# Patient Record
Sex: Female | Born: 1950
Health system: Southern US, Community
[De-identification: ages and names within clinical notes are randomized; demographics above are authoritative.]

## PROBLEM LIST (undated history)

## (undated) DIAGNOSIS — R51 Headache: Secondary | ICD-10-CM

## (undated) DIAGNOSIS — F329 Major depressive disorder, single episode, unspecified: Secondary | ICD-10-CM

## (undated) DIAGNOSIS — D649 Anemia, unspecified: Secondary | ICD-10-CM

## (undated) DIAGNOSIS — F32A Depression, unspecified: Secondary | ICD-10-CM

## (undated) DIAGNOSIS — I471 Supraventricular tachycardia, unspecified: Secondary | ICD-10-CM

## (undated) DIAGNOSIS — R011 Cardiac murmur, unspecified: Secondary | ICD-10-CM

## (undated) DIAGNOSIS — R5382 Chronic fatigue, unspecified: Secondary | ICD-10-CM

## (undated) DIAGNOSIS — E559 Vitamin D deficiency, unspecified: Secondary | ICD-10-CM

## (undated) DIAGNOSIS — E890 Postprocedural hypothyroidism: Secondary | ICD-10-CM

## (undated) DIAGNOSIS — R519 Headache, unspecified: Secondary | ICD-10-CM

## (undated) DIAGNOSIS — D333 Benign neoplasm of cranial nerves: Secondary | ICD-10-CM

## (undated) DIAGNOSIS — E041 Nontoxic single thyroid nodule: Secondary | ICD-10-CM

## (undated) DIAGNOSIS — I10 Essential (primary) hypertension: Secondary | ICD-10-CM

## (undated) DIAGNOSIS — I499 Cardiac arrhythmia, unspecified: Secondary | ICD-10-CM

## (undated) DIAGNOSIS — K219 Gastro-esophageal reflux disease without esophagitis: Secondary | ICD-10-CM

## (undated) DIAGNOSIS — R002 Palpitations: Secondary | ICD-10-CM

## (undated) DIAGNOSIS — M1990 Unspecified osteoarthritis, unspecified site: Secondary | ICD-10-CM

## (undated) DIAGNOSIS — G9332 Myalgic encephalomyelitis/chronic fatigue syndrome: Secondary | ICD-10-CM

## (undated) DIAGNOSIS — E0590 Thyrotoxicosis, unspecified without thyrotoxic crisis or storm: Secondary | ICD-10-CM

## (undated) DIAGNOSIS — C44111 Basal cell carcinoma of skin of unspecified eyelid, including canthus: Secondary | ICD-10-CM

## (undated) DIAGNOSIS — E538 Deficiency of other specified B group vitamins: Secondary | ICD-10-CM

## (undated) HISTORY — PX: GASTRIC BYPASS: SHX52

## (undated) HISTORY — DX: Thyrotoxicosis, unspecified without thyrotoxic crisis or storm: E05.90

## (undated) HISTORY — DX: Benign neoplasm of cranial nerves: D33.3

## (undated) HISTORY — DX: Supraventricular tachycardia, unspecified: I47.10

## (undated) HISTORY — DX: Postprocedural hypothyroidism: E89.0

## (undated) HISTORY — PX: REDUCTION MAMMAPLASTY: SUR839

## (undated) HISTORY — DX: Chronic fatigue, unspecified: R53.82

## (undated) HISTORY — DX: Palpitations: R00.2

## (undated) HISTORY — DX: Myalgic encephalomyelitis/chronic fatigue syndrome: G93.32

## (undated) HISTORY — DX: Essential (primary) hypertension: I10

## (undated) HISTORY — DX: Nontoxic single thyroid nodule: E04.1

## (undated) HISTORY — DX: Vitamin D deficiency, unspecified: E55.9

## (undated) HISTORY — DX: Basal cell carcinoma of skin of unspecified eyelid, including canthus: C44.111

## (undated) HISTORY — DX: Deficiency of other specified B group vitamins: E53.8

## (undated) HISTORY — PX: EYE SURGERY: SHX253

## (undated) HISTORY — DX: Supraventricular tachycardia: I47.1

---

## 1988-05-08 HISTORY — PX: BREAST REDUCTION SURGERY: SHX8

## 2003-01-14 LAB — HM COLONOSCOPY

## 2006-07-20 ENCOUNTER — Ambulatory Visit: Payer: Self-pay | Admitting: Urology

## 2006-12-19 ENCOUNTER — Ambulatory Visit: Payer: Self-pay | Admitting: Obstetrics and Gynecology

## 2007-12-17 ENCOUNTER — Ambulatory Visit: Payer: Self-pay | Admitting: Obstetrics and Gynecology

## 2007-12-20 ENCOUNTER — Ambulatory Visit: Payer: Self-pay | Admitting: Obstetrics and Gynecology

## 2008-10-07 ENCOUNTER — Ambulatory Visit: Payer: Self-pay | Admitting: Gastroenterology

## 2009-04-06 ENCOUNTER — Ambulatory Visit: Payer: Self-pay | Admitting: Obstetrics and Gynecology

## 2009-05-14 ENCOUNTER — Ambulatory Visit: Payer: Self-pay | Admitting: Internal Medicine

## 2009-07-06 HISTORY — PX: LAPAROSCOPIC HYSTERECTOMY: SHX1926

## 2010-01-11 ENCOUNTER — Ambulatory Visit: Payer: Self-pay | Admitting: Internal Medicine

## 2010-06-03 ENCOUNTER — Encounter: Payer: Self-pay | Admitting: Cardiovascular Disease

## 2010-06-07 ENCOUNTER — Ambulatory Visit: Payer: Self-pay | Admitting: Internal Medicine

## 2010-06-15 ENCOUNTER — Ambulatory Visit: Payer: Self-pay | Admitting: Internal Medicine

## 2010-08-04 ENCOUNTER — Ambulatory Visit: Payer: Self-pay | Admitting: Unknown Physician Specialty

## 2010-09-12 ENCOUNTER — Encounter: Payer: Self-pay | Admitting: Cardiovascular Disease

## 2010-09-12 ENCOUNTER — Ambulatory Visit: Payer: Self-pay | Admitting: Unknown Physician Specialty

## 2010-09-12 DIAGNOSIS — I10 Essential (primary) hypertension: Secondary | ICD-10-CM

## 2010-09-19 ENCOUNTER — Ambulatory Visit (INDEPENDENT_AMBULATORY_CARE_PROVIDER_SITE_OTHER): Payer: BC Managed Care – PPO | Admitting: Cardiovascular Disease

## 2010-09-19 ENCOUNTER — Encounter: Payer: Self-pay | Admitting: Cardiovascular Disease

## 2010-09-19 VITALS — BP 120/90 | HR 60 | Ht 68.0 in | Wt 175.0 lb

## 2010-09-19 DIAGNOSIS — R9431 Abnormal electrocardiogram [ECG] [EKG]: Secondary | ICD-10-CM

## 2010-09-19 DIAGNOSIS — Z01810 Encounter for preprocedural cardiovascular examination: Secondary | ICD-10-CM | POA: Insufficient documentation

## 2010-09-19 NOTE — Assessment & Plan Note (Signed)
We have reviewed all of her risk factors. She is not a diabetic, no smoking, no significant family history of coronary artery disease. EKG today is normal. She would be acceptable risk. No further testing is needed prior to hysterectomy surgery.

## 2010-09-19 NOTE — Patient Instructions (Signed)
You are doing well. No medication changes were made.  Please call us if you have new issues that need to be addressed before your next appt.  Follow up as needed   

## 2010-09-19 NOTE — Assessment & Plan Note (Signed)
We have reviewed the previous EKG and have repeated her EKG today. Previous concern for old anterior infarct is likely secondary to lead placement. Repeat EKG today is essentially normal with improved R wave progression. She has no other risk factors. She would be an acceptable risk for her upcoming hysterectomy.

## 2010-09-19 NOTE — Progress Notes (Signed)
   Patient ID: Barbara Mclaughlin, female    DOB: 08-14-1950, 60 y.o.   MRN: [id number]  HPI Comments: Barbara Mclaughlin is a very pleasant 60 year old woman, patient of Dr. Dan Humphreys, with history of hypertension, who presents for preoperative evaluation prior to hysterectomy.  She reports that she had a preoperative EKG that was apparently normal. She otherwise feels well, has no significant shortness of breath, no chest pain. She's never had cardiac issues in the past. She reports that her blood pressure has been well-controlled on her current medication management. He denies any significant lower extremity edema.   We did discuss her risk factors. She is not diabetic, she does not have a smoking history. In terms of her family history, she reports that her mother lived into her 85s, father died of COPD in his 13s, siblings are in good health with no underlying coronary artery disease.  EKG done today shows normal sinus rhythm with rate 60 beats per minute with no significant ST or T wave changes.  EKG done in the hospital showed normal sinus rhythm with rate 56 beats per minute with poor R-wave progression through the precordial leads. EKG was read as unable to rule out anterior infarct. Given the improved R wave progression through the precordial leads on today's EKG, this is likely secondary to lead placement.     Review of Systems  Constitutional: Negative.   HENT: Negative.   Eyes: Negative.   Respiratory: Negative.   Cardiovascular: Negative.   Gastrointestinal: Negative.   Musculoskeletal: Negative.   Skin: Negative.   Neurological: Negative.   Hematological: Negative.   Psychiatric/Behavioral: Negative.   All other systems reviewed and are negative.    BP 120/90  Pulse 60  Ht 5\' 8"  (1.727 m)  Wt 175 lb (79.379 kg)  BMI 26.61 kg/m2 She did not take her blood pressure medication today.  Physical Exam  Nursing note and vitals reviewed. Constitutional: She is oriented to  person, place, and time. She appears well-developed and well-nourished.  HENT:  Head: Normocephalic.  Nose: Nose normal.  Mouth/Throat: Oropharynx is clear and moist.  Eyes: Conjunctivae are normal. Pupils are equal, round, and reactive to light.  Neck: Normal range of motion. Neck supple. No JVD present.  Cardiovascular: Normal rate, regular rhythm, S1 normal, S2 normal, normal heart sounds and intact distal pulses.  Exam reveals no gallop and no friction rub.   No murmur heard. Pulmonary/Chest: Effort normal and breath sounds normal. No respiratory distress. She has no wheezes. She has no rales. She exhibits no tenderness.  Abdominal: Soft. Bowel sounds are normal. She exhibits no distension. There is no tenderness.  Musculoskeletal: Normal range of motion. She exhibits no edema and no tenderness.  Lymphadenopathy:    She has no cervical adenopathy.  Neurological: She is alert and oriented to person, place, and time. Coordination normal.  Skin: Skin is warm and dry. No rash noted. No erythema.  Psychiatric: She has a normal mood and affect. Her behavior is normal. Judgment and thought content normal.         Assessment and Plan

## 2010-09-30 ENCOUNTER — Ambulatory Visit: Payer: Self-pay | Admitting: Unknown Physician Specialty

## 2010-12-15 ENCOUNTER — Encounter: Payer: Self-pay | Admitting: Internal Medicine

## 2010-12-30 ENCOUNTER — Other Ambulatory Visit: Payer: Self-pay | Admitting: Internal Medicine

## 2010-12-30 NOTE — Telephone Encounter (Signed)
Pt needs refill on celbrex      [facility] @ [address]   Please advise pt when rx is sent

## 2010-12-31 MED ORDER — CELECOXIB 100 MG PO CAPS: 100.0000 mg | ORAL_CAPSULE | Freq: Every day | ORAL | Status: DC

## 2011-01-04 ENCOUNTER — Other Ambulatory Visit: Payer: Self-pay | Admitting: Internal Medicine

## 2011-01-04 DIAGNOSIS — M1990 Unspecified osteoarthritis, unspecified site: Secondary | ICD-10-CM

## 2011-01-04 MED ORDER — CELECOXIB 100 MG PO CAPS: 100.0000 mg | ORAL_CAPSULE | Freq: Every day | ORAL | Status: DC

## 2011-01-04 NOTE — Telephone Encounter (Signed)
Rx sent to [facility] for Celebrex

## 2011-01-26 ENCOUNTER — Telehealth: Payer: Self-pay | Admitting: Internal Medicine

## 2011-01-26 NOTE — Telephone Encounter (Signed)
Patient picked up samples

## 2011-01-26 NOTE — Telephone Encounter (Signed)
Pt would like to know if we have any samples of micardis 40mg   Please call pt to let her know if have any samples

## 2011-01-26 NOTE — Telephone Encounter (Signed)
Fine to give month supply of Micardis 40mg  daily

## 2011-02-08 ENCOUNTER — Other Ambulatory Visit: Payer: Self-pay | Admitting: Internal Medicine

## 2011-03-07 ENCOUNTER — Other Ambulatory Visit: Payer: Self-pay | Admitting: Internal Medicine

## 2011-03-08 ENCOUNTER — Other Ambulatory Visit: Payer: Self-pay | Admitting: Internal Medicine

## 2011-03-11 ENCOUNTER — Other Ambulatory Visit: Payer: Self-pay | Admitting: Internal Medicine

## 2011-03-15 ENCOUNTER — Telehealth: Payer: Self-pay | Admitting: *Deleted

## 2011-03-15 NOTE — Telephone Encounter (Signed)
Pt left VM - Pain med denied by MD. I called [facility]. They did not receive the RX that I faxed in on Saturday, I called in RF and left pt vm that we automatically deny the RF due it being a controlled substance and send note to pharm that it would be called or faxed in, they only relayed to the patient that it was denied.

## 2011-04-17 ENCOUNTER — Ambulatory Visit (INDEPENDENT_AMBULATORY_CARE_PROVIDER_SITE_OTHER): Payer: BC Managed Care – HMO | Admitting: Internal Medicine

## 2011-04-17 ENCOUNTER — Encounter: Payer: Self-pay | Admitting: Internal Medicine

## 2011-04-17 VITALS — BP 122/76 | HR 64 | Temp 98.2°F | Ht 68.0 in | Wt 183.0 lb

## 2011-04-17 DIAGNOSIS — M129 Arthropathy, unspecified: Secondary | ICD-10-CM

## 2011-04-17 DIAGNOSIS — Z23 Encounter for immunization: Secondary | ICD-10-CM

## 2011-04-17 DIAGNOSIS — M1990 Unspecified osteoarthritis, unspecified site: Secondary | ICD-10-CM | POA: Insufficient documentation

## 2011-04-17 DIAGNOSIS — I10 Essential (primary) hypertension: Secondary | ICD-10-CM

## 2011-04-17 DIAGNOSIS — E663 Overweight: Secondary | ICD-10-CM | POA: Insufficient documentation

## 2011-04-17 DIAGNOSIS — N959 Unspecified menopausal and perimenopausal disorder: Secondary | ICD-10-CM

## 2011-04-17 DIAGNOSIS — K219 Gastro-esophageal reflux disease without esophagitis: Secondary | ICD-10-CM

## 2011-04-17 DIAGNOSIS — F329 Major depressive disorder, single episode, unspecified: Secondary | ICD-10-CM | POA: Insufficient documentation

## 2011-04-17 DIAGNOSIS — Z2911 Encounter for prophylactic immunotherapy for respiratory syncytial virus (RSV): Secondary | ICD-10-CM

## 2011-04-17 LAB — COMPREHENSIVE METABOLIC PANEL
Albumin: 4 g/dL (ref 3.5–5.2)
CO2: 27 mEq/L (ref 19–32)
GFR: 96.66 mL/min (ref >60.00)
Glucose, Bld: 96 mg/dL (ref 70–99)
Potassium: 4 mEq/L (ref 3.5–5.1)
Sodium: 137 mEq/L (ref 135–145)
Total Protein: 6.8 g/dL (ref 6.0–8.3)

## 2011-04-17 MED ORDER — EST ESTROGENS-METHYLTEST 0.625-1.25 MG PO TABS: 1.0000 | ORAL_TABLET | Freq: Every day | ORAL | Status: DC

## 2011-04-17 MED ORDER — PHENTERMINE HCL 37.5 MG PO CAPS: 37.5000 mg | ORAL_CAPSULE | ORAL | Status: DC

## 2011-04-17 MED ORDER — HYDROCODONE-ACETAMINOPHEN 5-325 MG PO TABS: 1.0000 | ORAL_TABLET | Freq: Two times a day (BID) | ORAL | Status: DC

## 2011-04-17 MED ORDER — TELMISARTAN 40 MG PO TABS: 40.0000 mg | ORAL_TABLET | Freq: Every day | ORAL | Status: DC

## 2011-04-17 MED ORDER — ESTRADIOL 2 MG VA RING: 2.0000 mg | VAGINAL_RING | VAGINAL | Status: DC

## 2011-04-17 MED ORDER — CELECOXIB 100 MG PO CAPS: 100.0000 mg | ORAL_CAPSULE | Freq: Every day | ORAL | Status: DC

## 2011-04-17 MED ORDER — FLUOXETINE HCL 20 MG PO CAPS: 40.0000 mg | ORAL_CAPSULE | Freq: Two times a day (BID) | ORAL | Status: DC

## 2011-04-17 MED ORDER — ESOMEPRAZOLE MAGNESIUM 40 MG PO CPDR: 40.0000 mg | DELAYED_RELEASE_CAPSULE | Freq: Every day | ORAL | Status: DC

## 2011-04-17 NOTE — Progress Notes (Signed)
Subjective:    Patient ID: Barbara Mclaughlin, female    DOB: [birth year], 60 y.o.   MRN: [id number]  HPI 60 year old female with history of hypertension presents for followup. She notes that recently she has been having issues with her arthritis. She notes significant pain in her left shoulder and in both of her knees. She has been followed by orthopedics for this and had a steroid injection into her left shoulder with some improvement. She is also scheduled for evaluation for possible knee replacement. She has been using Celebrex with minimal improvement in her symptoms. She notes that her significant arthritis pain limits her ability to exercise.  In regards to her hypertension, she reports full compliance with her medication. She denies any headache, palpitations, or chest pain.  She notes that over the last 6 months she has gained approximately 10 pounds. She attributes this to her inactivity in the setting of severe arthritis pain. Notably, she underwent a gastric bypass surgery several years ago. She has not been keeping a food diary. She does limit her caloric intake.  Outpatient Encounter Prescriptions as of 04/17/2011  Medication Sig Dispense Refill  . bisacodyl (BISACODYL) 5 MG EC tablet Take 5 mg by mouth daily as needed.        . celecoxib (CELEBREX) 100 MG capsule Take 1 capsule (100 mg total) by mouth daily.  90 capsule  3  . esomeprazole (NEXIUM) 40 MG capsule Take 1 capsule (40 mg total) by mouth daily before breakfast.  90 capsule  3  . estradiol (ESTRING) 2 MG vaginal ring Place 2 mg vaginally every 3 (three) months. follow package directions  1 each  3  . estrogen-methylTESTOSTERone (COVARYX HS) 0.625-1.25 MG per tablet Take 1 tablet by mouth daily.  90 tablet  1  . FLUoxetine (PROZAC) 20 MG capsule Take 2 capsules (40 mg total) by mouth 2 (two) times daily.  180 capsule  3  . HYDROcodone-acetaminophen (NORCO) 5-325 MG per tablet Take 1-2 tablets by mouth 2 (two) times daily.   270 tablet  1  . telmisartan (MICARDIS) 40 MG tablet Take 1 tablet (40 mg total) by mouth daily.  90 tablet  3  . phentermine 37.5 MG capsule Take 1 capsule (37.5 mg total) by mouth every morning.  30 capsule  1    Review of Systems  Constitutional: Negative for fever, chills, appetite change, fatigue and unexpected weight change.  HENT: Negative for ear pain, congestion, sore throat, trouble swallowing, neck pain, voice change and sinus pressure.   Eyes: Negative for visual disturbance.  Respiratory: Negative for cough, shortness of breath, wheezing and stridor.   Cardiovascular: Negative for chest pain, palpitations and leg swelling.  Gastrointestinal: Negative for nausea, vomiting, abdominal pain, diarrhea, constipation and abdominal distention.  Genitourinary: Negative for dysuria and flank pain.  Musculoskeletal: Positive for myalgias, back pain and arthralgias. Negative for gait problem.  Skin: Negative for color change and rash.  Neurological: Negative for dizziness and headaches.  Hematological: Negative for adenopathy. Does not bruise/bleed easily.  Psychiatric/Behavioral: Negative for suicidal ideas, sleep disturbance and dysphoric mood. The patient is not nervous/anxious.    BP 122/76  Pulse 64  Temp(Src) 98.2 F (36.8 C) (Oral)  Ht 5\' 8"  (1.727 m)  Wt 183 lb (83.008 kg)  BMI 27.82 kg/m2  SpO2 97%     Objective:   Physical Exam  Constitutional: She is oriented to person, place, and time. She appears well-developed and well-nourished. No distress.  HENT:  Head: Normocephalic  and atraumatic.  Right Ear: External ear normal.  Left Ear: External ear normal.  Nose: Nose normal.  Mouth/Throat: Oropharynx is clear and moist. No oropharyngeal exudate.  Eyes: Conjunctivae are normal. Pupils are equal, round, and reactive to light. Right eye exhibits no discharge. Left eye exhibits no discharge. No scleral icterus.  Neck: Normal range of motion. Neck supple. No tracheal  deviation present. No thyromegaly present.  Cardiovascular: Normal rate, regular rhythm, normal heart sounds and intact distal pulses.  Exam reveals no gallop and no friction rub.   No murmur heard. Pulmonary/Chest: Effort normal and breath sounds normal. No respiratory distress. She has no wheezes. She has no rales. She exhibits no tenderness.  Musculoskeletal: She exhibits no edema and no tenderness.       Left shoulder: She exhibits tenderness and pain.       Right knee: She exhibits decreased range of motion and swelling.  Lymphadenopathy:    She has no cervical adenopathy.  Neurological: She is alert and oriented to person, place, and time. No cranial nerve deficit. She exhibits normal muscle tone. Coordination normal.  Skin: Skin is warm and dry. No rash noted. She is not diaphoretic. No erythema. No pallor.  Psychiatric: She has a normal mood and affect. Her behavior is normal. Judgment and thought content normal.          Assessment & Plan:  1. Hypertension - blood pressure well-controlled on Micardis. Will continue. Will repeat renal function with labs today.  2. Weight gain/Overweight - BMI 27. Encouraged patient to keep a food diary. Encouraged her to try exercises such as swimming which would not be so hard on her joints. We will give her a trial of phentermine to help with appetite suppression. We discussed the potential risk of palpitations and elevated blood pressure. She will call if any questions or concerns. She will followup in one month.  3. Osteoarthritis - she will continue to follow with orthopedics. She will continue Celebrex. Followup in one month.

## 2011-04-21 ENCOUNTER — Other Ambulatory Visit: Payer: Self-pay | Admitting: Internal Medicine

## 2011-04-21 ENCOUNTER — Telehealth: Payer: Self-pay | Admitting: *Deleted

## 2011-04-21 NOTE — Telephone Encounter (Signed)
Pt left VM - [facility] did not received RFs. I refaxed w/cover sheet, need to call patient to inform

## 2011-04-24 MED ORDER — HYDROCODONE-ACETAMINOPHEN 5-325 MG PO TABS: 1.0000 | ORAL_TABLET | Freq: Three times a day (TID) | ORAL | Status: DC | PRN

## 2011-04-24 NOTE — Telephone Encounter (Signed)
That is fine 

## 2011-04-24 NOTE — Telephone Encounter (Signed)
Patient informed, She will be out of hydrocodone before mail order can deliver. OK to call in 14 day supply to local pharm?

## 2011-04-24 NOTE — Telephone Encounter (Signed)
Called into [facility]

## 2011-04-28 ENCOUNTER — Telehealth: Payer: Self-pay | Admitting: *Deleted

## 2011-04-28 NOTE — Telephone Encounter (Signed)
Pt left VM - [facility] did not get RX's that were faxed twice. Need to call pharm Monday and call in RX's

## 2011-05-04 NOTE — Telephone Encounter (Signed)
Called in [place], spent 30 min on phone w/[facility]. Patient informed

## 2011-05-09 HISTORY — PX: TOTAL KNEE ARTHROPLASTY: SHX125

## 2011-05-24 ENCOUNTER — Ambulatory Visit (INDEPENDENT_AMBULATORY_CARE_PROVIDER_SITE_OTHER): Payer: BC Managed Care – HMO | Admitting: Internal Medicine

## 2011-05-24 ENCOUNTER — Encounter: Payer: Self-pay | Admitting: Internal Medicine

## 2011-05-24 DIAGNOSIS — E663 Overweight: Secondary | ICD-10-CM

## 2011-05-24 DIAGNOSIS — M1990 Unspecified osteoarthritis, unspecified site: Secondary | ICD-10-CM

## 2011-05-24 DIAGNOSIS — M129 Arthropathy, unspecified: Secondary | ICD-10-CM

## 2011-05-24 NOTE — Progress Notes (Signed)
Subjective:    Patient ID: Barbara Mclaughlin, female    DOB: Jul 06, 1950, 61 y.o.   MRN: [id number]  HPI 61 year old female with a history of osteoarthritis and obesity presents for followup. In regards to her osteoarthritis, she reports that she is scheduled for right knee replacement later this month. She reports that, at present her pain is well controlled using Celebrex and hydrocodone.  In regards to her obesity, she is status post gastric bypass. Over the last several months she has gained some weight back secondary to being more sedentary as she is limited with her osteoarthritis. Last month, we restarted phentermine to help with appetite suppression. Over the last month, she reports improvement in her appetite. She denies any known side effects from the medication. She denies headache or palpitations. She has lost 1 pound since her last visit.  Outpatient Encounter Prescriptions as of 05/24/2011  Medication Sig Dispense Refill  . bisacodyl (BISACODYL) 5 MG EC tablet Take 5 mg by mouth daily as needed.        . celecoxib (CELEBREX) 100 MG capsule Take 1 capsule (100 mg total) by mouth daily.  90 capsule  3  . esomeprazole (NEXIUM) 40 MG capsule Take 1 capsule (40 mg total) by mouth daily before breakfast.  90 capsule  3  . estradiol (ESTRING) 2 MG vaginal ring Place 2 mg vaginally every 3 (three) months. follow package directions  1 each  3  . estrogen-methylTESTOSTERone (COVARYX HS) 0.625-1.25 MG per tablet Take 1 tablet by mouth daily.  90 tablet  1  . FLUoxetine (PROZAC) 20 MG capsule Take 2 capsules (40 mg total) by mouth 2 (two) times daily.  180 capsule  3  . HYDROcodone-acetaminophen (NORCO) 5-325 MG per tablet Take 1 tablet by mouth 3 (three) times daily as needed.  45 tablet  0  . phentermine 37.5 MG capsule Take 37.5 mg by mouth every morning.      Marland Kitchen telmisartan (MICARDIS) 40 MG tablet Take 1 tablet (40 mg total) by mouth daily.  90 tablet  3    Review of Systems    Constitutional: Negative for fever, chills, appetite change, fatigue and unexpected weight change.  HENT: Negative for ear pain, congestion, sore throat, trouble swallowing, neck pain, voice change and sinus pressure.   Eyes: Negative for visual disturbance.  Respiratory: Negative for cough, shortness of breath, wheezing and stridor.   Cardiovascular: Negative for chest pain, palpitations and leg swelling.  Gastrointestinal: Negative for nausea, vomiting, abdominal pain, diarrhea, constipation, blood in stool, abdominal distention and anal bleeding.  Genitourinary: Negative for dysuria and flank pain.  Musculoskeletal: Negative for myalgias, arthralgias and gait problem.  Skin: Negative for color change and rash.  Neurological: Negative for dizziness and headaches.  Hematological: Negative for adenopathy. Does not bruise/bleed easily.  Psychiatric/Behavioral: Negative for suicidal ideas, sleep disturbance and dysphoric mood. The patient is not nervous/anxious.    BP 140/82  Pulse 85  Temp(Src) 97.7 F (36.5 C) (Oral)  Ht 5\' 8"  (1.727 m)  Wt 182 lb (82.555 kg)  BMI 27.67 kg/m2  SpO2 97%     Objective:   Physical Exam  Constitutional: She is oriented to person, place, and time. She appears well-developed and well-nourished. No distress.  HENT:  Head: Normocephalic and atraumatic.  Right Ear: External ear normal.  Left Ear: External ear normal.  Nose: Nose normal.  Mouth/Throat: Oropharynx is clear and moist. No oropharyngeal exudate.  Eyes: Conjunctivae are normal. Pupils are equal, round, and reactive  to light. Right eye exhibits no discharge. Left eye exhibits no discharge. No scleral icterus.  Neck: Normal range of motion. Neck supple. No tracheal deviation present. No thyromegaly present.  Cardiovascular: Normal rate, regular rhythm, normal heart sounds and intact distal pulses.  Exam reveals no gallop and no friction rub.   No murmur heard. Pulmonary/Chest: Effort normal and  breath sounds normal. No respiratory distress. She has no wheezes. She has no rales. She exhibits no tenderness.  Musculoskeletal: Normal range of motion. She exhibits no edema and no tenderness.  Lymphadenopathy:    She has no cervical adenopathy.  Neurological: She is alert and oriented to person, place, and time. No cranial nerve deficit. She exhibits normal muscle tone. Coordination normal.  Skin: Skin is warm and dry. No rash noted. She is not diaphoretic. No erythema. No pallor.  Psychiatric: She has a normal mood and affect. Her behavior is normal. Judgment and thought content normal.          Assessment & Plan:  1. Overweight - Some improvement in appetite on phentermine. Lost 1lb since last month. BP slightly higher today. Will continue phentermine 1 month longer and recheck BP in 1 month. Goal weight loss 1lb per week.  2. Osteoarthritis - Pt is scheduled for knee replacement this month.  She would be considered low perioperative risk for cardiovascular events.

## 2011-06-19 ENCOUNTER — Ambulatory Visit: Payer: Self-pay | Admitting: General Practice

## 2011-06-19 LAB — URINALYSIS, COMPLETE
Bacteria: NONE SEEN
Bilirubin,UR: NEGATIVE
Blood: NEGATIVE
Nitrite: NEGATIVE
Ph: 7 (ref 4.5–8.0)
Squamous Epithelial: 3

## 2011-06-19 LAB — CBC
HCT: 36.9 % (ref 35.0–47.0)
HGB: 12 g/dL (ref 12.0–16.0)
MCH: 29.9 pg (ref 26.0–34.0)
MCHC: 32.6 g/dL (ref 32.0–36.0)
MCV: 92 fL (ref 80–100)
RDW: 12.2 % (ref 11.5–14.5)
WBC: 6.2 10*3/uL (ref 3.6–11.0)

## 2011-06-19 LAB — MRSA PCR SCREENING

## 2011-06-19 LAB — BASIC METABOLIC PANEL
Anion Gap: 4 — ABNORMAL LOW (ref 7–16)
Calcium, Total: 9.1 mg/dL (ref 8.5–10.1)
EGFR (African American): >60
EGFR (Non-African Amer.): >60
Glucose: 94 mg/dL (ref 65–99)
Osmolality: 273 (ref 275–301)
Potassium: 3.8 mmol/L (ref 3.5–5.1)
Sodium: 137 mmol/L (ref 136–145)

## 2011-06-19 LAB — PROTIME-INR: INR: 0.9

## 2011-06-20 LAB — URINE CULTURE

## 2011-07-05 ENCOUNTER — Inpatient Hospital Stay: Payer: Self-pay | Admitting: General Practice

## 2011-07-06 LAB — BASIC METABOLIC PANEL
Anion Gap: 11 (ref 7–16)
BUN: 10 mg/dL (ref 7–18)
Calcium, Total: 8 mg/dL — ABNORMAL LOW (ref 8.5–10.1)
Chloride: 100 mmol/L (ref 98–107)
Co2: 28 mmol/L (ref 21–32)
EGFR (African American): >60
Osmolality: 276 (ref 275–301)
Potassium: 3.6 mmol/L (ref 3.5–5.1)

## 2011-07-06 LAB — HEMOGLOBIN: HGB: 8.4 g/dL — ABNORMAL LOW (ref 12.0–16.0)

## 2011-07-06 LAB — PLATELET COUNT: Platelet: 185 10*3/uL (ref 150–440)

## 2011-07-07 LAB — BASIC METABOLIC PANEL
Anion Gap: 6 — ABNORMAL LOW (ref 7–16)
Calcium, Total: 8.3 mg/dL — ABNORMAL LOW (ref 8.5–10.1)
Chloride: 106 mmol/L (ref 98–107)
Co2: 30 mmol/L (ref 21–32)
Creatinine: 0.67 mg/dL (ref 0.60–1.30)
EGFR (African American): >60
Osmolality: 281 (ref 275–301)

## 2011-08-16 ENCOUNTER — Other Ambulatory Visit: Payer: Self-pay | Admitting: Internal Medicine

## 2011-08-16 NOTE — Telephone Encounter (Signed)
This is a Dr. Dan Humphreys patient

## 2011-09-05 ENCOUNTER — Other Ambulatory Visit: Payer: Self-pay | Admitting: *Deleted

## 2011-09-05 NOTE — Telephone Encounter (Signed)
Fine to refill, same instructions

## 2011-09-05 NOTE — Telephone Encounter (Signed)
Request for Hydrocodone-APAP 5/325 [last refill 12.17.12 #[id number] to local pharmacy  Last OV 01.16.13] Please advise.

## 2011-09-06 MED ORDER — HYDROCODONE-ACETAMINOPHEN 5-325 MG PO TABS: 1.0000 | ORAL_TABLET | Freq: Three times a day (TID) | ORAL | Status: DC | PRN

## 2011-09-06 NOTE — Telephone Encounter (Signed)
Rx Done to [facility] in Error; printed for fax to [facility], patient informed/SLS

## 2011-11-13 ENCOUNTER — Telehealth: Payer: Self-pay | Admitting: *Deleted

## 2011-11-13 ENCOUNTER — Other Ambulatory Visit (INDEPENDENT_AMBULATORY_CARE_PROVIDER_SITE_OTHER): Payer: BC Managed Care – HMO | Admitting: *Deleted

## 2011-11-13 DIAGNOSIS — Z0000 Encounter for general adult medical examination without abnormal findings: Secondary | ICD-10-CM

## 2011-11-13 LAB — CBC WITH DIFFERENTIAL/PLATELET
Basophils Absolute: 0.1 10*3/uL (ref 0.0–0.1)
Basophils Relative: 1.5 % (ref 0.0–3.0)
Eosinophils Absolute: 0 10*3/uL (ref 0.0–0.7)
HCT: 34.1 % — ABNORMAL LOW (ref 36.0–46.0)
Hemoglobin: 11.1 g/dL — ABNORMAL LOW (ref 12.0–15.0)
Lymphocytes Relative: 29.4 % (ref 12.0–46.0)
Lymphs Abs: 1.4 10*3/uL (ref 0.7–4.0)
MCHC: 32.6 g/dL (ref 30.0–36.0)
Monocytes Relative: 11.3 % (ref 3.0–12.0)
Neutro Abs: 2.7 10*3/uL (ref 1.4–7.7)
RBC: 4.07 Mil/uL (ref 3.87–5.11)
RDW: 17.8 % — ABNORMAL HIGH (ref 11.5–14.6)

## 2011-11-13 LAB — COMPREHENSIVE METABOLIC PANEL
ALT: 15 U/L (ref 0–35)
BUN: 13 mg/dL (ref 6–23)
CO2: 28 mEq/L (ref 19–32)
Calcium: 9.3 mg/dL (ref 8.4–10.5)
Chloride: 104 mEq/L (ref 96–112)
Creatinine, Ser: 0.6 mg/dL (ref 0.4–1.2)
GFR: 116.62 mL/min (ref >60.00)
Total Bilirubin: 0.5 mg/dL (ref 0.3–1.2)

## 2011-11-13 LAB — LIPID PANEL
HDL: 56.5 mg/dL (ref >39.00)
Triglycerides: 104 mg/dL (ref 0.0–149.0)
VLDL: 20.8 mg/dL (ref 0.0–40.0)

## 2011-11-13 NOTE — Telephone Encounter (Signed)
Patient came in for lab today, what labs would you like for me to order?

## 2011-11-13 NOTE — Telephone Encounter (Signed)
Lets do a full panel, CBC, CMP, lipids. Thanks

## 2011-11-13 NOTE — Telephone Encounter (Signed)
Patient notified

## 2011-11-20 ENCOUNTER — Ambulatory Visit (INDEPENDENT_AMBULATORY_CARE_PROVIDER_SITE_OTHER): Payer: BC Managed Care – HMO | Admitting: Internal Medicine

## 2011-11-20 ENCOUNTER — Other Ambulatory Visit: Payer: Self-pay | Admitting: Internal Medicine

## 2011-11-20 ENCOUNTER — Telehealth: Payer: Self-pay | Admitting: Internal Medicine

## 2011-11-20 ENCOUNTER — Encounter: Payer: Self-pay | Admitting: Internal Medicine

## 2011-11-20 ENCOUNTER — Other Ambulatory Visit (HOSPITAL_COMMUNITY)
Admission: RE | Admit: 2011-11-20 | Discharge: 2011-11-20 | Disposition: A | Discharge status: Home / Self Care | Payer: BC Managed Care – HMO | Source: Ambulatory Visit | Attending: Internal Medicine | Admitting: Internal Medicine

## 2011-11-20 VITALS — BP 140/82 | HR 72 | Temp 98.1°F | Ht 68.0 in | Wt 177.8 lb

## 2011-11-20 DIAGNOSIS — F329 Major depressive disorder, single episode, unspecified: Secondary | ICD-10-CM

## 2011-11-20 DIAGNOSIS — I10 Essential (primary) hypertension: Secondary | ICD-10-CM

## 2011-11-20 DIAGNOSIS — D509 Iron deficiency anemia, unspecified: Secondary | ICD-10-CM

## 2011-11-20 DIAGNOSIS — G8929 Other chronic pain: Secondary | ICD-10-CM

## 2011-11-20 DIAGNOSIS — F32A Depression, unspecified: Secondary | ICD-10-CM

## 2011-11-20 DIAGNOSIS — Z1159 Encounter for screening for other viral diseases: Secondary | ICD-10-CM | POA: Insufficient documentation

## 2011-11-20 DIAGNOSIS — Z1211 Encounter for screening for malignant neoplasm of colon: Secondary | ICD-10-CM

## 2011-11-20 DIAGNOSIS — Z0000 Encounter for general adult medical examination without abnormal findings: Secondary | ICD-10-CM

## 2011-11-20 DIAGNOSIS — Z23 Encounter for immunization: Secondary | ICD-10-CM

## 2011-11-20 DIAGNOSIS — Z1239 Encounter for other screening for malignant neoplasm of breast: Secondary | ICD-10-CM

## 2011-11-20 DIAGNOSIS — Z01419 Encounter for gynecological examination (general) (routine) without abnormal findings: Secondary | ICD-10-CM | POA: Insufficient documentation

## 2011-11-20 DIAGNOSIS — N959 Unspecified menopausal and perimenopausal disorder: Secondary | ICD-10-CM

## 2011-11-20 HISTORY — DX: Other chronic pain: G89.29

## 2011-11-20 MED ORDER — LOSARTAN POTASSIUM 50 MG PO TABS: 50.0000 mg | ORAL_TABLET | Freq: Every day | ORAL | Status: DC

## 2011-11-20 MED ORDER — DULOXETINE HCL 30 MG PO CPEP: 30.0000 mg | ORAL_CAPSULE | Freq: Every day | ORAL | Status: DC

## 2011-11-20 MED ORDER — ESTRADIOL 2 MG VA RING: 2.0000 mg | VAGINAL_RING | VAGINAL | Status: DC

## 2011-11-20 NOTE — Assessment & Plan Note (Signed)
Patient with atrophic vaginitis which was previously treated with both oral estrogen and topical estrogen preparation. After stopping oral estrogen, she has developed some worsening dyspareunia and occasional bleeding after intercourse. Exam is normal today. Discussed adding back her oral estrogen medication. She would like to hold off on this for now. Also discussed using silicone based lubricants. She will followup if symptoms are persistent.

## 2011-11-20 NOTE — Assessment & Plan Note (Signed)
General medical exam including breast and pelvic exam are normal today. Pap is pending. Lab work is up to date and showed iron deficiency so stool testing for Hemoccult is pending. Will set up mammogram today.

## 2011-11-20 NOTE — Telephone Encounter (Signed)
Left message on cell phone voicemail for patient to return call. 

## 2011-11-20 NOTE — Patient Instructions (Signed)
Continue Prozac x 1 week, then stop. Start Cymbalta 30mg  daily. Follow up 1 month.

## 2011-11-20 NOTE — Assessment & Plan Note (Signed)
Patient has chronic pain in her left shoulder. Will try to get notes from her orthopedic physician to see if this has been evaluated with imaging. We'll also discuss with patient potentially checking blood work for inflammatory arthropathy. Patient is currently using hydrocodone for pain. We discussed the potential risk of this medication. Will try changing to Cymbalta. Patient will followup in one month.

## 2011-11-20 NOTE — Assessment & Plan Note (Signed)
Patient has been on Prozac and doing well, but would like to change to Cymbalta because of potential of improving chronic pain. Will change to Cymbalta. Patient will followup in one month.

## 2011-11-20 NOTE — Telephone Encounter (Signed)
Thinking more about pt chronic shoulder pain.  Has she discussed this with her orthopedic surgeon? And, would she be willing to have some additional blood work to look for rheumatoid arthritis and/or lupus.  If yes, I can order.

## 2011-11-20 NOTE — Assessment & Plan Note (Signed)
Labs are consistent with iron deficiency anemia. This may be secondary to blood loss from recent orthopedic surgery on her left knee. However, stool Hemoccult testing is pending. Will plan to repeat CBC and ferritin in one month.

## 2011-11-20 NOTE — Assessment & Plan Note (Signed)
Patient's blood pressure well-controlled on Micardis however she would like to change to less expensive medication. Will change to losartan. Will recheck her blood pressure in one month.

## 2011-11-20 NOTE — Progress Notes (Signed)
Subjective:    Patient ID: Barbara Mclaughlin, female    DOB: December 05, 1950, 61 y.o.   MRN: [id number]  HPI 61 year old female with history of chronic pain, atrophic vaginitis, and hypertension presents for annual exam. She reports that she stopped using her oral estrogen supplement because her pharmacist told her this was no longer available. After stopping her oral estrogen supplement, she noted some increased pain with intercourse and occasional spotting of blood after sexual intercourse. However, she feels that symptoms of vaginal dryness are fairly well-controlled with use of the Estring.  In regards to her hypertension, she would like to change from Micardis to a less expensive medication. She denies any headache, palpitations, chest pain.  In regards to chronic diffuse joint pain, she reports minimal improvement with Celebrex. She would like to consider using Cymbalta. Currently, she is taking hydrocodone up to 3 times a day for pain in her joints and shoulders. She denies any noted side effects from this medication.  In regards to weight, she reports that she did not feel well when taking phentermine. She stopped this medication. She has been monitoring her diet and has increased her physical activity. She has lost 5 pounds since her last visit.  Outpatient Encounter Prescriptions as of 11/20/2011  Medication Sig Dispense Refill  . bisacodyl (BISACODYL) 5 MG EC tablet Take 5 mg by mouth daily as needed.        Marland Kitchen esomeprazole (NEXIUM) 40 MG capsule Take 1 capsule (40 mg total) by mouth daily before breakfast.  90 capsule  3  . estradiol (ESTRING) 2 MG vaginal ring Place 2 mg vaginally every 3 (three) months. follow package directions  1 each  3  . FLUoxetine (PROZAC) 20 MG capsule Take 2 capsules (40 mg total) by mouth 2 (two) times daily.  180 capsule  3  . HYDROcodone-acetaminophen (NORCO) 5-325 MG per tablet Take 1 tablet by mouth 3 (three) times daily as needed.  270 tablet  0  .  DULoxetine (CYMBALTA) 30 MG capsule Take 1 capsule (30 mg total) by mouth daily.  30 capsule  11  . estrogen-methylTESTOSTERone (COVARYX HS) 0.625-1.25 MG per tablet Take 1 tablet by mouth daily.  90 tablet  1  . losartan (COZAAR) 50 MG tablet Take 1 tablet (50 mg total) by mouth daily.  30 tablet  6    Review of Systems  Constitutional: Negative for fever, chills, appetite change, fatigue and unexpected weight change.  HENT: Negative for ear pain, congestion, sore throat, trouble swallowing, neck pain, voice change and sinus pressure.   Eyes: Negative for visual disturbance.  Respiratory: Negative for cough, shortness of breath, wheezing and stridor.   Cardiovascular: Negative for chest pain, palpitations and leg swelling.  Gastrointestinal: Negative for nausea, vomiting, abdominal pain, diarrhea, constipation, blood in stool, abdominal distention and anal bleeding.  Genitourinary: Positive for vaginal bleeding and vaginal pain. Negative for dysuria, flank pain, vaginal discharge and pelvic pain.  Musculoskeletal: Negative for myalgias, arthralgias and gait problem.  Skin: Negative for color change and rash.  Neurological: Negative for dizziness and headaches.  Hematological: Negative for adenopathy. Does not bruise/bleed easily.  Psychiatric/Behavioral: Negative for suicidal ideas, disturbed wake/sleep cycle and dysphoric mood. The patient is not nervous/anxious.    BP 140/82  Pulse 72  Temp 98.1 F (36.7 C) (Oral)  Ht 5\' 8"  (1.727 m)  Wt 177 lb 12 oz (80.627 kg)  BMI 27.03 kg/m2  SpO2 98%     Objective:   Physical Exam  Constitutional: She is oriented to person, place, and time. She appears well-developed and well-nourished. No distress.  HENT:  Head: Normocephalic and atraumatic.  Right Ear: External ear normal.  Left Ear: External ear normal.  Nose: Nose normal.  Mouth/Throat: Oropharynx is clear and moist. No oropharyngeal exudate.  Eyes: Conjunctivae are normal. Pupils  are equal, round, and reactive to light. Right eye exhibits no discharge. Left eye exhibits no discharge. No scleral icterus.  Neck: Normal range of motion. Neck supple. No tracheal deviation present. No thyromegaly present.  Cardiovascular: Normal rate, regular rhythm, normal heart sounds and intact distal pulses.  Exam reveals no gallop and no friction rub.   No murmur heard. Pulmonary/Chest: Effort normal and breath sounds normal. No accessory muscle usage. Not tachypneic. No respiratory distress. She has no wheezes. She has no rhonchi. She has no rales. She exhibits no tenderness. Right breast exhibits no inverted nipple, no mass, no nipple discharge, no skin change and no tenderness. Left breast exhibits no inverted nipple, no mass, no nipple discharge, no skin change and no tenderness. Breasts are symmetrical.  Abdominal: Soft. Bowel sounds are normal. She exhibits no distension and no mass. There is no tenderness. There is no rebound and no guarding.  Genitourinary: Vagina normal. Pelvic exam was performed with patient prone. No erythema, tenderness or bleeding around the vagina. No foreign body around the vagina. No vaginal discharge found.  Musculoskeletal: Normal range of motion. She exhibits no edema and no tenderness.  Lymphadenopathy:    She has no cervical adenopathy.  Neurological: She is alert and oriented to person, place, and time. No cranial nerve deficit. She exhibits normal muscle tone. Coordination normal.  Skin: Skin is warm and dry. No rash noted. She is not diaphoretic. No erythema. No pallor.  Psychiatric: She has a normal mood and affect. Her behavior is normal. Judgment and thought content normal.          Assessment & Plan:

## 2011-11-21 ENCOUNTER — Other Ambulatory Visit: Payer: BC Managed Care – HMO

## 2011-11-21 DIAGNOSIS — Z1211 Encounter for screening for malignant neoplasm of colon: Secondary | ICD-10-CM

## 2011-11-21 LAB — FECAL OCCULT BLOOD, IMMUNOCHEMICAL: Fecal Occult Bld: NEGATIVE

## 2011-11-22 NOTE — Telephone Encounter (Signed)
Patient advised as instructed via telephone, she stated that she does not have chronic shoulder pain her pain is all over.  She isn't interested in lab work to test for lupus at this time, she will call back to schedule f/u appt when she gets back in town.

## 2011-11-27 ENCOUNTER — Other Ambulatory Visit: Payer: Self-pay | Admitting: *Deleted

## 2011-11-27 DIAGNOSIS — K219 Gastro-esophageal reflux disease without esophagitis: Secondary | ICD-10-CM

## 2011-11-27 DIAGNOSIS — F329 Major depressive disorder, single episode, unspecified: Secondary | ICD-10-CM

## 2011-11-27 MED ORDER — FLUOXETINE HCL 20 MG PO CAPS: 40.0000 mg | ORAL_CAPSULE | Freq: Two times a day (BID) | ORAL | Status: DC

## 2011-11-27 MED ORDER — ESOMEPRAZOLE MAGNESIUM 40 MG PO CPDR: 40.0000 mg | DELAYED_RELEASE_CAPSULE | Freq: Every day | ORAL | Status: DC

## 2011-11-27 NOTE — Telephone Encounter (Signed)
Patient is requesting a refill on Prozac.  She stated that the Rx for Cymbalta was over $100 and the pharmacy will not take it back, she stated that she doesn't want to start the medication because she will not be able to afford to continue to buy it.  Patient is aware that Dr. Dan Humphreys is out of town all week.

## 2011-11-27 NOTE — Telephone Encounter (Signed)
Rx called to [facility] pharmacy

## 2011-12-07 ENCOUNTER — Other Ambulatory Visit: Payer: Self-pay | Admitting: Internal Medicine

## 2011-12-08 NOTE — Telephone Encounter (Signed)
Rx called to [facility] pharmacy.

## 2011-12-21 ENCOUNTER — Ambulatory Visit: Payer: Self-pay | Admitting: Internal Medicine

## 2011-12-27 ENCOUNTER — Encounter: Payer: Self-pay | Admitting: Internal Medicine

## 2012-01-11 ENCOUNTER — Ambulatory Visit (INDEPENDENT_AMBULATORY_CARE_PROVIDER_SITE_OTHER): Payer: BC Managed Care – HMO | Admitting: Internal Medicine

## 2012-01-11 ENCOUNTER — Encounter: Payer: Self-pay | Admitting: Internal Medicine

## 2012-01-11 VITALS — BP 138/80 | HR 62 | Temp 98.7°F | Ht 68.0 in | Wt 179.0 lb

## 2012-01-11 DIAGNOSIS — I10 Essential (primary) hypertension: Secondary | ICD-10-CM

## 2012-01-11 DIAGNOSIS — Z23 Encounter for immunization: Secondary | ICD-10-CM

## 2012-01-11 DIAGNOSIS — G8929 Other chronic pain: Secondary | ICD-10-CM

## 2012-01-11 DIAGNOSIS — F3289 Other specified depressive episodes: Secondary | ICD-10-CM

## 2012-01-11 DIAGNOSIS — F329 Major depressive disorder, single episode, unspecified: Secondary | ICD-10-CM

## 2012-01-11 DIAGNOSIS — K219 Gastro-esophageal reflux disease without esophagitis: Secondary | ICD-10-CM

## 2012-01-11 DIAGNOSIS — D649 Anemia, unspecified: Secondary | ICD-10-CM

## 2012-01-11 DIAGNOSIS — N959 Unspecified menopausal and perimenopausal disorder: Secondary | ICD-10-CM

## 2012-01-11 MED ORDER — CELECOXIB 100 MG PO CAPS: 200.0000 mg | ORAL_CAPSULE | Freq: Every day | ORAL | Status: DC

## 2012-01-11 MED ORDER — FLUOXETINE HCL 20 MG PO CAPS: 20.0000 mg | ORAL_CAPSULE | Freq: Two times a day (BID) | ORAL | Status: DC

## 2012-01-11 MED ORDER — ESOMEPRAZOLE MAGNESIUM 40 MG PO CPDR: 40.0000 mg | DELAYED_RELEASE_CAPSULE | Freq: Every day | ORAL | Status: DC

## 2012-01-11 MED ORDER — ESTRADIOL 2 MG VA RING: 2.0000 mg | VAGINAL_RING | VAGINAL | Status: DC

## 2012-01-11 MED ORDER — HYDROCODONE-ACETAMINOPHEN 5-325 MG PO TABS: 1.0000 | ORAL_TABLET | Freq: Three times a day (TID) | ORAL | Status: DC | PRN

## 2012-01-11 MED ORDER — LOSARTAN POTASSIUM 50 MG PO TABS: 50.0000 mg | ORAL_TABLET | Freq: Every day | ORAL | Status: DC

## 2012-01-11 NOTE — Assessment & Plan Note (Signed)
Patient has resumed therapy with Prozac. Symptoms are well controlled. Will continue.

## 2012-01-11 NOTE — Assessment & Plan Note (Signed)
Patient with chronic pain secondary to arthritis. At last visit, discussed using Cymbalta but this medication was too expensive for her. Discussed using Lyrica today but she would like to hold off for now. Will continue to monitor.

## 2012-01-11 NOTE — Progress Notes (Signed)
Subjective:    Patient ID: Barbara Mclaughlin, female    DOB: [birth year], 61 y.o.   MRN: [id number]  HPI 61 year old female with history of obesity status post gastric bypass, hypertension, chronic pain secondary to arthritis, GERD presents for followup. At her last visit, we tried changing from Prozac to Cymbalta to better help with chronic pain. She ultimately decided not to continue with Cymbalta because of the cost of the medication. She went back on Prozac. She reports that symptoms of depression are well-controlled with Prozac but she continues to have diffuse chronic joint pain. She is not interested in trying new medications for this at this time.  In regards to GERD, she reports that symptoms are generally well controlled with Nexium but on occasion she does have some exacerbation of symptoms with intake of certain foods. This typically resolve with the use of times.  In regards to blood pressure, she notes that occasionally at home she is having blood pressures greater than 140/90. She denies any headache, palpitations, chest pain. She reports full compliance with losartan.  Outpatient Encounter Prescriptions as of 01/11/2012  Medication Sig Dispense Refill  . bisacodyl (BISACODYL) 5 MG EC tablet Take 5 mg by mouth daily as needed.        . celecoxib (CELEBREX) 100 MG capsule Take 2 capsules (200 mg total) by mouth daily.  90 capsule  3  . esomeprazole (NEXIUM) 40 MG capsule Take 1 capsule (40 mg total) by mouth daily before breakfast.  90 capsule  3  . estradiol (ESTRING) 2 MG vaginal ring Place 2 mg vaginally every 3 (three) months. follow package directions  3 each  3  . FLUoxetine (PROZAC) 20 MG capsule Take 1 capsule (20 mg total) by mouth 2 (two) times daily.  180 capsule  3  . HYDROcodone-acetaminophen (NORCO/VICODIN) 5-325 MG per tablet Take 1 tablet by mouth every 8 (eight) hours as needed for pain.  90 tablet  3  . losartan (COZAAR) 50 MG tablet Take 1 tablet (50 mg total) by  mouth daily.  90 tablet  3   BP 138/80  Pulse 62  Temp 98.7 F (37.1 C) (Oral)  Ht 5\' 8"  (1.727 m)  Wt 179 lb (81.194 kg)  BMI 27.22 kg/m2  SpO2 97%  Review of Systems  Constitutional: Negative for fever, chills, appetite change, fatigue and unexpected weight change.  HENT: Negative for ear pain, congestion, sore throat, trouble swallowing, neck pain, voice change and sinus pressure.   Eyes: Negative for visual disturbance.  Respiratory: Negative for cough, shortness of breath, wheezing and stridor.   Cardiovascular: Negative for chest pain, palpitations and leg swelling.  Gastrointestinal: Negative for nausea, vomiting, abdominal pain, diarrhea, constipation, blood in stool, abdominal distention and anal bleeding.  Genitourinary: Negative for dysuria and flank pain.  Musculoskeletal: Positive for myalgias and arthralgias. Negative for gait problem.  Skin: Negative for color change and rash.  Neurological: Negative for dizziness and headaches.  Hematological: Negative for adenopathy. Does not bruise/bleed easily.  Psychiatric/Behavioral: Negative for suicidal ideas, disturbed wake/sleep cycle and dysphoric mood. The patient is not nervous/anxious.        Objective:   Physical Exam  Constitutional: She is oriented to person, place, and time. She appears well-developed and well-nourished. No distress.  HENT:  Head: Normocephalic and atraumatic.  Right Ear: External ear normal.  Left Ear: External ear normal.  Nose: Nose normal.  Mouth/Throat: Oropharynx is clear and moist. No oropharyngeal exudate.  Eyes: Conjunctivae are normal. Pupils  are equal, round, and reactive to light. Right eye exhibits no discharge. Left eye exhibits no discharge. No scleral icterus.  Neck: Normal range of motion. Neck supple. No tracheal deviation present. No thyromegaly present.  Cardiovascular: Normal rate, regular rhythm, normal heart sounds and intact distal pulses.  Exam reveals no gallop and no  friction rub.   No murmur heard. Pulmonary/Chest: Effort normal and breath sounds normal. No respiratory distress. She has no wheezes. She has no rales. She exhibits no tenderness.  Abdominal: Soft. She exhibits no distension. There is no tenderness.  Musculoskeletal: Normal range of motion. She exhibits no edema and no tenderness.  Lymphadenopathy:    She has no cervical adenopathy.  Neurological: She is alert and oriented to person, place, and time. No cranial nerve deficit. She exhibits normal muscle tone. Coordination normal.  Skin: Skin is warm and dry. No rash noted. She is not diaphoretic. No erythema. No pallor.  Psychiatric: She has a normal mood and affect. Her behavior is normal. Judgment and thought content normal.          Assessment & Plan:

## 2012-01-11 NOTE — Assessment & Plan Note (Signed)
Symptoms well controlled with Nexium and occasional use of TUMS. Will continue to monitor.

## 2012-01-11 NOTE — Assessment & Plan Note (Signed)
Blood pressure fairly well-controlled with current medication, losartan 50 mg daily. A few elevated blood pressures at home, so patient will continue to monitor. If blood pressure consistently greater than 140/90, we discussed doubling losartan to 100 mg daily.

## 2012-01-11 NOTE — Assessment & Plan Note (Signed)
The patient was recently noted to have iron deficiency anemia. Stool Hemoccult was negative. Suspect secondary to blood loss from surgery. Will repeat CBC and ferritin today.

## 2012-01-12 LAB — COMPREHENSIVE METABOLIC PANEL
CO2: 24 mEq/L (ref 19–32)
Creatinine, Ser: 0.6 mg/dL (ref 0.4–1.2)
GFR: 103.64 mL/min (ref >60.00)
Glucose, Bld: 66 mg/dL — ABNORMAL LOW (ref 70–99)
Total Bilirubin: 0.5 mg/dL (ref 0.3–1.2)

## 2012-01-12 LAB — CBC WITH DIFFERENTIAL/PLATELET
Eosinophils Relative: 1.3 % (ref 0.0–5.0)
HCT: 33.5 % — ABNORMAL LOW (ref 36.0–46.0)
Hemoglobin: 11 g/dL — ABNORMAL LOW (ref 12.0–15.0)
Lymphocytes Relative: 36.7 % (ref 12.0–46.0)
Lymphs Abs: 1.9 10*3/uL (ref 0.7–4.0)
Monocytes Relative: 11.3 % (ref 3.0–12.0)
Neutro Abs: 2.6 10*3/uL (ref 1.4–7.7)
Platelets: 267 10*3/uL (ref 150.0–400.0)
WBC: 5.2 10*3/uL (ref 4.5–10.5)

## 2012-01-12 LAB — FERRITIN: Ferritin: 13.8 ng/mL (ref 10.0–291.0)

## 2012-01-14 LAB — HM MAMMOGRAPHY: HM Mammogram: NORMAL

## 2012-01-29 ENCOUNTER — Ambulatory Visit: Payer: Self-pay | Admitting: Ophthalmology

## 2012-02-05 ENCOUNTER — Ambulatory Visit: Payer: Self-pay | Admitting: Ophthalmology

## 2012-02-06 ENCOUNTER — Other Ambulatory Visit: Payer: BC Managed Care – HMO

## 2012-02-23 ENCOUNTER — Telehealth: Payer: Self-pay | Admitting: Internal Medicine

## 2012-02-23 NOTE — Telephone Encounter (Signed)
No appts available, Urgent Care?

## 2012-02-23 NOTE — Telephone Encounter (Signed)
Patient advised via telephone, patient scheduled to see Dr. Dan Humphreys on Monday.

## 2012-02-23 NOTE — Telephone Encounter (Signed)
Caller: Carry/Patient; Patient Name: Barbara Mclaughlin; PCP: Ronna Polio (Adults only); Best Callback Phone Number: [phone]. Onset 02/22/12 Patient states she removed the estrostring last evening 02/22/12 and noted a foul odor.  All emergent symtoms ruled out per Vaginal Discharge or Irritation protocol with exception "First time occurrence of foul-smelling or unusual vaginal discharge."  Home care advice given.  PER DISPOSITION SEE PROVIDER WITHIN 24 HOURS, NO APPT. AVAILABLE VIA EPIC. PLEASE F/U WITH PATIENT WITH APPT. NEEDS.  THANK YOU.

## 2012-02-23 NOTE — Telephone Encounter (Signed)
We can either use urgent care or see her Monday

## 2012-02-26 ENCOUNTER — Ambulatory Visit: Payer: BC Managed Care – HMO | Admitting: Internal Medicine

## 2012-02-27 ENCOUNTER — Other Ambulatory Visit: Payer: Self-pay | Admitting: Internal Medicine

## 2012-02-28 NOTE — Telephone Encounter (Signed)
Rx called to [facility] pharmacy

## 2012-02-28 NOTE — Telephone Encounter (Signed)
Rx for called

## 2012-04-22 ENCOUNTER — Other Ambulatory Visit: Payer: Self-pay | Admitting: Internal Medicine

## 2012-04-22 NOTE — Telephone Encounter (Signed)
Hydrocodone refill request. Last filled on 10/22. Pt last seen on 01/11/12. Ok to refill?

## 2012-05-30 ENCOUNTER — Other Ambulatory Visit: Payer: Self-pay | Admitting: Internal Medicine

## 2012-05-30 NOTE — Telephone Encounter (Signed)
Electronic refill request.  Please advise. 

## 2012-05-30 NOTE — Telephone Encounter (Signed)
Rx called to pharmacy

## 2012-06-02 ENCOUNTER — Other Ambulatory Visit: Payer: Self-pay | Admitting: Internal Medicine

## 2012-06-17 ENCOUNTER — Other Ambulatory Visit: Payer: Self-pay | Admitting: Internal Medicine

## 2012-06-19 ENCOUNTER — Telehealth: Payer: Self-pay

## 2012-06-19 NOTE — Telephone Encounter (Signed)
We can refill x 1 month only.

## 2012-06-19 NOTE — Telephone Encounter (Signed)
Pt is calling and wanting to know if her Hydrocodone could be called in she called yesterday adn called the pharmacy and they have not received it yet.

## 2012-06-19 NOTE — Telephone Encounter (Signed)
Is it ok to refill Hydrocodone?

## 2012-06-21 ENCOUNTER — Other Ambulatory Visit: Payer: Self-pay | Admitting: Internal Medicine

## 2012-06-24 NOTE — Telephone Encounter (Signed)
Rx has been called to pharmacy on file

## 2012-06-28 ENCOUNTER — Other Ambulatory Visit: Payer: Self-pay | Admitting: *Deleted

## 2012-06-28 DIAGNOSIS — G8929 Other chronic pain: Secondary | ICD-10-CM

## 2012-06-28 DIAGNOSIS — I10 Essential (primary) hypertension: Secondary | ICD-10-CM

## 2012-06-28 DIAGNOSIS — K219 Gastro-esophageal reflux disease without esophagitis: Secondary | ICD-10-CM

## 2012-06-28 DIAGNOSIS — F329 Major depressive disorder, single episode, unspecified: Secondary | ICD-10-CM

## 2012-06-28 MED ORDER — FLUOXETINE HCL 20 MG PO CAPS: 20.0000 mg | ORAL_CAPSULE | Freq: Two times a day (BID) | ORAL | Status: DC

## 2012-06-28 MED ORDER — ESOMEPRAZOLE MAGNESIUM 40 MG PO CPDR: 40.0000 mg | DELAYED_RELEASE_CAPSULE | Freq: Every day | ORAL | Status: DC

## 2012-06-28 MED ORDER — LOSARTAN POTASSIUM 50 MG PO TABS: 50.0000 mg | ORAL_TABLET | Freq: Every day | ORAL | Status: DC

## 2012-06-28 MED ORDER — CELECOXIB 100 MG PO CAPS: 200.0000 mg | ORAL_CAPSULE | Freq: Every day | ORAL | Status: DC

## 2012-06-28 NOTE — Telephone Encounter (Signed)
Patient left voicemail requesting refill, stated the pharmacy had faxed this request a couple days ago and was awaiting a response. Scripts has been sent to the pharmacy on file per patient request.

## 2012-07-08 ENCOUNTER — Other Ambulatory Visit: Payer: Self-pay | Admitting: Internal Medicine

## 2012-07-11 ENCOUNTER — Ambulatory Visit (INDEPENDENT_AMBULATORY_CARE_PROVIDER_SITE_OTHER): Payer: BC Managed Care – PPO | Admitting: Internal Medicine

## 2012-07-11 ENCOUNTER — Encounter: Payer: Self-pay | Admitting: Internal Medicine

## 2012-07-11 VITALS — BP 124/90 | HR 69 | Temp 98.1°F | Wt 181.0 lb

## 2012-07-11 DIAGNOSIS — F329 Major depressive disorder, single episode, unspecified: Secondary | ICD-10-CM

## 2012-07-11 DIAGNOSIS — G8929 Other chronic pain: Secondary | ICD-10-CM

## 2012-07-11 DIAGNOSIS — E663 Overweight: Secondary | ICD-10-CM

## 2012-07-11 DIAGNOSIS — D649 Anemia, unspecified: Secondary | ICD-10-CM

## 2012-07-11 DIAGNOSIS — I10 Essential (primary) hypertension: Secondary | ICD-10-CM

## 2012-07-11 DIAGNOSIS — K219 Gastro-esophageal reflux disease without esophagitis: Secondary | ICD-10-CM

## 2012-07-11 LAB — CBC WITH DIFFERENTIAL/PLATELET
Basophils Relative: 2.1 % (ref 0.0–3.0)
Eosinophils Absolute: 0 10*3/uL (ref 0.0–0.7)
Lymphs Abs: 1.2 10*3/uL (ref 0.7–4.0)
MCHC: 33.5 g/dL (ref 30.0–36.0)
MCV: 88.1 fl (ref 78.0–100.0)
Monocytes Absolute: 0.6 10*3/uL (ref 0.1–1.0)
Neutrophils Relative %: 57.3 % (ref 43.0–77.0)
Platelets: 316 10*3/uL (ref 150.0–400.0)

## 2012-07-11 LAB — COMPREHENSIVE METABOLIC PANEL
Alkaline Phosphatase: 61 U/L (ref 39–117)
Creatinine, Ser: 0.6 mg/dL (ref 0.4–1.2)
Glucose, Bld: 90 mg/dL (ref 70–99)
Sodium: 135 mEq/L (ref 135–145)
Total Bilirubin: 0.5 mg/dL (ref 0.3–1.2)
Total Protein: 6.9 g/dL (ref 6.0–8.3)

## 2012-07-11 MED ORDER — HYDROCODONE-ACETAMINOPHEN 5-325 MG PO TABS: 1.0000 | ORAL_TABLET | Freq: Three times a day (TID) | ORAL | Status: DC | PRN

## 2012-07-11 MED ORDER — BUPROPION HCL ER (XL) 150 MG PO TB24: 150.0000 mg | ORAL_TABLET | Freq: Every day | ORAL | Status: DC

## 2012-07-11 MED ORDER — LOSARTAN POTASSIUM 50 MG PO TABS: 50.0000 mg | ORAL_TABLET | Freq: Every day | ORAL | Status: DC

## 2012-07-11 MED ORDER — ESOMEPRAZOLE MAGNESIUM 40 MG PO CPDR: 40.0000 mg | DELAYED_RELEASE_CAPSULE | Freq: Every day | ORAL | Status: DC

## 2012-07-11 MED ORDER — CELECOXIB 100 MG PO CAPS: 100.0000 mg | ORAL_CAPSULE | Freq: Two times a day (BID) | ORAL | Status: DC

## 2012-07-11 MED ORDER — FLUOXETINE HCL 20 MG PO CAPS: 20.0000 mg | ORAL_CAPSULE | Freq: Two times a day (BID) | ORAL | Status: DC

## 2012-07-11 NOTE — Assessment & Plan Note (Signed)
Symptoms well controlled with Fluoxetine. Will continue. 

## 2012-07-11 NOTE — Assessment & Plan Note (Signed)
Persistent mild anemia and low ferritin consistent with iron deficiency. Repeat stool testing negative for blood. Will set up hematology evaluation. Question if she may need IV iron infusion to replenish iron stores. Follow after heme eval.

## 2012-07-11 NOTE — Progress Notes (Signed)
Subjective:    Patient ID: Barbara Mclaughlin, female    DOB: Aug 29, 1950, 62 y.o.   MRN: [id number]  HPI 62YO female with h/o OA, chronic back and shoulder pain, hypertension presents for follow up.  OA - Recently seen by chiropractor and rheumatologist. Taking Celebrex with some improvement in back and shoulder aching pain. Pain does not radiate. It is exacerbated by physical activity.Occasionally uses Hydrocodone with improvement.  Hypertension - BP well controlled. Compliant with meds. No recent palpitations, headache, chest pain.  Weight - Pt struggling to lose weight. Trying to follow healthy diet and increased physical activity, with no improvement.  Outpatient Encounter Prescriptions as of 07/11/2012  Medication Sig Dispense Refill  . bisacodyl (BISACODYL) 5 MG EC tablet Take 5 mg by mouth daily as needed.       . celecoxib (CELEBREX) 100 MG capsule Take 1 capsule (100 mg total) by mouth 2 (two) times daily.  60 capsule  6  . esomeprazole (NEXIUM) 40 MG capsule Take 1 capsule (40 mg total) by mouth daily before breakfast.  30 capsule  11  . FLUoxetine (PROZAC) 20 MG capsule Take 1 capsule (20 mg total) by mouth 2 (two) times daily.  60 capsule  6  . losartan (COZAAR) 50 MG tablet Take 1 tablet (50 mg total) by mouth daily.  30 tablet  11  . buPROPion (WELLBUTRIN XL) 150 MG 24 hr tablet Take 1 tablet (150 mg total) by mouth daily.  30 tablet  3  . estradiol (ESTRING) 2 MG vaginal ring Place 2 mg vaginally every 3 (three) months. follow package directions  3 each  3  . HYDROcodone-acetaminophen (NORCO/VICODIN) 5-325 MG per tablet Take 1 tablet by mouth every 8 (eight) hours as needed for pain.  90 tablet  3   No facility-administered encounter medications on file as of 07/11/2012.   BP 124/90  Pulse 69  Temp(Src) 98.1 F (36.7 C) (Oral)  Wt 181 lb (82.101 kg)  BMI 27.53 kg/m2  SpO2 96%  Review of Systems  Constitutional: Negative for fever, chills, appetite change, fatigue and  unexpected weight change.  HENT: Negative for ear pain, congestion, sore throat, trouble swallowing, neck pain, voice change and sinus pressure.   Eyes: Negative for visual disturbance.  Respiratory: Negative for cough, shortness of breath, wheezing and stridor.   Cardiovascular: Negative for chest pain, palpitations and leg swelling.  Gastrointestinal: Negative for nausea, vomiting, abdominal pain, diarrhea, constipation, blood in stool, abdominal distention and anal bleeding.  Genitourinary: Negative for dysuria and flank pain.  Musculoskeletal: Positive for myalgias, back pain and arthralgias. Negative for gait problem.  Skin: Negative for color change and rash.  Neurological: Negative for dizziness and headaches.  Hematological: Negative for adenopathy. Does not bruise/bleed easily.  Psychiatric/Behavioral: Negative for suicidal ideas, sleep disturbance and dysphoric mood. The patient is not nervous/anxious.        Objective:   Physical Exam  Constitutional: She is oriented to person, place, and time. She appears well-developed and well-nourished. No distress.  HENT:  Head: Normocephalic and atraumatic.  Right Ear: External ear normal.  Left Ear: External ear normal.  Nose: Nose normal.  Mouth/Throat: Oropharynx is clear and moist. No oropharyngeal exudate.  Eyes: Conjunctivae are normal. Pupils are equal, round, and reactive to light. Right eye exhibits no discharge. Left eye exhibits no discharge. No scleral icterus.  Neck: Normal range of motion. Neck supple. No tracheal deviation present. No thyromegaly present.  Cardiovascular: Normal rate, regular rhythm, normal heart sounds  and intact distal pulses.  Exam reveals no gallop and no friction rub.   No murmur heard. Pulmonary/Chest: Effort normal and breath sounds normal. No respiratory distress. She has no wheezes. She has no rales. She exhibits no tenderness.  Musculoskeletal: Normal range of motion. She exhibits no edema and no  tenderness.       Right shoulder: She exhibits pain. She exhibits normal range of motion and no tenderness.       Cervical back: She exhibits pain.       Thoracic back: She exhibits tenderness and pain.  Lymphadenopathy:    She has no cervical adenopathy.  Neurological: She is alert and oriented to person, place, and time. No cranial nerve deficit. She exhibits normal muscle tone. Coordination normal.  Skin: Skin is warm and dry. No rash noted. She is not diaphoretic. No erythema. No pallor.  Psychiatric: She has a normal mood and affect. Her behavior is normal. Judgment and thought content normal.          Assessment & Plan:

## 2012-07-11 NOTE — Assessment & Plan Note (Signed)
Body mass index is 27.53 kg/(m^2).  Discussed goal of weight loss, with reduced caloric intake and increased physical activity. Will try adding wellbutrin to help with appetite suppression, as pt has not tolerated topamax and phentermine in the past.

## 2012-07-11 NOTE — Assessment & Plan Note (Signed)
BP Readings from Last 3 Encounters:  07/11/12 124/90  01/11/12 138/80  11/20/11 140/82   BP well controlled on current medications. Will continue. Follow up 6 months and prn.

## 2012-07-11 NOTE — Assessment & Plan Note (Signed)
Symptoms well controlled on Nexium. Will continue. 

## 2012-07-11 NOTE — Assessment & Plan Note (Signed)
Chronic pain from OA in back and right shoulder. On celebrex with some improvement and uses Hydrocodone prn severe pain. Will continue.

## 2012-07-19 ENCOUNTER — Telehealth: Payer: Self-pay | Admitting: *Deleted

## 2012-07-19 NOTE — Telephone Encounter (Signed)
Error

## 2012-07-24 ENCOUNTER — Telehealth: Payer: Self-pay | Admitting: Internal Medicine

## 2012-07-24 DIAGNOSIS — D649 Anemia, unspecified: Secondary | ICD-10-CM

## 2012-07-24 NOTE — Telephone Encounter (Signed)
Patient agreed to referral to hematology.

## 2012-07-24 NOTE — Telephone Encounter (Signed)
Patient returning your call about her blood work.

## 2012-07-25 ENCOUNTER — Telehealth: Payer: Self-pay | Admitting: Emergency Medicine

## 2012-07-25 NOTE — Telephone Encounter (Signed)
Patient had stated that she done some research and found that people who have gastric bypass tend to have low iron and she wanted to inform you that they recommend a prescribed iron supplement. Wondering if she still needed to go forward with see [facility].

## 2012-07-25 NOTE — Telephone Encounter (Signed)
Has she been taking this already? Often, after bypass, the iron is not absorbed well and needs to be given IV. I think it would be best for her to follow up with Dr. Orlie Dakin.

## 2012-07-26 NOTE — Telephone Encounter (Signed)
Patient informed and verbally agreed understanding. She has been taking something OTC but not prescription, but she will call Dr. Gerlene Burdock office

## 2012-08-01 ENCOUNTER — Ambulatory Visit: Payer: Self-pay | Admitting: Oncology

## 2012-08-01 LAB — CBC CANCER CENTER
Basophil #: 0.1 x10 3/mm (ref 0.0–0.1)
Basophil %: 3.1 %
Eosinophil #: 0.1 x10 3/mm (ref 0.0–0.7)
HCT: 36 % (ref 35.0–47.0)
Lymphocyte #: 1.5 x10 3/mm (ref 1.0–3.6)
Lymphocyte %: 33.4 %
MCH: 30.1 pg (ref 26.0–34.0)
MCV: 88 fL (ref 80–100)
Monocyte #: 0.6 x10 3/mm (ref 0.2–0.9)
Monocyte %: 14.5 %
Neutrophil #: 2.1 x10 3/mm (ref 1.4–6.5)
Neutrophil %: 47.7 %
Platelet: 271 x10 3/mm (ref 150–440)
RDW: 13.1 % (ref 11.5–14.5)
WBC: 4.4 x10 3/mm (ref 3.6–11.0)

## 2012-08-01 LAB — IRON AND TIBC
Iron Bind.Cap.(Total): 452 ug/dL — ABNORMAL HIGH (ref 250–450)
Iron Saturation: 31 %
Unbound Iron-Bind.Cap.: 312 ug/dL

## 2012-08-01 LAB — LACTATE DEHYDROGENASE: LDH: 223 U/L (ref 81–246)

## 2012-08-01 LAB — FERRITIN: Ferritin (ARMC): 18 ng/mL (ref 8–388)

## 2012-08-06 ENCOUNTER — Ambulatory Visit: Payer: Self-pay | Admitting: Oncology

## 2012-08-29 ENCOUNTER — Telehealth: Payer: Self-pay | Admitting: Internal Medicine

## 2012-08-29 NOTE — Telephone Encounter (Signed)
We can add her tomorrow in an acute slot.

## 2012-08-29 NOTE — Telephone Encounter (Signed)
Patient Information:  Caller Name: Jerlene  Phone: [phone]  Patient: Barbara, Mclaughlin  Gender: Female  DOB: Jan 04, 1951  Age: 62 Years  PCP: Ronna Polio (Adults only)  Office Follow Up:  Does the office need to follow up with this patient?: Yes  Instructions For The Office: F/U WITH PT WITH PROVIDER RECEOMMENDATION CONCERNING APPT. THANK YOU.  RN Note:  Pt states she is not available tomorrow for an appt.Marland Kitchen  PLEASE F/U WITH PT PER PROVIDER RECOMMENTATION, THANK YOU.  Symptoms  Reason For Call & Symptoms: Pt states she is having lright eg cramps nightly or every other night.  Pt reports cortisone injection in the right knee approx. 08/22/12  Reviewed Health History In EMR: Yes  Reviewed Medications In EMR: Yes  Reviewed Allergies In EMR: Yes  Reviewed Surgeries / Procedures: Yes  Date of Onset of Symptoms: 08/24/2012  Guideline(s) Used:  Leg Pain  Disposition Per Guideline:   See Today or Tomorrow in Office  Reason For Disposition Reached:   Patient wants to be seen  Advice Given:  Reassurance - Leg Pain  Usually leg pain is not serious. You have told me that there is no redness, numbness, or swelling.  Call Back If:  Signs of infection occur (e.g., spreading redness, warmth, fever)  You become worse.  Patient Will Follow Care Advice:  YES

## 2012-08-29 NOTE — Telephone Encounter (Signed)
Spoke with patient, offered her an appointment for tomorrow but she declined, stated she does not feel like that is it that emergent. She will make an appointment for next week, appointment scheduled for next week on Friday 09/06/2012.

## 2012-09-06 ENCOUNTER — Ambulatory Visit (INDEPENDENT_AMBULATORY_CARE_PROVIDER_SITE_OTHER): Payer: BC Managed Care – PPO | Admitting: Internal Medicine

## 2012-09-06 ENCOUNTER — Encounter: Payer: Self-pay | Admitting: Internal Medicine

## 2012-09-06 VITALS — BP 144/90 | HR 60 | Temp 98.2°F | Wt 182.0 lb

## 2012-09-06 DIAGNOSIS — E663 Overweight: Secondary | ICD-10-CM

## 2012-09-06 DIAGNOSIS — R252 Cramp and spasm: Secondary | ICD-10-CM

## 2012-09-06 DIAGNOSIS — K219 Gastro-esophageal reflux disease without esophagitis: Secondary | ICD-10-CM

## 2012-09-06 DIAGNOSIS — R682 Dry mouth, unspecified: Secondary | ICD-10-CM | POA: Insufficient documentation

## 2012-09-06 DIAGNOSIS — K117 Disturbances of salivary secretion: Secondary | ICD-10-CM

## 2012-09-06 LAB — CBC WITH DIFFERENTIAL/PLATELET
Basophils Absolute: 0.1 10*3/uL (ref 0.0–0.1)
Eosinophils Absolute: 0.1 10*3/uL (ref 0.0–0.7)
Lymphocytes Relative: 28 % (ref 12.0–46.0)
MCHC: 34 g/dL (ref 30.0–36.0)
MCV: 88.7 fl (ref 78.0–100.0)
Monocytes Absolute: 0.6 10*3/uL (ref 0.1–1.0)
Neutrophils Relative %: 57.8 % (ref 43.0–77.0)
Platelets: 293 10*3/uL (ref 150.0–400.0)
RDW: 14 % (ref 11.5–14.6)

## 2012-09-06 LAB — TSH: TSH: 0.4 u[IU]/mL (ref 0.35–5.50)

## 2012-09-06 MED ORDER — PHENTERMINE HCL 15 MG PO CAPS: 15.0000 mg | ORAL_CAPSULE | ORAL | Status: DC

## 2012-09-06 MED ORDER — PANTOPRAZOLE SODIUM 40 MG PO TBEC: 40.0000 mg | DELAYED_RELEASE_TABLET | Freq: Every day | ORAL | Status: DC

## 2012-09-06 MED ORDER — FLUOCINONIDE 0.05 % EX OINT: TOPICAL_OINTMENT | Freq: Two times a day (BID) | CUTANEOUS | Status: DC

## 2012-09-06 NOTE — Assessment & Plan Note (Signed)
Symptoms well controlled with nexium, however insurance will no longer cover. Will try Pantoprazole. Pt will email or call for follow up.

## 2012-09-06 NOTE — Progress Notes (Signed)
Subjective:    Patient ID: Barbara Mclaughlin, female    DOB: May 28, 1950, 62 y.o.   MRN: [id number]  HPI 62 year old female with history of depression, GERD, chronic pain presents for acute visit complaining of muscle cramping in her legs over the last few months. Cramping typically occurs at night. It is located generally in her calves. She denies any swelling or chronic pain in her calves. She denies use of any new medications or supplements. She has not taken anything for cramping.  She is also concerned about weight gain. She reports no improvement in appetite with use of Wellbutrin. She is trying to follow a healthy diet and get regular physical activity however weight has been stagnant. She would like to try phentermine again this has worked well for her in the past.  She also notes that her insurance company has stopped covering Nexium. She would like to try an alternative medication.  She also notes chronic h/o dry mouth. In the past, she had used Fluocinonide with improvement. She would like to try this again.  Outpatient Encounter Prescriptions as of 09/06/2012  Medication Sig Dispense Refill  . bisacodyl (BISACODYL) 5 MG EC tablet Take 5 mg by mouth daily as needed.       . celecoxib (CELEBREX) 100 MG capsule Take 1 capsule (100 mg total) by mouth 2 (two) times daily.  60 capsule  6  . estradiol (ESTRING) 2 MG vaginal ring Place 2 mg vaginally every 3 (three) months. follow package directions  3 each  3  . FLUoxetine (PROZAC) 20 MG capsule Take 1 capsule (20 mg total) by mouth 2 (two) times daily.  60 capsule  6  . HYDROcodone-acetaminophen (NORCO/VICODIN) 5-325 MG per tablet Take 1 tablet by mouth every 8 (eight) hours as needed for pain.  90 tablet  3  . losartan (COZAAR) 50 MG tablet Take 1 tablet (50 mg total) by mouth daily.  30 tablet  11  . [DISCONTINUED] buPROPion (WELLBUTRIN XL) 150 MG 24 hr tablet Take 1 tablet (150 mg total) by mouth daily.  30 tablet  3  . [DISCONTINUED]  esomeprazole (NEXIUM) 40 MG capsule Take 1 capsule (40 mg total) by mouth daily before breakfast.  30 capsule  11  . fluocinonide ointment (LIDEX) 0.05 % Apply topically 2 (two) times daily.  30 g  0  . pantoprazole (PROTONIX) 40 MG tablet Take 1 tablet (40 mg total) by mouth daily.  30 tablet  3  . phentermine 15 MG capsule Take 1 capsule (15 mg total) by mouth every morning.  30 capsule  0   No facility-administered encounter medications on file as of 09/06/2012.   BP 144/90  Pulse 60  Temp(Src) 98.2 F (36.8 C) (Oral)  Wt 182 lb (82.555 kg)  BMI 27.68 kg/m2  SpO2 96%  Review of Systems  Constitutional: Negative for fever, chills, appetite change, fatigue and unexpected weight change.  HENT: Negative for ear pain, congestion, sore throat, mouth sores, trouble swallowing, neck pain, voice change and sinus pressure.   Eyes: Negative for visual disturbance.  Respiratory: Negative for cough, shortness of breath, wheezing and stridor.   Cardiovascular: Negative for chest pain, palpitations and leg swelling.  Gastrointestinal: Negative for nausea, vomiting, abdominal pain, diarrhea, constipation, blood in stool, abdominal distention and anal bleeding.  Genitourinary: Negative for dysuria and flank pain.  Musculoskeletal: Positive for myalgias. Negative for arthralgias and gait problem.  Skin: Negative for color change and rash.  Neurological: Negative for dizziness and headaches.  Hematological: Negative for adenopathy. Does not bruise/bleed easily.  Psychiatric/Behavioral: Negative for suicidal ideas, sleep disturbance and dysphoric mood. The patient is not nervous/anxious.        Objective:   Physical Exam  Constitutional: She is oriented to person, place, and time. She appears well-developed and well-nourished. No distress.  HENT:  Head: Normocephalic and atraumatic.  Right Ear: External ear normal.  Left Ear: External ear normal.  Nose: Nose normal.  Mouth/Throat: Oropharynx is  clear and moist. Mucous membranes are dry. No oropharyngeal exudate.  Eyes: Conjunctivae are normal. Pupils are equal, round, and reactive to light. Right eye exhibits no discharge. Left eye exhibits no discharge. No scleral icterus.  Neck: Normal range of motion. Neck supple. No tracheal deviation present. No thyromegaly present.  Cardiovascular: Normal rate, regular rhythm, normal heart sounds and intact distal pulses.  Exam reveals no gallop and no friction rub.   No murmur heard. Pulmonary/Chest: Effort normal and breath sounds normal. No accessory muscle usage. Not tachypneic. No respiratory distress. She has no wheezes. She has no rales. She exhibits no tenderness.  Musculoskeletal: Normal range of motion. She exhibits no edema and no tenderness.  Lymphadenopathy:    She has no cervical adenopathy.  Neurological: She is alert and oriented to person, place, and time. No cranial nerve deficit. She exhibits normal muscle tone. Coordination normal.  Skin: Skin is warm and dry. No rash noted. She is not diaphoretic. No erythema. No pallor.  Psychiatric: She has a normal mood and affect. Her behavior is normal. Judgment and thought content normal.          Assessment & Plan:

## 2012-09-06 NOTE — Assessment & Plan Note (Signed)
Pt with chronic dry mouth. Prescribed Fluocinonide in the past with improvement in symptoms. Question if she may have Sjorgren's syndrome. Will check ANA, Sjogren's antibodies with labs today.

## 2012-09-06 NOTE — Assessment & Plan Note (Signed)
Body mass index is 27.68 kg/(m^2).  BMI 27. No improvement in appetite with use of Wellbutrin. Encouraged keeping food diary and getting regular physical activity. Will try low dose of phentermine to help with appetite suppression. Follow up 1 month.

## 2012-09-06 NOTE — Assessment & Plan Note (Signed)
Persistent nocturnal leg cramps. Exam is normal. Will check electrolytes today including magnesium. We'll also check thyroid function, CBC. Encouraged increased fluid hydration. If labs are normal, consider vascular evaluation for PAD.

## 2012-09-09 ENCOUNTER — Encounter: Payer: Self-pay | Admitting: *Deleted

## 2012-09-09 LAB — COMPREHENSIVE METABOLIC PANEL
ALT: 20 U/L (ref 0–35)
Albumin: 4.3 g/dL (ref 3.5–5.2)
CO2: 30 mEq/L (ref 19–32)
Calcium: 9.1 mg/dL (ref 8.4–10.5)
Chloride: 99 mEq/L (ref 96–112)
GFR: 91.41 mL/min (ref >60.00)
Glucose, Bld: 91 mg/dL (ref 70–99)
Potassium: 4.6 mEq/L (ref 3.5–5.1)
Sodium: 134 mEq/L — ABNORMAL LOW (ref 135–145)
Total Bilirubin: 0.6 mg/dL (ref 0.3–1.2)
Total Protein: 6.8 g/dL (ref 6.0–8.3)

## 2012-10-08 ENCOUNTER — Ambulatory Visit: Payer: BC Managed Care – PPO | Admitting: Internal Medicine

## 2012-10-31 ENCOUNTER — Ambulatory Visit: Payer: Self-pay | Admitting: Oncology

## 2012-10-31 LAB — CBC CANCER CENTER
Basophil %: 2.3 %
HGB: 12.4 g/dL (ref 12.0–16.0)
Lymphocyte #: 1.7 x10 3/mm (ref 1.0–3.6)
Lymphocyte %: 27.6 %
MCH: 30.8 pg (ref 26.0–34.0)
MCHC: 34.3 g/dL (ref 32.0–36.0)
MCV: 90 fL (ref 80–100)
Monocyte #: 0.8 x10 3/mm (ref 0.2–0.9)
Neutrophil #: 3.4 x10 3/mm (ref 1.4–6.5)
Platelet: 321 x10 3/mm (ref 150–440)
RBC: 4.04 10*6/uL (ref 3.80–5.20)
RDW: 13.5 % (ref 11.5–14.5)
WBC: 6.2 x10 3/mm (ref 3.6–11.0)

## 2012-10-31 LAB — IRON AND TIBC
Iron Bind.Cap.(Total): 413 ug/dL (ref 250–450)
Iron Saturation: 23 %
Unbound Iron-Bind.Cap.: 319 ug/dL

## 2012-10-31 LAB — FERRITIN: Ferritin (ARMC): 17 ng/mL (ref 8–388)

## 2012-11-04 ENCOUNTER — Other Ambulatory Visit: Payer: Self-pay | Admitting: *Deleted

## 2012-11-05 ENCOUNTER — Ambulatory Visit: Payer: Self-pay | Admitting: Oncology

## 2012-11-14 ENCOUNTER — Other Ambulatory Visit: Payer: Self-pay | Admitting: *Deleted

## 2012-11-14 NOTE — Telephone Encounter (Signed)
Needs a follow up appointment.

## 2012-12-09 ENCOUNTER — Telehealth: Payer: Self-pay | Admitting: Internal Medicine

## 2012-12-09 DIAGNOSIS — G8929 Other chronic pain: Secondary | ICD-10-CM

## 2012-12-09 NOTE — Telephone Encounter (Signed)
Fwd. Dr. Dan Humphreys

## 2012-12-09 NOTE — Telephone Encounter (Signed)
Fine to refill. Will need to come in and do UDS and sign contract

## 2012-12-09 NOTE — Telephone Encounter (Signed)
HYDROcodone-acetaminophen (NORCO/VICODIN) 5-325 MG per tablet ° °

## 2012-12-10 MED ORDER — HYDROCODONE-ACETAMINOPHEN 5-325 MG PO TABS: 1.0000 | ORAL_TABLET | Freq: Three times a day (TID) | ORAL | Status: DC | PRN

## 2012-12-10 NOTE — Telephone Encounter (Signed)
Rx printed and patient aware she has to come pick up prescription and bring ID.

## 2013-01-07 ENCOUNTER — Other Ambulatory Visit: Payer: Self-pay | Admitting: *Deleted

## 2013-01-07 DIAGNOSIS — G8929 Other chronic pain: Secondary | ICD-10-CM

## 2013-01-08 MED ORDER — HYDROCODONE-ACETAMINOPHEN 5-325 MG PO TABS: 1.0000 | ORAL_TABLET | Freq: Three times a day (TID) | ORAL | Status: DC | PRN

## 2013-01-13 ENCOUNTER — Encounter: Payer: Self-pay | Admitting: Internal Medicine

## 2013-01-13 ENCOUNTER — Encounter: Payer: Self-pay | Admitting: *Deleted

## 2013-01-13 ENCOUNTER — Ambulatory Visit (INDEPENDENT_AMBULATORY_CARE_PROVIDER_SITE_OTHER): Payer: BC Managed Care – PPO | Admitting: Internal Medicine

## 2013-01-13 VITALS — BP 132/90 | HR 71 | Temp 98.2°F | Ht 66.5 in | Wt 181.0 lb

## 2013-01-13 DIAGNOSIS — E663 Overweight: Secondary | ICD-10-CM

## 2013-01-13 DIAGNOSIS — G8929 Other chronic pain: Secondary | ICD-10-CM

## 2013-01-13 DIAGNOSIS — F329 Major depressive disorder, single episode, unspecified: Secondary | ICD-10-CM

## 2013-01-13 DIAGNOSIS — Z23 Encounter for immunization: Secondary | ICD-10-CM

## 2013-01-13 DIAGNOSIS — I10 Essential (primary) hypertension: Secondary | ICD-10-CM

## 2013-01-13 DIAGNOSIS — Z0000 Encounter for general adult medical examination without abnormal findings: Secondary | ICD-10-CM

## 2013-01-13 DIAGNOSIS — F3289 Other specified depressive episodes: Secondary | ICD-10-CM

## 2013-01-13 LAB — CBC WITH DIFFERENTIAL/PLATELET
Basophils Absolute: 0.1 10*3/uL (ref 0.0–0.1)
Basophils Relative: 1.4 % (ref 0.0–3.0)
Hemoglobin: 12.2 g/dL (ref 12.0–15.0)
Lymphocytes Relative: 26.1 % (ref 12.0–46.0)
Monocytes Relative: 11.3 % (ref 3.0–12.0)
Neutro Abs: 3 10*3/uL (ref 1.4–7.7)
Neutrophils Relative %: 60.8 % (ref 43.0–77.0)
RBC: 4.02 Mil/uL (ref 3.87–5.11)

## 2013-01-13 LAB — COMPREHENSIVE METABOLIC PANEL WITH GFR
ALT: 18 U/L (ref 0–35)
AST: 24 U/L (ref 0–37)
Albumin: 4.1 g/dL (ref 3.5–5.2)
Alkaline Phosphatase: 59 U/L (ref 39–117)
BUN: 14 mg/dL (ref 6–23)
CO2: 29 meq/L (ref 19–32)
Calcium: 9.5 mg/dL (ref 8.4–10.5)
Chloride: 103 meq/L (ref 96–112)
Creatinine, Ser: 0.6 mg/dL (ref 0.4–1.2)
GFR: 113.83 mL/min
Glucose, Bld: 97 mg/dL (ref 70–99)
Potassium: 4.4 meq/L (ref 3.5–5.1)
Sodium: 137 meq/L (ref 135–145)
Total Bilirubin: 0.5 mg/dL (ref 0.3–1.2)
Total Protein: 7 g/dL (ref 6.0–8.3)

## 2013-01-13 LAB — TSH: TSH: 0.32 u[IU]/mL — ABNORMAL LOW (ref 0.35–5.50)

## 2013-01-13 LAB — LIPID PANEL
Cholesterol: 167 mg/dL (ref 0–200)
HDL: 52.9 mg/dL
LDL Cholesterol: 98 mg/dL (ref 0–99)
Total CHOL/HDL Ratio: 3
Triglycerides: 79 mg/dL (ref 0.0–149.0)
VLDL: 15.8 mg/dL (ref 0.0–40.0)

## 2013-01-13 LAB — MICROALBUMIN / CREATININE URINE RATIO
Creatinine,U: 36.4 mg/dL
Microalb Creat Ratio: 1.4 mg/g (ref 0.0–30.0)
Microalb, Ur: 0.5 mg/dL (ref 0.0–1.9)

## 2013-01-13 MED ORDER — LOSARTAN POTASSIUM 50 MG PO TABS: 50.0000 mg | ORAL_TABLET | Freq: Every day | ORAL | Status: DC

## 2013-01-13 MED ORDER — CELECOXIB 100 MG PO CAPS: 100.0000 mg | ORAL_CAPSULE | Freq: Two times a day (BID) | ORAL | Status: DC

## 2013-01-13 MED ORDER — PHENTERMINE HCL 15 MG PO CAPS: 15.0000 mg | ORAL_CAPSULE | ORAL | Status: DC

## 2013-01-13 MED ORDER — FLUOXETINE HCL 20 MG PO CAPS: ORAL_CAPSULE | ORAL | Status: DC

## 2013-01-13 NOTE — Assessment & Plan Note (Signed)
Recent worsening of symptoms of depressed mood with death of her brother and husband's illness. Will try increasing Fluoxetine to 40mg  po qam and 20mg  po qpm. Pt will call or email if symptoms not improving. Also discussed counseling through [facility]. Follow up in 1 month and prn.

## 2013-01-13 NOTE — Progress Notes (Signed)
Subjective:    Patient ID: Barbara Mclaughlin, female    DOB: [birth year], 62 y.o.   MRN: [id number]  HPI 62YO female with h/o chronic pain from osteoarthritis, depression, hypertension presents for annual exam. She notes it has been a difficult time for her. She recently lost her brother to lung cancer, and she has been struggling to care for her husband who has a chronic pain issue after carpal tunnel surgery. She notes that she has not slept a full night for several weeks. Symptoms of depressed mood have increased. Aside from this she is doing well. She continues to try to follow a healthy diet. She is not following any particular exercise program. Exercise is limited by chronic back pain, which is made worse with activity. She is unsure of date of last colonoscopy and mammogram. Last PAP was 2013.  Outpatient Encounter Prescriptions as of 01/13/2013  Medication Sig Dispense Refill  . bisacodyl (BISACODYL) 5 MG EC tablet Take 5 mg by mouth daily as needed.       . celecoxib (CELEBREX) 100 MG capsule Take 1 capsule (100 mg total) by mouth 2 (two) times daily.  60 capsule  6  . FLUoxetine (PROZAC) 20 MG capsule 40mg  po qam and 20mg  po qpm  90 capsule  6  . HYDROcodone-acetaminophen (NORCO/VICODIN) 5-325 MG per tablet Take 1 tablet by mouth every 8 (eight) hours as needed for pain.  90 tablet  0  . losartan (COZAAR) 50 MG tablet Take 1 tablet (50 mg total) by mouth daily.  30 tablet  11  . phentermine 15 MG capsule Take 1 capsule (15 mg total) by mouth every morning.  30 capsule  0   No facility-administered encounter medications on file as of 01/13/2013.   BP 132/90  Pulse 71  Temp(Src) 98.2 F (36.8 C) (Oral)  Ht 5' 6.5" (1.689 m)  Wt 181 lb (82.101 kg)  BMI 28.78 kg/m2  SpO2 96%  Review of Systems  Constitutional: Negative for fever, chills, appetite change, fatigue and unexpected weight change.  HENT: Negative for ear pain, congestion, sore throat, trouble swallowing, neck pain, voice  change and sinus pressure.   Eyes: Negative for visual disturbance.  Respiratory: Negative for cough, shortness of breath, wheezing and stridor.   Cardiovascular: Negative for chest pain, palpitations and leg swelling.  Gastrointestinal: Negative for nausea, vomiting, abdominal pain, diarrhea, constipation, blood in stool, abdominal distention and anal bleeding.  Genitourinary: Negative for dysuria and flank pain.  Musculoskeletal: Positive for back pain and arthralgias. Negative for myalgias and gait problem.  Skin: Negative for color change and rash.  Neurological: Negative for dizziness and headaches.  Hematological: Negative for adenopathy. Does not bruise/bleed easily.  Psychiatric/Behavioral: Positive for dysphoric mood. Negative for suicidal ideas and sleep disturbance. The patient is not nervous/anxious.        Objective:   Physical Exam  Constitutional: She is oriented to person, place, and time. She appears well-developed and well-nourished. No distress.  HENT:  Head: Normocephalic and atraumatic.  Right Ear: External ear normal.  Left Ear: External ear normal.  Nose: Nose normal.  Mouth/Throat: Oropharynx is clear and moist. No oropharyngeal exudate.  Eyes: Conjunctivae are normal. Pupils are equal, round, and reactive to light. Right eye exhibits no discharge. Left eye exhibits no discharge. No scleral icterus.  Neck: Normal range of motion. Neck supple. No tracheal deviation present. No thyromegaly present.  Cardiovascular: Normal rate, regular rhythm, normal heart sounds and intact distal pulses.  Exam reveals no  gallop and no friction rub.   No murmur heard. Pulmonary/Chest: Effort normal and breath sounds normal. No accessory muscle usage. Not tachypneic. No respiratory distress. She has no decreased breath sounds. She has no wheezes. She has no rales. She exhibits no tenderness. Right breast exhibits no inverted nipple, no mass, no nipple discharge, no skin change and no  tenderness. Left breast exhibits no inverted nipple, no mass, no nipple discharge, no skin change and no tenderness. Breasts are symmetrical.  Abdominal: Soft. Bowel sounds are normal. She exhibits no distension and no mass. There is no tenderness. There is no rebound and no guarding.  Musculoskeletal: Normal range of motion. She exhibits no edema and no tenderness.  Lymphadenopathy:    She has no cervical adenopathy.  Neurological: She is alert and oriented to person, place, and time. No cranial nerve deficit. She exhibits normal muscle tone. Coordination normal.  Skin: Skin is warm and dry. No rash noted. She is not diaphoretic. No erythema. No pallor.  Psychiatric: Her behavior is normal. Judgment and thought content normal. She exhibits a depressed mood. She expresses no suicidal ideation.          Assessment & Plan:

## 2013-01-13 NOTE — Assessment & Plan Note (Signed)
Weight has been relatively stable over the last year.  Pt has had some success in the past using phentermine to help with appetite suppression. Will restart this medication, however discussed need to monitor BP carefully. Follow up 1 month.

## 2013-01-13 NOTE — Assessment & Plan Note (Signed)
General medical exam normal today including breast exam. PAP and pelvic deferred as normal 11/2011. Plan repeat PAP in 2018. Mammogram ordered. Will request results on last colonoscopy. Influenza vaccine given today. Will check labs including CBC, CMP, lipids, TSH, Vit D. Encouraged healthy diet and regular physical activity, such as water activity given h/o OA.

## 2013-01-13 NOTE — Assessment & Plan Note (Signed)
BP Readings from Last 3 Encounters:  01/13/13 132/90  09/06/12 144/90  07/11/12 124/90   BP generally well controlled at home, slightly elevated today. Discussed monitoring at home. Pt will call if consistently >140/90. Will check renal function with labs today. Follow up 1 month.

## 2013-01-13 NOTE — Assessment & Plan Note (Signed)
Chronic pain from OA in back and shoulder. Symptoms well controlled with Celebrex and prn Hydrocodone. Will continue.

## 2013-01-14 LAB — VITAMIN D 25 HYDROXY (VIT D DEFICIENCY, FRACTURES): Vit D, 25-Hydroxy: 38 ng/mL (ref 30–89)

## 2013-01-16 ENCOUNTER — Encounter: Payer: Self-pay | Admitting: *Deleted

## 2013-02-06 ENCOUNTER — Other Ambulatory Visit: Payer: Self-pay | Admitting: *Deleted

## 2013-02-06 DIAGNOSIS — G8929 Other chronic pain: Secondary | ICD-10-CM

## 2013-02-06 MED ORDER — HYDROCODONE-ACETAMINOPHEN 5-325 MG PO TABS: 1.0000 | ORAL_TABLET | Freq: Three times a day (TID) | ORAL | Status: DC | PRN

## 2013-02-07 ENCOUNTER — Other Ambulatory Visit: Payer: Self-pay | Admitting: *Deleted

## 2013-02-07 DIAGNOSIS — G8929 Other chronic pain: Secondary | ICD-10-CM

## 2013-02-07 MED ORDER — HYDROCODONE-ACETAMINOPHEN 5-325 MG PO TABS: 1.0000 | ORAL_TABLET | Freq: Three times a day (TID) | ORAL | Status: DC | PRN

## 2013-02-07 NOTE — Telephone Encounter (Signed)
Would like a refill on pain medication, ok to refill?

## 2013-02-07 NOTE — Telephone Encounter (Signed)
Fine to refill 

## 2013-02-18 ENCOUNTER — Ambulatory Visit: Payer: Self-pay | Admitting: Internal Medicine

## 2013-03-03 ENCOUNTER — Encounter: Payer: Self-pay | Admitting: Internal Medicine

## 2013-03-10 ENCOUNTER — Telehealth: Payer: Self-pay | Admitting: Emergency Medicine

## 2013-03-10 DIAGNOSIS — G8929 Other chronic pain: Secondary | ICD-10-CM

## 2013-03-10 NOTE — Telephone Encounter (Signed)
We can give her a refill. She will need to do UDS and sign contract if she has not.

## 2013-03-10 NOTE — Telephone Encounter (Signed)
Left message to call back  

## 2013-03-10 NOTE — Telephone Encounter (Signed)
Fwd to Dr. Walker 

## 2013-03-10 NOTE — Telephone Encounter (Signed)
Patient calling requesting the refill on HYDROcodone-acetaminophen (NORCO/VICODIN) 5-325 MG per tablet. Patient has 4 pills left. Please advise.

## 2013-03-11 ENCOUNTER — Encounter: Payer: Self-pay | Admitting: *Deleted

## 2013-03-11 MED ORDER — HYDROCODONE-ACETAMINOPHEN 5-325 MG PO TABS: 1.0000 | ORAL_TABLET | Freq: Three times a day (TID) | ORAL | Status: DC | PRN

## 2013-03-11 NOTE — Telephone Encounter (Signed)
Patient walked in, prescription was printed and signed. Picked up prescription and signed UDS contract.

## 2013-03-28 ENCOUNTER — Encounter: Payer: Self-pay | Admitting: Internal Medicine

## 2013-04-08 ENCOUNTER — Telehealth: Payer: Self-pay

## 2013-04-08 DIAGNOSIS — G8929 Other chronic pain: Secondary | ICD-10-CM

## 2013-04-08 NOTE — Telephone Encounter (Signed)
Fine to refill 

## 2013-04-08 NOTE — Telephone Encounter (Signed)
Pt is needing refill on Vicodin

## 2013-04-08 NOTE — Telephone Encounter (Signed)
Ok to refill, had CPE in September

## 2013-04-09 MED ORDER — HYDROCODONE-ACETAMINOPHEN 5-325 MG PO TABS: 1.0000 | ORAL_TABLET | Freq: Three times a day (TID) | ORAL | Status: DC | PRN

## 2013-04-09 NOTE — Telephone Encounter (Signed)
Rx printed for sig and patient aware to pick up tomorrow

## 2013-04-22 ENCOUNTER — Ambulatory Visit: Payer: Self-pay | Admitting: Ophthalmology

## 2013-04-22 LAB — HEMOGLOBIN: HGB: 12.2 g/dL (ref 12.0–16.0)

## 2013-04-28 ENCOUNTER — Telehealth: Payer: Self-pay | Admitting: Internal Medicine

## 2013-04-28 NOTE — Telephone Encounter (Signed)
Pt notified, appointment scheduled 

## 2013-04-28 NOTE — Telephone Encounter (Signed)
The patient is having problems with reflux. She is asking for something different to be called to the pharmacy.

## 2013-04-28 NOTE — Telephone Encounter (Signed)
Pt complaining of acid reflux in the morning and evening after taking medications. Has been using OTC Nexium 2 tabs daily without relief. Has never used a  Rx before. Requesting something to be sent to [facility] [address]

## 2013-04-28 NOTE — Telephone Encounter (Signed)
Needs to be seen. If nexium not working, needs to be seen.

## 2013-04-29 ENCOUNTER — Encounter (INDEPENDENT_AMBULATORY_CARE_PROVIDER_SITE_OTHER): Payer: Self-pay

## 2013-04-29 ENCOUNTER — Ambulatory Visit (INDEPENDENT_AMBULATORY_CARE_PROVIDER_SITE_OTHER): Payer: BC Managed Care – PPO | Admitting: Internal Medicine

## 2013-04-29 ENCOUNTER — Encounter: Payer: Self-pay | Admitting: Internal Medicine

## 2013-04-29 VITALS — BP 140/98 | HR 79 | Temp 98.1°F | Ht 66.5 in | Wt 182.0 lb

## 2013-04-29 DIAGNOSIS — R1314 Dysphagia, pharyngoesophageal phase: Secondary | ICD-10-CM

## 2013-04-29 DIAGNOSIS — K219 Gastro-esophageal reflux disease without esophagitis: Secondary | ICD-10-CM

## 2013-04-29 DIAGNOSIS — J0100 Acute maxillary sinusitis, unspecified: Secondary | ICD-10-CM | POA: Insufficient documentation

## 2013-04-29 MED ORDER — AMOXICILLIN-POT CLAVULANATE 875-125 MG PO TABS: 1.0000 | ORAL_TABLET | Freq: Two times a day (BID) | ORAL | Status: DC

## 2013-04-29 NOTE — Assessment & Plan Note (Signed)
Worsening symptoms acid reflux despite use of Nexium. Will check for H. Pylori. Add Zantac. Set up GI evaluation for upper endoscopy.

## 2013-04-29 NOTE — Progress Notes (Signed)
Pre-visit discussion using our clinic review tool. No additional management support is needed unless otherwise documented below in the visit note.  

## 2013-04-29 NOTE — Progress Notes (Signed)
Subjective:    Patient ID: Barbara Mclaughlin, female    DOB: 1951/01/26, 62 y.o.   MRN: [id number]  HPI 62YO female presents for acute visit. Over last 2 weeks, increased epigastric pain described as burning, belching. No nausea or vomiting. Taking Nexium daily with no improvement. Occasionally feels that food may become stuck. No h/o upper endoscopy. No change in appetite, or change in weight.  Also concerned about 1 week h/o nasal congestion, increased sinus pressure, purulent nasal drainage. No fever, chills. No dyspnea. Occasional cough which is generally non-productive. Using some OTC cold meds with minimal improvement.  Outpatient Encounter Prescriptions as of 04/29/2013  Medication Sig  . bisacodyl (BISACODYL) 5 MG EC tablet Take 5 mg by mouth daily as needed.   . celecoxib (CELEBREX) 100 MG capsule Take 1 capsule (100 mg total) by mouth 2 (two) times daily.  . fluocinonide ointment (LIDEX) 0.05 % Apply topically 2 (two) times daily.  Marland Kitchen FLUoxetine (PROZAC) 20 MG capsule 40mg  po qam and 20mg  po qpm  . HYDROcodone-acetaminophen (NORCO/VICODIN) 5-325 MG per tablet Take 1 tablet by mouth every 8 (eight) hours as needed.  Marland Kitchen losartan (COZAAR) 50 MG tablet Take 1 tablet (50 mg total) by mouth daily.  . phentermine 15 MG capsule Take 1 capsule (15 mg total) by mouth every morning.     Review of Systems  Constitutional: Positive for fatigue. Negative for fever, chills, appetite change and unexpected weight change.  HENT: Positive for congestion, sinus pressure and trouble swallowing. Negative for ear pain, postnasal drip, rhinorrhea, sore throat and voice change.   Eyes: Negative for visual disturbance.  Respiratory: Positive for cough. Negative for shortness of breath, wheezing and stridor.   Cardiovascular: Negative for chest pain, palpitations and leg swelling.  Gastrointestinal: Positive for abdominal pain. Negative for nausea, vomiting, diarrhea, constipation, blood in stool,  abdominal distention and anal bleeding.  Genitourinary: Negative for dysuria and flank pain.  Musculoskeletal: Negative for arthralgias, gait problem, myalgias and neck pain.  Skin: Negative for color change and rash.  Neurological: Negative for dizziness and headaches.  Hematological: Negative for adenopathy. Does not bruise/bleed easily.  Psychiatric/Behavioral: Negative for suicidal ideas, sleep disturbance and dysphoric mood. The patient is not nervous/anxious.        Objective:   Physical Exam  Constitutional: She is oriented to person, place, and time. She appears well-developed and well-nourished. No distress.  HENT:  Head: Normocephalic and atraumatic.  Right Ear: External ear normal. A middle ear effusion is present.  Left Ear: External ear normal. A middle ear effusion is present.  Nose: Mucosal edema present. Right sinus exhibits maxillary sinus tenderness. Left sinus exhibits maxillary sinus tenderness.  Mouth/Throat: Oropharynx is clear and moist. No oropharyngeal exudate.  Eyes: Conjunctivae are normal. Pupils are equal, round, and reactive to light. Right eye exhibits no discharge. Left eye exhibits no discharge. No scleral icterus.  Neck: Normal range of motion. Neck supple. No tracheal deviation present. No thyromegaly present.  Cardiovascular: Normal rate, regular rhythm, normal heart sounds and intact distal pulses.  Exam reveals no gallop and no friction rub.   No murmur heard. Pulmonary/Chest: Effort normal and breath sounds normal. No accessory muscle usage. Not tachypneic. No respiratory distress. She has no decreased breath sounds. She has no wheezes. She has no rhonchi. She has no rales. She exhibits no tenderness.  Musculoskeletal: Normal range of motion. She exhibits no edema and no tenderness.  Lymphadenopathy:    She has no cervical adenopathy.  Neurological: She is alert and oriented to person, place, and time. No cranial nerve deficit. She exhibits normal  muscle tone. Coordination normal.  Skin: Skin is warm and dry. No rash noted. She is not diaphoretic. No erythema. No pallor.  Psychiatric: She has a normal mood and affect. Her behavior is normal. Judgment and thought content normal.          Assessment & Plan:

## 2013-04-29 NOTE — Assessment & Plan Note (Signed)
Symptoms consistent with acute maxillary sinusitis. Will start Augmentin. Continue Tyelnol or Ibuprofen prn pain. Pt will call if symptoms are not improving over next few days.

## 2013-04-29 NOTE — Assessment & Plan Note (Signed)
Pt notes dysphagia with solid foods on occasion. Will set up GI evaluation for upper endoscopy.

## 2013-04-29 NOTE — Patient Instructions (Signed)
Continue Nexium. Add Zantac 150mg  daily at bedtime. We will set up GI evaluation.  We will send test for H. Pylori.  Start Augmentin for sinus infection.  Follow up 4 weeks.

## 2013-05-05 ENCOUNTER — Ambulatory Visit: Payer: Self-pay | Admitting: Ophthalmology

## 2013-05-13 ENCOUNTER — Telehealth: Payer: Self-pay | Admitting: Internal Medicine

## 2013-05-13 DIAGNOSIS — G8929 Other chronic pain: Secondary | ICD-10-CM

## 2013-05-13 NOTE — Telephone Encounter (Signed)
HYDROcodone-acetaminophen (NORCO/VICODIN) 5-325 MG per tablet

## 2013-05-13 NOTE — Telephone Encounter (Signed)
Please talk with pt. If she is taking 3 Hydrocodone per day for pain, then we need to set up referral to pain management.

## 2013-05-13 NOTE — Telephone Encounter (Signed)
Spoke with patient she state she is taking 2 -3 tablets a day. When informed her she will need to be referred to pain management, her response was she will try to take only 2 a day to see how that works before being referred to pain management.

## 2013-05-13 NOTE — Telephone Encounter (Signed)
Last refill was on 04/09/13 # 90. Take 1 tablet by mouth every 8 hours. Ok to refill Hydrocodone?

## 2013-05-15 MED ORDER — HYDROCODONE-ACETAMINOPHEN 5-325 MG PO TABS: 1.0000 | ORAL_TABLET | Freq: Three times a day (TID) | ORAL | Status: DC | PRN

## 2013-05-15 NOTE — Telephone Encounter (Signed)
We can refill disp #60

## 2013-05-15 NOTE — Telephone Encounter (Signed)
Rx printed and signed, informed patient she could come pick it up tomorrow.

## 2013-05-15 NOTE — Telephone Encounter (Signed)
Patient called back and would like to know if her refill of Vicodin is going to be refilled?

## 2013-06-08 HISTORY — PX: TOTAL KNEE ARTHROPLASTY: SHX125

## 2013-06-19 ENCOUNTER — Ambulatory Visit: Payer: Self-pay | Admitting: General Practice

## 2013-06-19 ENCOUNTER — Encounter (INDEPENDENT_AMBULATORY_CARE_PROVIDER_SITE_OTHER): Payer: Self-pay

## 2013-06-19 ENCOUNTER — Encounter: Payer: Self-pay | Admitting: Internal Medicine

## 2013-06-19 ENCOUNTER — Ambulatory Visit (INDEPENDENT_AMBULATORY_CARE_PROVIDER_SITE_OTHER): Payer: BC Managed Care – PPO | Admitting: Internal Medicine

## 2013-06-19 VITALS — BP 142/90 | HR 81 | Temp 97.8°F | Wt 182.0 lb

## 2013-06-19 DIAGNOSIS — G8929 Other chronic pain: Secondary | ICD-10-CM

## 2013-06-19 DIAGNOSIS — I10 Essential (primary) hypertension: Secondary | ICD-10-CM

## 2013-06-19 LAB — SEDIMENTATION RATE: ERYTHROCYTE SED RATE: 10 mm/h (ref 0–30)

## 2013-06-19 LAB — URINALYSIS, COMPLETE
Bacteria: NONE SEEN
Bilirubin,UR: NEGATIVE
Blood: NEGATIVE
Glucose,UR: NEGATIVE mg/dL (ref 0–75)
Hyaline Cast: 9
Ketone: NEGATIVE
Leukocyte Esterase: NEGATIVE
Nitrite: NEGATIVE
PROTEIN: NEGATIVE
Ph: 6 (ref 4.5–8.0)
RBC,UR: 2 /HPF (ref 0–5)
Specific Gravity: 1.013 (ref 1.003–1.030)
WBC UR: NONE SEEN /HPF (ref 0–5)

## 2013-06-19 LAB — CBC
HCT: 38 % (ref 35.0–47.0)
HGB: 13.1 g/dL (ref 12.0–16.0)
MCH: 31.3 pg (ref 26.0–34.0)
MCHC: 34.4 g/dL (ref 32.0–36.0)
MCV: 91 fL (ref 80–100)
Platelet: 311 10*3/uL (ref 150–440)
RBC: 4.19 10*6/uL (ref 3.80–5.20)
RDW: 13 % (ref 11.5–14.5)
WBC: 7.6 10*3/uL (ref 3.6–11.0)

## 2013-06-19 LAB — BASIC METABOLIC PANEL
ANION GAP: 4 — AB (ref 7–16)
BUN: 14 mg/dL (ref 7–18)
CALCIUM: 9.1 mg/dL (ref 8.5–10.1)
Chloride: 100 mmol/L (ref 98–107)
Co2: 29 mmol/L (ref 21–32)
Creatinine: 0.6 mg/dL (ref 0.60–1.30)
EGFR (African American): >60
Glucose: 92 mg/dL (ref 65–99)
Osmolality: 266 (ref 275–301)
Potassium: 3.7 mmol/L (ref 3.5–5.1)
Sodium: 133 mmol/L — ABNORMAL LOW (ref 136–145)

## 2013-06-19 LAB — MRSA PCR SCREENING

## 2013-06-19 LAB — PROTIME-INR
INR: 1
Prothrombin Time: 12.8 secs (ref 11.5–14.7)

## 2013-06-19 LAB — APTT: Activated PTT: 32.6 secs (ref 23.6–35.9)

## 2013-06-19 MED ORDER — LOSARTAN POTASSIUM 100 MG PO TABS: 100.0000 mg | ORAL_TABLET | Freq: Every day | ORAL | Status: DC

## 2013-06-19 MED ORDER — HYDROCODONE-ACETAMINOPHEN 5-325 MG PO TABS: 1.0000 | ORAL_TABLET | Freq: Three times a day (TID) | ORAL | Status: DC | PRN

## 2013-06-19 NOTE — Progress Notes (Signed)
Pre-visit discussion using our clinic review tool. No additional management support is needed unless otherwise documented below in the visit note.  

## 2013-06-19 NOTE — Assessment & Plan Note (Signed)
Secondary to OA. Well controlled with Celebrex and prn Hydrocodone. Will continue.

## 2013-06-19 NOTE — Patient Instructions (Signed)
Increase Losartan to 100mg  daily.  Labs next week.  Follow up in 4 weeks.

## 2013-06-19 NOTE — Progress Notes (Signed)
Subjective:    Patient ID: Barbara Mclaughlin, female    DOB: 1950/11/06, 63 y.o.   MRN: [id number]  HPI 63YO female with h/o hypertension and OA presents for acute visit.  BP running high last several weeks. Pt checked and BP was 170/103 on one occasion, otherwise mostly near 140/90s. Notes some diffuse aching headache with elevated BP, however not persistent. Recently had some nasal congestion and took Sudafed prn. Not currently taking this medication. No fever, chills, cough, chest pain.j Compliant with current dose of Losartan.  Knee replacement surgery scheduled for 2/23. Some persistent pain in left knee and lower back. Symptoms have been well controlled with use of Celebrex and prn Hydrocodone.  Review of Systems  Constitutional: Negative for fever, chills, appetite change, fatigue and unexpected weight change.  HENT: Negative for congestion, ear pain, sinus pressure, sore throat, trouble swallowing and voice change.   Eyes: Negative for visual disturbance.  Respiratory: Negative for cough, shortness of breath, wheezing and stridor.   Cardiovascular: Negative for chest pain, palpitations and leg swelling.  Gastrointestinal: Negative for nausea, vomiting, abdominal pain, diarrhea, constipation, blood in stool, abdominal distention and anal bleeding.  Genitourinary: Negative for dysuria and flank pain.  Musculoskeletal: Positive for arthralgias, back pain and myalgias. Negative for gait problem and neck pain.  Skin: Negative for color change and rash.  Neurological: Positive for headaches. Negative for dizziness.  Hematological: Negative for adenopathy. Does not bruise/bleed easily.  Psychiatric/Behavioral: Negative for suicidal ideas, sleep disturbance and dysphoric mood. The patient is not nervous/anxious.        Objective:    BP 142/90  Pulse 81  Temp(Src) 97.8 F (36.6 C) (Oral)  Wt 182 lb (82.555 kg)  SpO2 96% Physical Exam  Constitutional: She is oriented to person,  place, and time. She appears well-developed and well-nourished. No distress.  HENT:  Head: Normocephalic and atraumatic.  Right Ear: External ear normal.  Left Ear: External ear normal.  Nose: Nose normal.  Mouth/Throat: Oropharynx is clear and moist. No oropharyngeal exudate.  Eyes: Conjunctivae are normal. Pupils are equal, round, and reactive to light. Right eye exhibits no discharge. Left eye exhibits no discharge. No scleral icterus.  Neck: Normal range of motion. Neck supple. No tracheal deviation present. No thyromegaly present.  Cardiovascular: Normal rate, regular rhythm, normal heart sounds and intact distal pulses.  Exam reveals no gallop and no friction rub.   No murmur heard. Pulmonary/Chest: Effort normal and breath sounds normal. No accessory muscle usage. Not tachypneic. No respiratory distress. She has no decreased breath sounds. She has no wheezes. She has no rhonchi. She has no rales. She exhibits no tenderness.  Musculoskeletal: Normal range of motion. She exhibits no edema and no tenderness.  Lymphadenopathy:    She has no cervical adenopathy.  Neurological: She is alert and oriented to person, place, and time. No cranial nerve deficit. She exhibits normal muscle tone. Coordination normal.  Skin: Skin is warm and dry. No rash noted. She is not diaphoretic. No erythema. No pallor.  Psychiatric: She has a normal mood and affect. Her behavior is normal. Judgment and thought content normal.          Assessment & Plan:   Problem List Items Addressed This Visit   Chronic pain     Secondary to OA. Well controlled with Celebrex and prn Hydrocodone. Will continue.    Relevant Medications      HYDROcodone-acetaminophen (NORCO/VICODIN) 5-325 MG per tablet   Hypertension - Primary  BP Readings from Last 3 Encounters:  06/19/13 142/90  04/29/13 140/98  01/13/13 132/90   BP elevated recently. Likely exacerbated by use of Sudafed. Encouraged her to avoid Sudafed. Will  increase Losartan to 100mg  daily. Recheck Cr and K in 1 week. She will monitor BP at home and call if persistently elevated >140/90. Follow up 4 weeks and prn.    Relevant Medications      losartan (COZAAR) tablet   Other Relevant Orders      Basic Metabolic Panel (BMET)       Return in about 4 weeks (around 07/17/2013) for Recheck of Blood Pressure.

## 2013-06-19 NOTE — Assessment & Plan Note (Signed)
BP Readings from Last 3 Encounters:  06/19/13 142/90  04/29/13 140/98  01/13/13 132/90   BP elevated recently. Likely exacerbated by use of Sudafed. Encouraged her to avoid Sudafed. Will increase Losartan to 100mg  daily. Recheck Cr and K in 1 week. She will monitor BP at home and call if persistently elevated >140/90. Follow up 4 weeks and prn.

## 2013-06-20 ENCOUNTER — Telehealth: Payer: Self-pay | Admitting: Internal Medicine

## 2013-06-20 LAB — URINE CULTURE

## 2013-06-20 NOTE — Telephone Encounter (Signed)
Relevant patient education assigned to patient using Emmi. ° °

## 2013-06-24 ENCOUNTER — Encounter: Payer: Self-pay | Admitting: Internal Medicine

## 2013-06-27 ENCOUNTER — Other Ambulatory Visit: Payer: BC Managed Care – PPO

## 2013-06-27 ENCOUNTER — Telehealth: Payer: Self-pay | Admitting: *Deleted

## 2013-06-27 NOTE — Telephone Encounter (Signed)
Patient left message earlier today because she missed her lab appointment. They were without water and power. The only reason she was scheduled to have labs done because the plan was her to increase her blood pressure medication. However she did not increase her medication instead she just stopped taking the decongestant and that seems to have helped with her blood pressure. Since she did not increase medication, she does not see any reason for her to come in for the labs. Also she wanted to make you aware she is having her knee replacement surgery on Monday.

## 2013-06-30 ENCOUNTER — Inpatient Hospital Stay: Payer: Self-pay | Admitting: General Practice

## 2013-07-01 LAB — BASIC METABOLIC PANEL
Anion Gap: 7 (ref 7–16)
BUN: 13 mg/dL (ref 7–18)
CALCIUM: 8.4 mg/dL — AB (ref 8.5–10.1)
CHLORIDE: 96 mmol/L — AB (ref 98–107)
Co2: 28 mmol/L (ref 21–32)
Creatinine: 0.73 mg/dL (ref 0.60–1.30)
EGFR (African American): >60
EGFR (Non-African Amer.): >60
GLUCOSE: 119 mg/dL — AB (ref 65–99)
OSMOLALITY: 264 (ref 275–301)
POTASSIUM: 3.8 mmol/L (ref 3.5–5.1)
Sodium: 131 mmol/L — ABNORMAL LOW (ref 136–145)

## 2013-07-01 LAB — HEMOGLOBIN: HGB: 9.9 g/dL — AB (ref 12.0–16.0)

## 2013-07-01 LAB — PLATELET COUNT: Platelet: 252 10*3/uL (ref 150–440)

## 2013-07-02 LAB — BASIC METABOLIC PANEL
Anion Gap: 3 — ABNORMAL LOW (ref 7–16)
BUN: 5 mg/dL — ABNORMAL LOW (ref 7–18)
CALCIUM: 8.5 mg/dL (ref 8.5–10.1)
CREATININE: 0.55 mg/dL — AB (ref 0.60–1.30)
Chloride: 99 mmol/L (ref 98–107)
Co2: 31 mmol/L (ref 21–32)
EGFR (African American): >60
EGFR (Non-African Amer.): >60
Glucose: 90 mg/dL (ref 65–99)
OSMOLALITY: 263 (ref 275–301)
Potassium: 3.2 mmol/L — ABNORMAL LOW (ref 3.5–5.1)
Sodium: 133 mmol/L — ABNORMAL LOW (ref 136–145)

## 2013-07-02 LAB — HEMOGLOBIN: HGB: 9.1 g/dL — ABNORMAL LOW (ref 12.0–16.0)

## 2013-07-02 LAB — PLATELET COUNT: Platelet: 206 10*3/uL (ref 150–440)

## 2013-07-14 ENCOUNTER — Ambulatory Visit: Payer: BC Managed Care – PPO | Admitting: Internal Medicine

## 2013-08-05 ENCOUNTER — Ambulatory Visit (INDEPENDENT_AMBULATORY_CARE_PROVIDER_SITE_OTHER): Payer: BC Managed Care – PPO | Admitting: Internal Medicine

## 2013-08-05 ENCOUNTER — Encounter: Payer: Self-pay | Admitting: Internal Medicine

## 2013-08-05 VITALS — BP 148/98 | HR 77 | Temp 98.0°F | Wt 184.0 lb

## 2013-08-05 DIAGNOSIS — Z1211 Encounter for screening for malignant neoplasm of colon: Secondary | ICD-10-CM

## 2013-08-05 DIAGNOSIS — G8929 Other chronic pain: Secondary | ICD-10-CM

## 2013-08-05 DIAGNOSIS — K219 Gastro-esophageal reflux disease without esophagitis: Secondary | ICD-10-CM

## 2013-08-05 DIAGNOSIS — I10 Essential (primary) hypertension: Secondary | ICD-10-CM

## 2013-08-05 MED ORDER — HYDROCODONE-ACETAMINOPHEN 5-325 MG PO TABS: 1.0000 | ORAL_TABLET | Freq: Three times a day (TID) | ORAL | Status: DC | PRN

## 2013-08-05 MED ORDER — CELECOXIB 100 MG PO CAPS: 100.0000 mg | ORAL_CAPSULE | Freq: Two times a day (BID) | ORAL | Status: DC

## 2013-08-05 NOTE — Assessment & Plan Note (Signed)
Secondary to OA bilateral knees. Now s/p bilateral knee replacement (recently right knee replacement) with some lingering low back and right hip pain. Will continue Celebrex and prn Vicodin for severe pain. Follow up with orthopedics as scheduled.

## 2013-08-05 NOTE — Assessment & Plan Note (Signed)
Chronic symptoms of reflux. Minimal improvement with Nexium and Zantac. Will set up GI evaluation. Question if upper endoscopy might be helpful.

## 2013-08-05 NOTE — Progress Notes (Signed)
Subjective:    Patient ID: Barbara Mclaughlin, female    DOB: 1950-08-15, 63 y.o.   MRN: [id number]  HPI 63YO female presents for follow up.  S/p right knee replacement in 06/2013. Having some low back and right hip pain. Improved with Ibuprofen and Vicodin. Finished PT. Has follow up 4/7.  Also notes some persistent epigastric pain and reflux symptoms. Taking Nexium in the morning and Zantac at night with some improvement. No nausea, vomiting, change in appetite.  Did not increase losartan at last visit. No chest pain, headache, or palpitations. Compliant with 50mg  dosing.    Review of Systems  Constitutional: Negative for fever, chills, appetite change, fatigue and unexpected weight change.  HENT: Negative for congestion, ear pain, sinus pressure, sore throat, trouble swallowing and voice change.   Eyes: Negative for visual disturbance.  Respiratory: Negative for cough, shortness of breath, wheezing and stridor.   Cardiovascular: Negative for chest pain, palpitations and leg swelling.  Gastrointestinal: Positive for abdominal pain (occasional epigastric discomfort). Negative for nausea, vomiting, diarrhea, constipation, blood in stool, abdominal distention and anal bleeding.  Genitourinary: Negative for dysuria and flank pain.  Musculoskeletal: Positive for arthralgias, back pain and myalgias. Negative for gait problem and neck pain.  Skin: Negative for color change and rash.  Neurological: Negative for dizziness and headaches.  Hematological: Negative for adenopathy. Does not bruise/bleed easily.  Psychiatric/Behavioral: Negative for suicidal ideas, sleep disturbance and dysphoric mood. The patient is not nervous/anxious.        Objective:    BP 148/98  Pulse 77  Temp(Src) 98 F (36.7 C) (Oral)  Wt 184 lb (83.462 kg)  SpO2 96% Physical Exam  Constitutional: She is oriented to person, place, and time. She appears well-developed and well-nourished. No distress.  HENT:    Head: Normocephalic and atraumatic.  Right Ear: External ear normal.  Left Ear: External ear normal.  Nose: Nose normal.  Mouth/Throat: Oropharynx is clear and moist. No oropharyngeal exudate.  Eyes: Conjunctivae are normal. Pupils are equal, round, and reactive to light. Right eye exhibits no discharge. Left eye exhibits no discharge. No scleral icterus.  Neck: Normal range of motion. Neck supple. No tracheal deviation present. No thyromegaly present.  Cardiovascular: Normal rate, regular rhythm, normal heart sounds and intact distal pulses.  Exam reveals no gallop and no friction rub.   No murmur heard. Pulmonary/Chest: Effort normal and breath sounds normal. No accessory muscle usage. Not tachypneic. No respiratory distress. She has no decreased breath sounds. She has no wheezes. She has no rhonchi. She has no rales. She exhibits no tenderness.  Musculoskeletal: Normal range of motion. She exhibits no edema and no tenderness.       Right knee: She exhibits normal range of motion and no swelling.       Legs: Lymphadenopathy:    She has no cervical adenopathy.  Neurological: She is alert and oriented to person, place, and time. No cranial nerve deficit. She exhibits normal muscle tone. Coordination normal.  Skin: Skin is warm and dry. No rash noted. She is not diaphoretic. No erythema. No pallor.  Psychiatric: She has a normal mood and affect. Her behavior is normal. Judgment and thought content normal.          Assessment & Plan:   Problem List Items Addressed This Visit   Chronic pain - Primary     Secondary to OA bilateral knees. Now s/p bilateral knee replacement (recently right knee replacement) with some lingering low back and  right hip pain. Will continue Celebrex and prn Vicodin for severe pain. Follow up with orthopedics as scheduled.    Relevant Medications      celecoxib (CELEBREX) capsule      HYDROcodone-acetaminophen (NORCO/VICODIN) 5-325 MG per tablet   GERD  (gastroesophageal reflux disease)     Chronic symptoms of reflux. Minimal improvement with Nexium and Zantac. Will set up GI evaluation. Question if upper endoscopy might be helpful.    Hypertension      BP Readings from Last 3 Encounters:  08/05/13 148/98  06/19/13 142/90  04/29/13 140/98   BP slightly elevated. Pt did not increase losartan to 100mg  daily at last visit. BP has been generally well controlled on 50mg  dosing at home. Will continue to monitor. She will email with BP readings. Plan to increase losartan to 100mg  daily if BP consistently >140/90.    Relevant Medications      losartan (COZAAR) 50 MG tablet    Other Visit Diagnoses   Screening for colon cancer        Relevant Orders       Ambulatory referral to Gastroenterology        Return in about 6 months (around 02/04/2014) for Physical.

## 2013-08-05 NOTE — Patient Instructions (Signed)
Monitor blood pressure weekly. Email with readings.

## 2013-08-05 NOTE — Assessment & Plan Note (Signed)
BP Readings from Last 3 Encounters:  08/05/13 148/98  06/19/13 142/90  04/29/13 140/98   BP slightly elevated. Pt did not increase losartan to 100mg  daily at last visit. BP has been generally well controlled on 50mg  dosing at home. Will continue to monitor. She will email with BP readings. Plan to increase losartan to 100mg  daily if BP consistently >140/90.

## 2013-08-05 NOTE — Progress Notes (Signed)
Pre visit review using our clinic review tool, if applicable. No additional management support is needed unless otherwise documented below in the visit note. 

## 2013-09-11 ENCOUNTER — Telehealth: Payer: Self-pay | Admitting: Emergency Medicine

## 2013-09-11 DIAGNOSIS — G8929 Other chronic pain: Secondary | ICD-10-CM

## 2013-09-11 NOTE — Telephone Encounter (Signed)
HYDROcodone-acetaminophen (NORCO/VICODIN) 5-325 MG per  ° °

## 2013-09-15 MED ORDER — HYDROCODONE-ACETAMINOPHEN 5-325 MG PO TABS: 1.0000 | ORAL_TABLET | Freq: Three times a day (TID) | ORAL | Status: DC | PRN

## 2013-09-15 NOTE — Telephone Encounter (Signed)
Pt called looking for requested Rx

## 2013-09-15 NOTE — Telephone Encounter (Signed)
Fine to refill 

## 2013-09-15 NOTE — Telephone Encounter (Signed)
Advised pt that Rx was ready for pick up - put Rx in folder for pick up

## 2013-09-15 NOTE — Telephone Encounter (Signed)
Rx printed

## 2013-09-16 DIAGNOSIS — M545 Low back pain, unspecified: Secondary | ICD-10-CM | POA: Insufficient documentation

## 2013-09-23 ENCOUNTER — Other Ambulatory Visit: Payer: Self-pay | Admitting: Internal Medicine

## 2013-10-15 ENCOUNTER — Telehealth: Payer: Self-pay | Admitting: Internal Medicine

## 2013-10-15 DIAGNOSIS — G8929 Other chronic pain: Secondary | ICD-10-CM

## 2013-10-15 MED ORDER — HYDROCODONE-ACETAMINOPHEN 5-325 MG PO TABS: 1.0000 | ORAL_TABLET | Freq: Three times a day (TID) | ORAL | Status: DC | PRN

## 2013-10-15 NOTE — Telephone Encounter (Signed)
Fine to refill 

## 2013-10-15 NOTE — Telephone Encounter (Signed)
Rx printed

## 2013-10-15 NOTE — Telephone Encounter (Signed)
Rx put in folder ready for pick up

## 2013-10-15 NOTE — Telephone Encounter (Signed)
Pt asking for refill on hydrocodone.  States she just took her last one.

## 2013-11-11 ENCOUNTER — Telehealth: Payer: Self-pay | Admitting: *Deleted

## 2013-11-11 DIAGNOSIS — G8929 Other chronic pain: Secondary | ICD-10-CM

## 2013-11-11 NOTE — Telephone Encounter (Signed)
OK We can refill Hydrocodone x 1. I will place referral to pain management.

## 2013-11-11 NOTE — Telephone Encounter (Signed)
Spoke with pt, she states she is taking Hydrocodone twice a day everyday for pain.  Advised of pain management consideration, pt is in agreement.

## 2013-11-11 NOTE — Telephone Encounter (Signed)
Pt called requesting Hydrocodone refill.  Last refill 6.10.15, last OV 3.31.15.  Please advise refill.

## 2013-11-11 NOTE — Telephone Encounter (Signed)
Spoke with pt advised of MDs message 

## 2013-11-11 NOTE — Telephone Encounter (Signed)
It is a few days early for a refill. Is she taking Hydrocodone twice daily every day for pain? If yes, we need to consider referral to pain management for long acting pain medication.

## 2013-11-11 NOTE — Addendum Note (Signed)
Addended by: Ronette Deter A on: 11/11/2013 04:29 PM   Modules accepted: Orders

## 2013-11-14 ENCOUNTER — Other Ambulatory Visit: Payer: Self-pay | Admitting: Internal Medicine

## 2013-11-14 DIAGNOSIS — G8929 Other chronic pain: Secondary | ICD-10-CM

## 2013-11-14 MED ORDER — HYDROCODONE-ACETAMINOPHEN 5-325 MG PO TABS: 1.0000 | ORAL_TABLET | Freq: Three times a day (TID) | ORAL | Status: DC | PRN

## 2013-11-26 ENCOUNTER — Telehealth: Payer: Self-pay | Admitting: Internal Medicine

## 2013-11-26 ENCOUNTER — Telehealth: Payer: Self-pay | Admitting: *Deleted

## 2013-11-26 DIAGNOSIS — G8929 Other chronic pain: Secondary | ICD-10-CM

## 2013-11-26 MED ORDER — CELECOXIB 100 MG PO CAPS: 100.0000 mg | ORAL_CAPSULE | Freq: Two times a day (BID) | ORAL | Status: DC

## 2013-11-26 MED ORDER — LOSARTAN POTASSIUM 50 MG PO TABS: 50.0000 mg | ORAL_TABLET | Freq: Every day | ORAL | Status: DC

## 2013-11-26 MED ORDER — FLUOXETINE HCL 20 MG PO CAPS: ORAL_CAPSULE | ORAL | Status: DC

## 2013-11-26 NOTE — Telephone Encounter (Signed)
Fine to refill both 

## 2013-11-26 NOTE — Telephone Encounter (Signed)
Pt stopped by to request a 3 month supply of all meds (Celebrex, Prozac and Cozaar). Pt is going on a [place] country trip starting in August. Please advise pt/msn

## 2013-11-26 NOTE — Telephone Encounter (Signed)
Pt came in to office states she is leaving for a 3 month trip on 8.15.15.  She is requesting a 90 day Rx on Losartan last refill 7.22.15, Fluoxetine last refill 7.22.15, and Celebrex last refill 3.31.15.  Please advise refill.

## 2013-12-01 ENCOUNTER — Other Ambulatory Visit: Payer: Self-pay | Admitting: Internal Medicine

## 2013-12-01 NOTE — Telephone Encounter (Signed)
Medication not found on current med list.  Please advise refill.

## 2013-12-04 NOTE — Telephone Encounter (Signed)
Pt left vm requesting this medication to be refilled

## 2013-12-08 ENCOUNTER — Telehealth: Payer: Self-pay | Admitting: *Deleted

## 2013-12-08 NOTE — Telephone Encounter (Signed)
Fax from [facility], [place] needing PA. Spoke with pt, she has used Premarin and Elestrin in the past, both were ineffective for atrophic vaginitis. Began PA online.

## 2013-12-11 ENCOUNTER — Telehealth: Payer: Self-pay | Admitting: *Deleted

## 2013-12-11 ENCOUNTER — Telehealth: Payer: Self-pay | Admitting: Internal Medicine

## 2013-12-11 DIAGNOSIS — G8929 Other chronic pain: Secondary | ICD-10-CM

## 2013-12-11 MED ORDER — HYDROCODONE-ACETAMINOPHEN 5-325 MG PO TABS: 1.0000 | ORAL_TABLET | Freq: Three times a day (TID) | ORAL | Status: DC | PRN

## 2013-12-11 NOTE — Telephone Encounter (Signed)
Pt called requesting Hydrocodone refill.  Pt states [facility] has been sent to [facility] however the MD is on vacation and she will not be seen before she leaves.  please advise refill.

## 2013-12-11 NOTE — Telephone Encounter (Signed)
Spoke with pt advised Rx ready for pick up

## 2013-12-11 NOTE — Telephone Encounter (Signed)
Ok to refill 

## 2013-12-11 NOTE — Telephone Encounter (Signed)
Told to call pharmacy.

## 2013-12-11 NOTE — Telephone Encounter (Signed)
Pt called in and was requesting her 3 month med

## 2013-12-12 MED ORDER — ESTRADIOL 10 MCG VA TABS: 10.0000 ug | ORAL_TABLET | VAGINAL | Status: DC

## 2013-12-12 NOTE — Telephone Encounter (Signed)
Estring coverage denied, needs to have tried two therapeutic equivalent medications and were ineffective or deemed detrimental to [employer] health. Only one alternative has been used, Premarin. Other options include Estrace vaginal cream and vagifem.

## 2013-12-12 NOTE — Telephone Encounter (Signed)
Spoke with pt, willing to try Vagifem, send to [facility]. How do you want directions to read?

## 2013-12-12 NOTE — Addendum Note (Signed)
Addended by: Ronette Deter A on: 12/12/2013 11:48 AM   Modules accepted: Orders

## 2013-12-12 NOTE — Telephone Encounter (Signed)
I have called in Rx

## 2014-01-19 ENCOUNTER — Encounter: Payer: BC Managed Care – PPO | Admitting: Internal Medicine

## 2014-02-02 ENCOUNTER — Ambulatory Visit: Payer: Self-pay | Admitting: Anesthesiology

## 2014-02-09 ENCOUNTER — Encounter: Payer: Self-pay | Admitting: *Deleted

## 2014-02-09 ENCOUNTER — Encounter: Payer: Self-pay | Admitting: Internal Medicine

## 2014-02-09 ENCOUNTER — Ambulatory Visit (INDEPENDENT_AMBULATORY_CARE_PROVIDER_SITE_OTHER): Payer: BC Managed Care – PPO | Admitting: Internal Medicine

## 2014-02-09 VITALS — BP 130/84 | HR 76 | Temp 97.7°F | Ht 66.5 in | Wt 182.8 lb

## 2014-02-09 DIAGNOSIS — N959 Unspecified menopausal and perimenopausal disorder: Secondary | ICD-10-CM

## 2014-02-09 DIAGNOSIS — Z1239 Encounter for other screening for malignant neoplasm of breast: Secondary | ICD-10-CM

## 2014-02-09 DIAGNOSIS — M1990 Unspecified osteoarthritis, unspecified site: Secondary | ICD-10-CM

## 2014-02-09 DIAGNOSIS — Z23 Encounter for immunization: Secondary | ICD-10-CM

## 2014-02-09 DIAGNOSIS — G8929 Other chronic pain: Secondary | ICD-10-CM

## 2014-02-09 DIAGNOSIS — I10 Essential (primary) hypertension: Secondary | ICD-10-CM

## 2014-02-09 LAB — COMPREHENSIVE METABOLIC PANEL
ALT: 18 U/L (ref 0–35)
AST: 24 U/L (ref 0–37)
Albumin: 4.1 g/dL (ref 3.5–5.2)
Alkaline Phosphatase: 71 U/L (ref 39–117)
BUN: 13 mg/dL (ref 6–23)
CALCIUM: 9.1 mg/dL (ref 8.4–10.5)
CHLORIDE: 100 meq/L (ref 96–112)
CO2: 28 meq/L (ref 19–32)
Creatinine, Ser: 0.6 mg/dL (ref 0.4–1.2)
GFR: 101.06 mL/min (ref >60.00)
Glucose, Bld: 85 mg/dL (ref 70–99)
Potassium: 3.9 mEq/L (ref 3.5–5.1)
SODIUM: 134 meq/L — AB (ref 135–145)
TOTAL PROTEIN: 7.2 g/dL (ref 6.0–8.3)
Total Bilirubin: 0.3 mg/dL (ref 0.2–1.2)

## 2014-02-09 LAB — HM PAP SMEAR

## 2014-02-09 LAB — HM COLONOSCOPY

## 2014-02-09 LAB — LIPID PANEL
Cholesterol: 188 mg/dL (ref 0–200)
HDL: 50.3 mg/dL (ref >39.00)
LDL Cholesterol: 109 mg/dL — ABNORMAL HIGH (ref 0–99)
NonHDL: 137.7
Total CHOL/HDL Ratio: 4
Triglycerides: 145 mg/dL (ref 0.0–149.0)
VLDL: 29 mg/dL (ref 0.0–40.0)

## 2014-02-09 MED ORDER — HYDROCODONE-ACETAMINOPHEN 5-325 MG PO TABS: 1.0000 | ORAL_TABLET | Freq: Three times a day (TID) | ORAL | Status: DC | PRN

## 2014-02-09 NOTE — Assessment & Plan Note (Signed)
BP Readings from Last 3 Encounters:  02/09/14 130/84  08/05/13 148/98  06/19/13 142/90   BP well controlled on Losartan. Renal function with labs today.

## 2014-02-09 NOTE — Assessment & Plan Note (Signed)
Will continue Celebrex. Stop Bufferin given h/o gastric bypass. Continue prn Vicodin.

## 2014-02-09 NOTE — Patient Instructions (Signed)
Labs today.  Follow up in 6 months. 

## 2014-02-09 NOTE — Progress Notes (Signed)
Pre visit review using our clinic review tool, if applicable. No additional management support is needed unless otherwise documented below in the visit note. 

## 2014-02-09 NOTE — Progress Notes (Signed)
Subjective:    Patient ID: Barbara Mclaughlin, female    DOB: 1950-10-01, 63 y.o.   MRN: [id number]  HPI 63YO female presents for follow up.  Chronic pain - Seen at Pain Management. Recommended epidural injection, declined. Currently taking Celebrex twice daily and Bufferin OTC.  Not using Vicodin at present. Pain most severe in lower back and in joints of hands. Worse with colder weather.  HTN - BP running 130/80s. Compliant with Losartan. No chest pain, headache.  Started using Estradiol for vaginal dryness and notes some improvement with this.  Review of Systems  Constitutional: Negative for fever, chills, appetite change, fatigue and unexpected weight change.  Eyes: Negative for visual disturbance.  Respiratory: Negative for shortness of breath.   Cardiovascular: Negative for chest pain and leg swelling.  Gastrointestinal: Negative for abdominal pain, diarrhea and constipation.  Musculoskeletal: Positive for arthralgias, back pain and myalgias.  Skin: Negative for color change and rash.  Hematological: Negative for adenopathy. Does not bruise/bleed easily.  Psychiatric/Behavioral: Negative for dysphoric mood. The patient is not nervous/anxious.        Objective:    BP 130/84  Pulse 76  Temp(Src) 97.7 F (36.5 C) (Oral)  Ht 5' 6.5" (1.689 m)  Wt 182 lb 12 oz (82.895 kg)  BMI 29.06 kg/m2  SpO2 96% Physical Exam  Constitutional: She is oriented to person, place, and time. She appears well-developed and well-nourished. No distress.  HENT:  Head: Normocephalic and atraumatic.  Right Ear: External ear normal.  Left Ear: External ear normal.  Nose: Nose normal.  Mouth/Throat: Oropharynx is clear and moist. No oropharyngeal exudate.  Eyes: Conjunctivae are normal. Pupils are equal, round, and reactive to light. Right eye exhibits no discharge. Left eye exhibits no discharge. No scleral icterus.  Neck: Normal range of motion. Neck supple. No tracheal deviation present. No  thyromegaly present.  Cardiovascular: Normal rate, regular rhythm, normal heart sounds and intact distal pulses.  Exam reveals no gallop and no friction rub.   No murmur heard. Pulmonary/Chest: Effort normal and breath sounds normal. No accessory muscle usage. Not tachypneic. No respiratory distress. She has no decreased breath sounds. She has no wheezes. She has no rhonchi. She has no rales. She exhibits no tenderness.  Musculoskeletal: She exhibits no edema.       Right hand: She exhibits decreased range of motion and tenderness.       Left hand: She exhibits decreased range of motion and tenderness.  Thickened joints bilateral hands  Lymphadenopathy:    She has no cervical adenopathy.  Neurological: She is alert and oriented to person, place, and time. No cranial nerve deficit. She exhibits normal muscle tone. Coordination normal.  Skin: Skin is warm and dry. No rash noted. She is not diaphoretic. No erythema. No pallor.  Psychiatric: She has a normal mood and affect. Her behavior is normal. Judgment and thought content normal.          Assessment & Plan:   Problem List Items Addressed This Visit     Unprioritized   Arthritis     Will continue Celebrex. Stop Bufferin given h/o gastric bypass. Continue prn Vicodin.    Relevant Medications      HYDROcodone-acetaminophen (NORCO/VICODIN) 5-325 MG per tablet   Chronic pain     Secondary to OA. Will continue Celebrex and use prn Vicodin only for severe pain. Discussed potential risks of this medication. Stop Bufferin given h/o gastric bypass.    Relevant Medications  HYDROcodone-acetaminophen (NORCO/VICODIN) 5-325 MG per tablet   Hypertension - Primary      BP Readings from Last 3 Encounters:  02/09/14 130/84  08/05/13 148/98  06/19/13 142/90   BP well controlled on Losartan. Renal function with labs today.    Relevant Orders      Comprehensive metabolic panel      Lipid panel   Screening for breast cancer   Relevant  Orders      MM Digital Screening       Return for Physical.

## 2014-02-09 NOTE — Assessment & Plan Note (Signed)
Vaginal dryness improved with Estradiol vaginally. Will continue.

## 2014-02-09 NOTE — Assessment & Plan Note (Signed)
Secondary to OA. Will continue Celebrex and use prn Vicodin only for severe pain. Discussed potential risks of this medication. Stop Bufferin given h/o gastric bypass.

## 2014-03-11 ENCOUNTER — Ambulatory Visit: Payer: Self-pay | Admitting: Internal Medicine

## 2014-03-11 LAB — HM MAMMOGRAPHY: HM MAMMO: NEGATIVE

## 2014-03-12 ENCOUNTER — Ambulatory Visit: Payer: Self-pay | Admitting: Gastroenterology

## 2014-03-13 ENCOUNTER — Encounter: Payer: Self-pay | Admitting: *Deleted

## 2014-03-22 ENCOUNTER — Other Ambulatory Visit: Payer: Self-pay | Admitting: Internal Medicine

## 2014-03-26 LAB — HM COLONOSCOPY: HM Colonoscopy: NORMAL

## 2014-03-27 ENCOUNTER — Telehealth: Payer: Self-pay

## 2014-03-27 ENCOUNTER — Telehealth: Payer: Self-pay | Admitting: Internal Medicine

## 2014-03-27 DIAGNOSIS — G8929 Other chronic pain: Secondary | ICD-10-CM

## 2014-03-27 MED ORDER — HYDROCODONE-ACETAMINOPHEN 5-325 MG PO TABS: 1.0000 | ORAL_TABLET | Freq: Three times a day (TID) | ORAL | Status: DC | PRN

## 2014-03-27 NOTE — Telephone Encounter (Signed)
Patient notified Rx ready for pick up. Patient verbalized understanding. 

## 2014-03-27 NOTE — Telephone Encounter (Signed)
HYDROcodone-acetaminophen (NORCO/VICODIN) 5-325 MG

## 2014-03-27 NOTE — Telephone Encounter (Signed)
Rx printed and given to Dr. Gilford Rile for signature.

## 2014-03-27 NOTE — Telephone Encounter (Signed)
OK to fill

## 2014-03-27 NOTE — Telephone Encounter (Signed)
Is it ok to fill? 

## 2014-04-01 ENCOUNTER — Telehealth: Payer: Self-pay

## 2014-04-01 NOTE — Telephone Encounter (Signed)
The patient called hoping to speak to Dr.Walker directly regarding her daughter, Barbara Mclaughlin (MRN- [id number]).  She had a new pt apt and arrived late.  The mother is insisting the patient did not arrive late.  I advised the patient of our office policies, however, she still insisted a note be sent.

## 2014-04-07 ENCOUNTER — Encounter: Payer: Self-pay | Admitting: Internal Medicine

## 2014-04-08 ENCOUNTER — Encounter: Payer: Self-pay | Admitting: Internal Medicine

## 2014-04-08 NOTE — Telephone Encounter (Signed)
Spoke to pt, appt scheduled for tomorrow with Morey Hummingbird.

## 2014-04-09 ENCOUNTER — Other Ambulatory Visit (INDEPENDENT_AMBULATORY_CARE_PROVIDER_SITE_OTHER): Payer: BC Managed Care – PPO

## 2014-04-09 ENCOUNTER — Encounter: Payer: Self-pay | Admitting: Nurse Practitioner

## 2014-04-09 ENCOUNTER — Ambulatory Visit (INDEPENDENT_AMBULATORY_CARE_PROVIDER_SITE_OTHER): Payer: BC Managed Care – PPO | Admitting: Nurse Practitioner

## 2014-04-09 VITALS — BP 122/78 | HR 85 | Temp 97.5°F | Resp 14 | Ht 66.5 in | Wt 185.2 lb

## 2014-04-09 DIAGNOSIS — R42 Dizziness and giddiness: Secondary | ICD-10-CM

## 2014-04-09 DIAGNOSIS — D509 Iron deficiency anemia, unspecified: Secondary | ICD-10-CM

## 2014-04-09 DIAGNOSIS — I10 Essential (primary) hypertension: Secondary | ICD-10-CM

## 2014-04-09 DIAGNOSIS — Z79899 Other long term (current) drug therapy: Secondary | ICD-10-CM

## 2014-04-09 LAB — BASIC METABOLIC PANEL
BUN: 11 mg/dL (ref 6–23)
CALCIUM: 8.8 mg/dL (ref 8.4–10.5)
CO2: 25 meq/L (ref 19–32)
CREATININE: 0.7 mg/dL (ref 0.4–1.2)
Chloride: 103 mEq/L (ref 96–112)
GFR: 95.73 mL/min (ref >60.00)
GLUCOSE: 72 mg/dL (ref 70–99)
Potassium: 4.1 mEq/L (ref 3.5–5.1)
Sodium: 139 mEq/L (ref 135–145)

## 2014-04-09 LAB — CBC WITH DIFFERENTIAL/PLATELET
BASOS ABS: 0.1 10*3/uL (ref 0.0–0.1)
Basophils Relative: 1.4 % (ref 0.0–3.0)
EOS ABS: 0.1 10*3/uL (ref 0.0–0.7)
Eosinophils Relative: 1.1 % (ref 0.0–5.0)
HEMATOCRIT: 37.3 % (ref 36.0–46.0)
Hemoglobin: 12.5 g/dL (ref 12.0–15.0)
Lymphocytes Relative: 20.5 % (ref 12.0–46.0)
Lymphs Abs: 1.2 10*3/uL (ref 0.7–4.0)
MCHC: 33.4 g/dL (ref 30.0–36.0)
MCV: 90.5 fl (ref 78.0–100.0)
Monocytes Absolute: 0.6 10*3/uL (ref 0.1–1.0)
Monocytes Relative: 10.9 % (ref 3.0–12.0)
Neutro Abs: 3.9 10*3/uL (ref 1.4–7.7)
Neutrophils Relative %: 66.1 % (ref 43.0–77.0)
PLATELETS: 301 10*3/uL (ref 150.0–400.0)
RBC: 4.12 Mil/uL (ref 3.87–5.11)
RDW: 12.5 % (ref 11.5–15.5)
WBC: 6 10*3/uL (ref 4.0–10.5)

## 2014-04-09 NOTE — Patient Instructions (Signed)
Make appointment with [facility] to evaluate ear ringing/vertigo symptoms.  Please stop by lab before leaving today Notify us if you have any more of these episodes or new onset symptoms   Call 911 if you have the following:  Stroke warning signs are:  Sudden blurred or altered vision  Weakness (usually on one side)  Numbness (usually on one side)  Difficulty speaking or understanding speech  Dizziness, imbalance You can reduce your chances of suffering a stroke by remembering the 3Rs:  Reduce risk  Recognize stroke symptoms early  Respond by calling 911 and seeking emergency treatment within the first 3 hours If you think someone is having a stroke is showing stroke symptoms, call 911 FAST:  F=FACE. Ask the person to smile. Does one side of the face drop?  A=ARM. Ask the person to raise both arms. Does one arm drift downward?  S=SPEECH. Ask the person to speak a simple sentence. Does the speech sound slurred or strange?  T=TIME to call 911. If you observe any of these signs call 911 or get to the nearest hospital.

## 2014-04-09 NOTE — Assessment & Plan Note (Signed)
Patient had been taking iron due to previous lab work. She had Endoscopy/Colonoscopy in November. They requested she stop the iron and recheck levels at visit today. Obtain CBC w/ diff and TIBC

## 2014-04-09 NOTE — Progress Notes (Signed)
Subjective:    Patient ID: Barbara Mclaughlin, female    DOB: 09-Apr-1951, 63 y.o.   MRN: [id number]  HPI  Barbara Mclaughlin is a 63 yo female here with a CC of dizziness and a recent episode.  1) Began Dec. 1st at 7:30 pm. She was sitting in a chair with a heating pad under a neck pillow. He neck was bothering her that day. She also said she was not feeling well generally and mentioned this to her husband. The incident was she had sudden onset of light-headedness, could not comprehend what husband was saying to her, felt the urge to urinate, felt hot, and wanted to lie down. This lasted a total of 15 min. She felt fine afterwards and had no more episodes since then. She took a 81 mg ASA afterwards thinking this was a "mini-stroke". She denied numbness or weakness on a side, inability to speak, change in vision, dropping of facial features at that time.   Before incident took 1/4 tsp of tumeric mixed with peanut butter for neck pain earlier that day. She took a vicodin at 3 pm also for the pain. Didn't feel well that day- made comment to husband  Due for steroid shot in back tomorrow at [facility].  Past few weeks prior to 12/1 had dizziness (looking up, turning over in bed, bending over) Intermittently. She denied room spinning, but has had maneuver to relieve vertigo in past with success.  Tinnitus Left ear is louder lately   2) Colonoscopy/Endo 11/5 stopped iron. They would like repeat blood work to see if this has decreased her levels.    Review of Systems  Constitutional: Negative for fever, chills, diaphoresis, fatigue and unexpected weight change.  Eyes: Negative for visual disturbance.  Respiratory: Negative for cough, chest tightness and wheezing.   Cardiovascular: Negative for chest pain, palpitations and leg swelling.  Gastrointestinal: Negative for nausea, vomiting, abdominal pain and diarrhea.  Musculoskeletal: Positive for myalgias, back pain and neck pain. Negative for neck  stiffness.  Skin: Negative for rash.  Neurological: Positive for dizziness and light-headedness. Negative for tremors, seizures, syncope, facial asymmetry, speech difficulty, weakness, numbness and headaches.  Psychiatric/Behavioral: Negative.    Past Medical History  Diagnosis Date  . Osteoporosis   . Hypertension     History   Social History  . Marital Status: Married    Spouse Name: N/A    Number of Children: N/A  . Years of Education: N/A   Occupational History  . Not on file.   Social History Main Topics  . Smoking status: Never Smoker   . Smokeless tobacco: Never Used  . Alcohol Use: No  . Drug Use: No  . Sexual Activity: Not on file   Other Topics Concern  . Not on file   Social History Narrative    Past Surgical History  Procedure Laterality Date  . Gastric bypass    . Breast reduction surgery  1990  . Laparoscopic hysterectomy  07/2009  . Total knee arthroplasty  2013    Dr. Marry Guan, left  . Total knee arthroplasty Right 06/2013    No family history on file.  Allergies  Allergen Reactions  . Topamax [Topiramate]     Tingling in arms    Current Outpatient Prescriptions on File Prior to Visit  Medication Sig Dispense Refill  . bisacodyl (BISACODYL) 5 MG EC tablet Take 5 mg by mouth daily as needed.     . celecoxib (CELEBREX) 100 MG capsule Take 1  capsule (100 mg total) by mouth 2 (two) times daily. 90 capsule 0  . fluocinonide ointment (LIDEX) 0.05 % Apply topically 2 (two) times daily. 30 g 0  . FLUoxetine (PROZAC) 20 MG capsule TAKE TWO CAPSULES BY MOUTH IN THE MORNING AND ONE IN THE EVENING 270 capsule 0  . HYDROcodone-acetaminophen (NORCO/VICODIN) 5-325 MG per tablet Take 1 tablet by mouth every 8 (eight) hours as needed. 60 tablet 0  . losartan (COZAAR) 50 MG tablet TAKE ONE TABLET BY MOUTH ONCE DAILY 90 tablet 0   No current facility-administered medications on file prior to visit.         Objective:   Physical Exam  Constitutional: She  is oriented to person, place, and time. She appears well-developed and well-nourished. No distress.  HENT:  Head: Normocephalic and atraumatic.  Eyes: Conjunctivae and EOM are normal. Pupils are equal, round, and reactive to light. Right eye exhibits no discharge. Left eye exhibits no discharge. No scleral icterus.  Neck: Normal range of motion. Neck supple. No thyromegaly present.  Neck not bothering her today  Cardiovascular: Normal rate, regular rhythm and intact distal pulses.   Pulmonary/Chest: Effort normal and breath sounds normal. No respiratory distress. She has no wheezes. She has no rales. She exhibits no tenderness.  Abdominal: Soft. Bowel sounds are normal. She exhibits no distension and no mass. There is no tenderness. There is no rebound and no guarding.  Musculoskeletal: Normal range of motion. She exhibits no edema or tenderness.  Crepitus of knees (past knee replacement bilateral 2013- Left 2014-Right)  Lymphadenopathy:    She has no cervical adenopathy.  Neurological: She is alert and oriented to person, place, and time. She displays normal reflexes. No cranial nerve deficit. She exhibits normal muscle tone. Coordination normal.  Assessed facial nerves in depth with no abnormalities. Other CN are grossly intact. Neg. [place]berg  Skin: Skin is warm and dry. No rash noted. She is not diaphoretic. No erythema. No pallor.  Psychiatric: She has a normal mood and affect. Her behavior is normal. Judgment and thought content normal.     BP 122/78 mmHg  Pulse 85  Temp(Src) 97.5 F (36.4 C) (Oral)  Resp 14  Ht 5' 6.5" (1.689 m)  Wt 185 lb 4 oz (84.029 kg)  BMI 29.46 kg/m2  SpO2 97%     Assessment & Plan:

## 2014-04-09 NOTE — Progress Notes (Signed)
Pre visit review using our clinic review tool, if applicable. No additional management support is needed unless otherwise documented below in the visit note. 

## 2014-04-09 NOTE — Assessment & Plan Note (Signed)
Improved- Episode on 12/1. See HPI. Suspect inner ear problems due to tinnitus/dizziness. She will make appointment with her [place] [facility] doctor to be seen. Pt was concerned about TIA's and we did not feel that at this time it was of concern. Handout with Stroke symptoms and what to do was given on AVS and reviewed with pt before leaving.

## 2014-04-10 LAB — IRON AND TIBC
%SAT: 27 % (ref 20–55)
IRON: 89 ug/dL (ref 42–145)
TIBC: 334 ug/dL (ref 250–470)
UIBC: 245 ug/dL (ref 125–400)

## 2014-04-14 ENCOUNTER — Ambulatory Visit: Payer: BC Managed Care – PPO | Admitting: Nurse Practitioner

## 2014-04-23 ENCOUNTER — Encounter: Payer: Self-pay | Admitting: Internal Medicine

## 2014-04-27 ENCOUNTER — Ambulatory Visit: Payer: Self-pay | Admitting: Otolaryngology

## 2014-05-22 ENCOUNTER — Other Ambulatory Visit: Payer: Self-pay

## 2014-05-22 DIAGNOSIS — G8929 Other chronic pain: Secondary | ICD-10-CM

## 2014-05-22 MED ORDER — HYDROCODONE-ACETAMINOPHEN 5-325 MG PO TABS: 1.0000 | ORAL_TABLET | Freq: Three times a day (TID) | ORAL | Status: DC | PRN

## 2014-05-22 NOTE — Telephone Encounter (Signed)
The pt called and is hoping to get a refill of her hydrocodone rx   Callback - [phone]

## 2014-05-22 NOTE — Telephone Encounter (Signed)
Pt notified Rx ready for pickup 

## 2014-06-18 ENCOUNTER — Other Ambulatory Visit: Payer: Self-pay | Admitting: Internal Medicine

## 2014-06-25 ENCOUNTER — Other Ambulatory Visit: Payer: Self-pay | Admitting: Internal Medicine

## 2014-07-09 ENCOUNTER — Other Ambulatory Visit: Payer: Self-pay | Admitting: *Deleted

## 2014-07-09 ENCOUNTER — Telehealth: Payer: Self-pay | Admitting: *Deleted

## 2014-07-09 DIAGNOSIS — G8929 Other chronic pain: Secondary | ICD-10-CM

## 2014-07-09 MED ORDER — HYDROCODONE-ACETAMINOPHEN 5-325 MG PO TABS: 1.0000 | ORAL_TABLET | Freq: Three times a day (TID) | ORAL | Status: DC | PRN

## 2014-07-09 NOTE — Telephone Encounter (Signed)
Fine to refill 

## 2014-07-09 NOTE — Telephone Encounter (Signed)
Pt aware Rx ready for pick up 

## 2014-07-09 NOTE — Telephone Encounter (Signed)
Pt called requesting Hydrocodone refill.  Last refill 1.15.16. Please advise refill

## 2014-08-11 ENCOUNTER — Encounter: Payer: Self-pay | Admitting: Internal Medicine

## 2014-08-11 ENCOUNTER — Ambulatory Visit (INDEPENDENT_AMBULATORY_CARE_PROVIDER_SITE_OTHER): Payer: BLUE CROSS/BLUE SHIELD | Admitting: Internal Medicine

## 2014-08-11 VITALS — BP 132/80 | HR 71 | Temp 97.9°F | Ht 66.5 in | Wt 188.5 lb

## 2014-08-11 DIAGNOSIS — Z0000 Encounter for general adult medical examination without abnormal findings: Secondary | ICD-10-CM

## 2014-08-11 DIAGNOSIS — E041 Nontoxic single thyroid nodule: Secondary | ICD-10-CM | POA: Diagnosis not present

## 2014-08-11 DIAGNOSIS — G8929 Other chronic pain: Secondary | ICD-10-CM

## 2014-08-11 DIAGNOSIS — C44119 Basal cell carcinoma of skin of left eyelid, including canthus: Secondary | ICD-10-CM

## 2014-08-11 DIAGNOSIS — E663 Overweight: Secondary | ICD-10-CM | POA: Diagnosis not present

## 2014-08-11 DIAGNOSIS — C44111 Basal cell carcinoma of skin of unspecified eyelid, including canthus: Secondary | ICD-10-CM | POA: Insufficient documentation

## 2014-08-11 LAB — COMPREHENSIVE METABOLIC PANEL
ALT: 18 U/L (ref 0–35)
AST: 24 U/L (ref 0–37)
Albumin: 4.2 g/dL (ref 3.5–5.2)
Alkaline Phosphatase: 90 U/L (ref 39–117)
BILIRUBIN TOTAL: 0.4 mg/dL (ref 0.2–1.2)
BUN: 14 mg/dL (ref 6–23)
CO2: 31 mEq/L (ref 19–32)
Calcium: 9.6 mg/dL (ref 8.4–10.5)
Chloride: 101 mEq/L (ref 96–112)
Creatinine, Ser: 0.61 mg/dL (ref 0.40–1.20)
GFR: 104.73 mL/min (ref >60.00)
Glucose, Bld: 93 mg/dL (ref 70–99)
Potassium: 4.3 mEq/L (ref 3.5–5.1)
Sodium: 136 mEq/L (ref 135–145)
Total Protein: 6.6 g/dL (ref 6.0–8.3)

## 2014-08-11 LAB — FERRITIN: Ferritin: 40.7 ng/mL (ref 10.0–291.0)

## 2014-08-11 LAB — CBC WITH DIFFERENTIAL/PLATELET
BASOS PCT: 0.8 % (ref 0.0–3.0)
Basophils Absolute: 0 10*3/uL (ref 0.0–0.1)
EOS ABS: 0.1 10*3/uL (ref 0.0–0.7)
EOS PCT: 1.1 % (ref 0.0–5.0)
HCT: 37.4 % (ref 36.0–46.0)
Hemoglobin: 12.8 g/dL (ref 12.0–15.0)
Lymphocytes Relative: 31.6 % (ref 12.0–46.0)
Lymphs Abs: 1.5 10*3/uL (ref 0.7–4.0)
MCHC: 34.2 g/dL (ref 30.0–36.0)
MCV: 88.3 fl (ref 78.0–100.0)
MONO ABS: 0.6 10*3/uL (ref 0.1–1.0)
MONOS PCT: 12.5 % — AB (ref 3.0–12.0)
NEUTROS ABS: 2.5 10*3/uL (ref 1.4–7.7)
NEUTROS PCT: 54 % (ref 43.0–77.0)
Platelets: 332 10*3/uL (ref 150.0–400.0)
RBC: 4.24 Mil/uL (ref 3.87–5.11)
RDW: 12.8 % (ref 11.5–15.5)
WBC: 4.7 10*3/uL (ref 4.0–10.5)

## 2014-08-11 LAB — LIPID PANEL
CHOLESTEROL: 186 mg/dL (ref 0–200)
HDL: 52.7 mg/dL (ref >39.00)
LDL Cholesterol: 108 mg/dL — ABNORMAL HIGH (ref 0–99)
NonHDL: 133.3
Total CHOL/HDL Ratio: 4
Triglycerides: 125 mg/dL (ref 0.0–149.0)
VLDL: 25 mg/dL (ref 0.0–40.0)

## 2014-08-11 LAB — VITAMIN D 25 HYDROXY (VIT D DEFICIENCY, FRACTURES): VITD: 19 ng/mL — ABNORMAL LOW (ref 30.00–100.00)

## 2014-08-11 LAB — T4, FREE: Free T4: 0.83 ng/dL (ref 0.60–1.60)

## 2014-08-11 LAB — MICROALBUMIN / CREATININE URINE RATIO
CREATININE, U: 77.7 mg/dL
MICROALB UR: 3.5 mg/dL — AB (ref 0.0–1.9)
MICROALB/CREAT RATIO: 4.5 mg/g (ref 0.0–30.0)

## 2014-08-11 LAB — VITAMIN B12: Vitamin B-12: >1500 pg/mL — ABNORMAL HIGH (ref 211–911)

## 2014-08-11 LAB — TSH: TSH: 0.34 u[IU]/mL — ABNORMAL LOW (ref 0.35–4.50)

## 2014-08-11 MED ORDER — BUPROPION HCL ER (XL) 150 MG PO TB24: 150.0000 mg | ORAL_TABLET | Freq: Every day | ORAL | Status: DC

## 2014-08-11 MED ORDER — HYDROCODONE-ACETAMINOPHEN 5-325 MG PO TABS: 1.0000 | ORAL_TABLET | Freq: Three times a day (TID) | ORAL | Status: DC | PRN

## 2014-08-11 MED ORDER — FLUOXETINE HCL 20 MG PO CAPS: ORAL_CAPSULE | ORAL | Status: DC

## 2014-08-11 MED ORDER — CELECOXIB 100 MG PO CAPS: 100.0000 mg | ORAL_CAPSULE | Freq: Two times a day (BID) | ORAL | Status: DC

## 2014-08-11 NOTE — Assessment & Plan Note (Signed)
Scheduled for MOHS at [facility]. Will follow.

## 2014-08-11 NOTE — Progress Notes (Signed)
Subjective:    Patient ID: Barbara Mclaughlin, female    DOB: 05-12-1950, 64 y.o.   MRN: [id number]  HPI  64YO female presents for annual exam.  She has several concerns today and notes several new medical issues.   Seen by [facility] for dizziness and ear tinnitis. Found to have acoustic neuroma in right ear. Also found to have thyroid nodule. Plans to have FNA of thyroid nodule. Holding off on resection of acoustic neuroma for now. Symptoms of dizziness have improved.  Seen by Dr. Sharlet Salina for chronic back pain. Had ESI with some improvement for about 6 weeks. Pain is now present with increased physical activity or prolonged standing. Generally aching pain. Wakes at night occasionally with pain. Sometimes eats at night when awake and feels this is contributing to weight gain. Tried taking Meloxicam for arthritis pain, but then developed GERD symptoms, so stopped. Now using Celebrex with minimal improvement. Also takes Hydrocodone on occasion for severe pain.  She is concerned about weight gain. Would like to start medication to help control appetite. Not currently following any specific diet or exercise plan. Exercise limited by back pain.  Wt Readings from Last 3 Encounters:  08/11/14 188 lb 8 oz (85.503 kg)  04/09/14 185 lb 4 oz (84.029 kg)  02/09/14 182 lb 12 oz (82.895 kg)    Past medical, surgical, family and social history per today's encounter.  Review of Systems  Constitutional: Negative for fever, chills, appetite change, fatigue and unexpected weight change.  HENT: Positive for hearing loss and tinnitus. Negative for congestion, ear pain, postnasal drip, rhinorrhea, trouble swallowing and voice change.   Eyes: Negative for visual disturbance.  Respiratory: Negative for shortness of breath.   Cardiovascular: Negative for chest pain and leg swelling.  Gastrointestinal: Negative for nausea, vomiting, abdominal pain, diarrhea and constipation.  Musculoskeletal: Positive  for myalgias and arthralgias.  Skin: Negative for color change and rash.  Neurological: Positive for dizziness. Negative for tremors, seizures, syncope, facial asymmetry, speech difficulty, weakness and headaches.  Hematological: Negative for adenopathy. Does not bruise/bleed easily.  Psychiatric/Behavioral: Negative for sleep disturbance and dysphoric mood. The patient is not nervous/anxious.        Objective:    BP 132/80 mmHg  Pulse 71  Temp(Src) 97.9 F (36.6 C) (Oral)  Ht 5' 6.5" (1.689 m)  Wt 188 lb 8 oz (85.503 kg)  BMI 29.97 kg/m2  SpO2 97% Physical Exam  Constitutional: She is oriented to person, place, and time. She appears well-developed and well-nourished. No distress.  HENT:  Head: Normocephalic and atraumatic.  Right Ear: External ear normal.  Left Ear: External ear normal.  Nose: Nose normal.  Mouth/Throat: Oropharynx is clear and moist. No oropharyngeal exudate.  Eyes: Conjunctivae are normal. Pupils are equal, round, and reactive to light. Right eye exhibits no discharge. Left eye exhibits no discharge. No scleral icterus.  Neck: Normal range of motion. Neck supple. No tracheal deviation present. Thyroid mass (palpable nodule left thyroid) present. No thyromegaly present.  Cardiovascular: Normal rate, regular rhythm, normal heart sounds and intact distal pulses.  Exam reveals no gallop and no friction rub.   No murmur heard. Pulmonary/Chest: Effort normal and breath sounds normal. No accessory muscle usage. No tachypnea. No respiratory distress. She has no decreased breath sounds. She has no wheezes. She has no rales. She exhibits no tenderness. Right breast exhibits no inverted nipple, no mass, no nipple discharge, no skin change and no tenderness. Left breast exhibits no inverted nipple,  no mass, no nipple discharge, no skin change and no tenderness. Breasts are symmetrical.  Abdominal: Soft. Bowel sounds are normal. She exhibits no distension and no mass. There is  no tenderness. There is no rebound and no guarding.  Musculoskeletal: Normal range of motion. She exhibits no edema or tenderness.  Lymphadenopathy:    She has no cervical adenopathy.  Neurological: She is alert and oriented to person, place, and time. No cranial nerve deficit. She exhibits normal muscle tone. Coordination normal.  Skin: Skin is warm and dry. No rash noted. She is not diaphoretic. No erythema. No pallor.  Psychiatric: She has a normal mood and affect. Her behavior is normal. Judgment and thought content normal.          Assessment & Plan:   Problem List Items Addressed This Visit      Unprioritized   Basal cell carcinoma of left eyelid    Scheduled for MOHS at [facility]. Will follow.      Relevant Medications   celecoxib (CELEBREX) capsule   HYDROcodone-acetaminophen (NORCO/VICODIN) 5-325 MG per tablet   Chronic pain    Secondary to OA. Unable to use NSAIDs because of GERD and h/o gastric bypass. Will continue Celebrex and prn Vicodin for severe pain only. Discussed potential risks of narcotic medications.      Relevant Medications   celecoxib (CELEBREX) capsule   FLUoxetine (PROZAC) capsule   buPROPion (WELLBUTRIN XL) 24 hr tablet   HYDROcodone-acetaminophen (NORCO/VICODIN) 5-325 MG per tablet   Overweight (BMI 25.0-29.9)    Wt Readings from Last 3 Encounters:  08/11/14 188 lb 8 oz (85.503 kg)  04/09/14 185 lb 4 oz (84.029 kg)  02/09/14 182 lb 12 oz (82.895 kg)  Body mass index is 29.97 kg/(m^2).  She is concerned about weight gain. Would like to start medication for appetite suppression. Will start Wellbutrin. Follow up in 4 weeks to recheck weight.      Relevant Medications   buPROPion (WELLBUTRIN XL) 24 hr tablet   Routine general medical examination at a health care facility - Primary    General medical exam including breast exam normal today. PAP and pelvic deferred as PAP normal, HPV neg 11/2011. Will plan repeat 2018. Mammogram UTD and reviewed.  Colonoscopy UTD. Encouraged healthy diet and exercise. Labs today including CBC, CMP, lipids, TSH, Free T4, Vit D.      Relevant Orders   CBC with Differential/Platelet   Comprehensive metabolic panel   Lipid panel   Vit D  25 hydroxy (rtn osteoporosis monitoring)   Microalbumin / creatinine urine ratio   Ferritin   B12   Thyroid nodule    Reviewed recent thyroid US showing left 3.1cm nodule. Plan for FNA with Dr. Pryor Ochoa. Will follow. Will check TSH and Free T4 with labs today.      Relevant Orders   T4, free   TSH       Return in about 4 weeks (around 09/08/2014) for Recheck.

## 2014-08-11 NOTE — Assessment & Plan Note (Addendum)
Reviewed recent thyroid US showing left 3.1cm nodule. Plan for FNA with Dr. Pryor Ochoa. Will follow. Will check TSH and Free T4 with labs today.

## 2014-08-11 NOTE — Assessment & Plan Note (Signed)
Wt Readings from Last 3 Encounters:  08/11/14 188 lb 8 oz (85.503 kg)  04/09/14 185 lb 4 oz (84.029 kg)  02/09/14 182 lb 12 oz (82.895 kg)  Body mass index is 29.97 kg/(m^2).  She is concerned about weight gain. Would like to start medication for appetite suppression. Will start Wellbutrin. Follow up in 4 weeks to recheck weight.

## 2014-08-11 NOTE — Assessment & Plan Note (Signed)
Secondary to OA. Unable to use NSAIDs because of GERD and h/o gastric bypass. Will continue Celebrex and prn Vicodin for severe pain only. Discussed potential risks of narcotic medications.

## 2014-08-11 NOTE — Patient Instructions (Addendum)
Start Wellbutrin $RemoveBeforeDE'150mg'[id number]$  daily to help control appetite.  Health Maintenance Adopting a healthy lifestyle and getting preventive care can go a long way to promote health and wellness. Talk with your health care provider about what schedule of regular examinations is right for you. This is a good chance for you to check in with your provider about disease prevention and staying healthy. In between checkups, there are plenty of things you can do on your own. Experts have done a lot of research about which lifestyle changes and preventive measures are most likely to keep you healthy. Ask your health care provider for more information. WEIGHT AND DIET  Eat a healthy diet  Be sure to include plenty of vegetables, fruits, low-fat dairy products, and lean protein.  Do not eat a lot of foods high in solid fats, added sugars, or salt.  Get regular exercise. This is one of the most important things you can do for your health.  Most adults should exercise for at least 150 minutes each week. The exercise should increase your heart rate and make you sweat (moderate-intensity exercise).  Most adults should also do strengthening exercises at least twice a week. This is in addition to the moderate-intensity exercise.  Maintain a healthy weight  Body mass index (BMI) is a measurement that can be used to identify possible weight problems. It estimates body fat based on height and weight. Your health care provider can help determine your BMI and help you achieve or maintain a healthy weight.  For females 63 years of age and older:   A BMI below 18.5 is considered underweight.  A BMI of 18.5 to 24.9 is normal.  A BMI of 25 to 29.9 is considered overweight.  A BMI of 30 and above is considered obese.  Watch levels of cholesterol and blood lipids  You should start having your blood tested for lipids and cholesterol at 65 years of age, then have this test every 5 years.  You may need to have your  cholesterol levels checked more often if:  Your lipid or cholesterol levels are high.  You are older than 65 years of age.  You are at high risk for heart disease.  CANCER SCREENING   Lung Cancer  Lung cancer screening is recommended for adults 2-82 years old who are at high risk for lung cancer because of a history of smoking.  A yearly low-dose CT scan of the lungs is recommended for people who:  Currently smoke.  Have quit within the past 15 years.  Have at least a 30-pack-year history of smoking. A pack year is smoking an average of one pack of cigarettes a day for 1 year.  Yearly screening should continue until it has been 15 years since you quit.  Yearly screening should stop if you develop a health problem that would prevent you from having lung cancer treatment.  Breast Cancer  Practice breast self-awareness. This means understanding how your breasts normally appear and feel.  It also means doing regular breast self-exams. Let your health care provider know about any changes, no matter how small.  If you are in your 20s or 30s, you should have a clinical breast exam (CBE) by a health care provider every 1-3 years as part of a regular health exam.  If you are 24 or older, have a CBE every year. Also consider having a breast X-ray (mammogram) every year.  If you have a family history of breast cancer, talk to your health  care provider about genetic screening.  If you are at high risk for breast cancer, talk to your health care provider about having an MRI and a mammogram every year.  Breast cancer gene (BRCA) assessment is recommended for women who have family members with BRCA-related cancers. BRCA-related cancers include:  Breast.  Ovarian.  Tubal.  Peritoneal cancers.  Results of the assessment will determine the need for genetic counseling and BRCA1 and BRCA2 testing. Cervical Cancer Routine pelvic examinations to screen for cervical cancer are no  longer recommended for nonpregnant women who are considered low risk for cancer of the pelvic organs (ovaries, uterus, and vagina) and who do not have symptoms. A pelvic examination may be necessary if you have symptoms including those associated with pelvic infections. Ask your health care provider if a screening pelvic exam is right for you.   The Pap test is the screening test for cervical cancer for women who are considered at risk.  If you had a hysterectomy for a problem that was not cancer or a condition that could lead to cancer, then you no longer need Pap tests.  If you are older than 65 years, and you have had normal Pap tests for the past 10 years, you no longer need to have Pap tests.  If you have had past treatment for cervical cancer or a condition that could lead to cancer, you need Pap tests and screening for cancer for at least 20 years after your treatment.  If you no longer get a Pap test, assess your risk factors if they change (such as having a new sexual partner). This can affect whether you should start being screened again.  Some women have medical problems that increase their chance of getting cervical cancer. If this is the case for you, your health care provider may recommend more frequent screening and Pap tests.  The human papillomavirus (HPV) test is another test that may be used for cervical cancer screening. The HPV test looks for the virus that can cause cell changes in the cervix. The cells collected during the Pap test can be tested for HPV.  The HPV test can be used to screen women 26 years of age and older. Getting tested for HPV can extend the interval between normal Pap tests from three to five years.  An HPV test also should be used to screen women of any age who have unclear Pap test results.  After 65 years of age, women should have HPV testing as often as Pap tests.  Colorectal Cancer  This type of cancer can be detected and often  prevented.  Routine colorectal cancer screening usually begins at 65 years of age and continues through 65 years of age.  Your health care provider may recommend screening at an earlier age if you have risk factors for colon cancer.  Your health care provider may also recommend using home test kits to check for hidden blood in the stool.  A small camera at the end of a tube can be used to examine your colon directly (sigmoidoscopy or colonoscopy). This is done to check for the earliest forms of colorectal cancer.  Routine screening usually begins at age 65.  Direct examination of the colon should be repeated every 5-10 years through 65 years of age. However, you may need to be screened more often if early forms of precancerous polyps or small growths are found. Skin Cancer  Check your skin from head to toe regularly.  Tell your health  care provider about any new moles or changes in moles, especially if there is a change in a mole's shape or color.  Also tell your health care provider if you have a mole that is larger than the size of a pencil eraser.  Always use sunscreen. Apply sunscreen liberally and repeatedly throughout the day.  Protect yourself by wearing long sleeves, pants, a wide-brimmed hat, and sunglasses whenever you are outside. HEART DISEASE, DIABETES, AND HIGH BLOOD PRESSURE   Have your blood pressure checked at least every 1-2 years. High blood pressure causes heart disease and increases the risk of stroke.  If you are between 78 years and 37 years old, ask your health care provider if you should take aspirin to prevent strokes.  Have regular diabetes screenings. This involves taking a blood sample to check your fasting blood sugar level.  If you are at a normal weight and have a low risk for diabetes, have this test once every three years after 65 years of age.  If you are overweight and have a high risk for diabetes, consider being tested at a younger age or more  often. PREVENTING INFECTION  Hepatitis B  If you have a higher risk for hepatitis B, you should be screened for this virus. You are considered at high risk for hepatitis B if:  You were born in a country where hepatitis B is common. Ask your health care provider which countries are considered high risk.  Your parents were born in a high-risk country, and you have not been immunized against hepatitis B (hepatitis B vaccine).  You have HIV or AIDS.  You use needles to inject street drugs.  You live with someone who has hepatitis B.  You have had sex with someone who has hepatitis B.  You get hemodialysis treatment.  You take certain medicines for conditions, including cancer, organ transplantation, and autoimmune conditions. Hepatitis C  Blood testing is recommended for:  Everyone born from [birth year] through 1966.  Anyone with known risk factors for hepatitis C. Sexually transmitted infections (STIs)  You should be screened for sexually transmitted infections (STIs) including gonorrhea and chlamydia if:  You are sexually active and are younger than 65 years of age.  You are older than 65 years of age and your health care provider tells you that you are at risk for this type of infection.  Your sexual activity has changed since you were last screened and you are at an increased risk for chlamydia or gonorrhea. Ask your health care provider if you are at risk.  If you do not have HIV, but are at risk, it may be recommended that you take a prescription medicine daily to prevent HIV infection. This is called pre-exposure prophylaxis (PrEP). You are considered at risk if:  You are sexually active and do not regularly use condoms or know the HIV status of your partner(s).  You take drugs by injection.  You are sexually active with a partner who has HIV. Talk with your health care provider about whether you are at high risk of being infected with HIV. If you choose to begin PrEP, you  should first be tested for HIV. You should then be tested every 3 months for as long as you are taking PrEP.  PREGNANCY   If you are premenopausal and you may become pregnant, ask your health care provider about preconception counseling.  If you may become pregnant, take 400 to 800 micrograms (mcg) of folic acid every day.  If you want to prevent pregnancy, talk to your health care provider about birth control (contraception). OSTEOPOROSIS AND MENOPAUSE   Osteoporosis is a disease in which the bones lose minerals and strength with aging. This can result in serious bone fractures. Your risk for osteoporosis can be identified using a bone density scan.  If you are 72 years of age or older, or if you are at risk for osteoporosis and fractures, ask your health care provider if you should be screened.  Ask your health care provider whether you should take a calcium or vitamin D supplement to lower your risk for osteoporosis.  Menopause may have certain physical symptoms and risks.  Hormone replacement therapy may reduce some of these symptoms and risks. Talk to your health care provider about whether hormone replacement therapy is right for you.  HOME CARE INSTRUCTIONS   Schedule regular health, dental, and eye exams.  Stay current with your immunizations.   Do not use any tobacco products including cigarettes, chewing tobacco, or electronic cigarettes.  If you are pregnant, do not drink alcohol.  If you are breastfeeding, limit how much and how often you drink alcohol.  Limit alcohol intake to no more than 1 drink per day for nonpregnant women. One drink equals 12 ounces of beer, 5 ounces of wine, or 1 ounces of hard liquor.  Do not use street drugs.  Do not share needles.  Ask your health care provider for help if you need support or information about quitting drugs.  Tell your health care provider if you often feel depressed.  Tell your health care provider if you have ever  been abused or do not feel safe at home. Document Released: 11/07/2010 Document Revised: 09/08/2013 Document Reviewed: 03/26/2013 [facility] Patient Information 2015 [place], [place]. This information is not intended to replace advice given to you by your health care provider. Make sure you discuss any questions you have with your health care provider.

## 2014-08-11 NOTE — Progress Notes (Signed)
Pre visit review using our clinic review tool, if applicable. No additional management support is needed unless otherwise documented below in the visit note. 

## 2014-08-11 NOTE — Assessment & Plan Note (Signed)
General medical exam including breast exam normal today. PAP and pelvic deferred as PAP normal, HPV neg 11/2011. Will plan repeat 2018. Mammogram UTD and reviewed. Colonoscopy UTD. Encouraged healthy diet and exercise. Labs today including CBC, CMP, lipids, TSH, Free T4, Vit D.

## 2014-08-15 ENCOUNTER — Encounter: Payer: Self-pay | Admitting: Nurse Practitioner

## 2014-08-21 ENCOUNTER — Other Ambulatory Visit: Payer: Self-pay | Admitting: Otolaryngology

## 2014-08-21 DIAGNOSIS — E041 Nontoxic single thyroid nodule: Secondary | ICD-10-CM

## 2014-08-25 NOTE — Op Note (Signed)
PATIENT NAME:  Barbara Mclaughlin, Barbara Mclaughlin MR#:  [id number] DATE OF BIRTH:  1951-03-29  DATE OF PROCEDURE:  02/05/2012  PREOPERATIVE DIAGNOSIS:  Cataract, right eye.   POSTOPERATIVE DIAGNOSIS:  Cataract, right eye.  PROCEDURE PERFORMED:  Extracapsular cataract extraction using phacoemulsification with placement of an Alcon SN6CWS, 19.5-diopter posterior chamber lens, serial # [id number].  SURGEON:  Loura Back. Neilson Oehlert, MD  ASSISTANT:  None.  ANESTHESIA:  4% lidocaine and 0.75% Marcaine in a 50/50 mixture with 10 units per mL of Hylenex added, given as a peribulbar.  ANESTHESIOLOGIST:  Dr. [place] Service   COMPLICATIONS:  None.  ESTIMATED BLOOD LOSS:  Less than 1 mL.  DESCRIPTION OF PROCEDURE:  The patient was brought to the operating room and given a peribulbar block.  The patient was then prepped and draped in the usual fashion.  The vertical rectus muscles were imbricated using 5-0 silk sutures.  These sutures were then clamped to the sterile drapes as bridle sutures.  A limbal peritomy was performed extending two clock hours and hemostasis was obtained with cautery.  A partial thickness scleral groove was made at the surgical limbus and dissected anteriorly in a lamellar dissection using an Alcon crescent knife.  The anterior chamber was entered superonasally with a Superblade and through the lamellar dissection with a 2.6 mm keratome.  DisCoVisc was used to replace the aqueous and a continuous tear capsulorrhexis was carried out.  Hydrodissection and hydrodelineation were carried out with balanced salt and a 27 gauge canula.  The nucleus was rotated to confirm the effectiveness of the hydrodissection.  Phacoemulsification was carried out using a divide-and-conquer technique.  Total ultrasound time was 1 minute and 37 seconds with an average power of 23 percent, CDE 37.48.  Irrigation/aspiration was used to remove the residual cortex.  DisCoVisc was used to inflate the capsule and the  internal incision was enlarged to 3 mm with the crescent knife.  The intraocular lens was folded and inserted into the capsular bag using the AcrySert delivery system.  Irrigation/aspiration was used to remove the residual DisCoVisc.  Miostat was injected into the anterior chamber through the paracentesis track to inflate the anterior chamber and induce miosis.  The wound was checked for leaks and none were found. The conjunctiva was closed with cautery and the bridle sutures were removed.  Two drops of 0.3% Vigamox were placed on the eye.   An eye shield was placed on the eye.  The patient was discharged to the recovery room in good condition.  ____________________________ Loura Back Lajoya Dombek, MD sad:drc D: 02/05/2012 12:34:18 ET T: 02/05/2012 12:41:21 ET JOB#: [id number]  cc: Remo Lipps A. Yatzari Jonsson, MD, <Dictator> Martie Lee MD ELECTRONICALLY SIGNED 02/12/2012 13:51

## 2014-08-29 NOTE — Discharge Summary (Signed)
PATIENT NAME:  Barbara Mclaughlin, BEGIN MR#:  [id number] DATE OF BIRTH:  1950/12/15  DATE OF ADMISSION:  06/30/2013 DATE OF DISCHARGE:  07/02/2013  ADMITTING DIAGNOSIS: Degenerative arthrosis of the right knee.   DISCHARGE DIAGNOSIS: Degenerative arthrosis of the right knee.   HISTORY: The patient is a pleasant 64 year old female, who has been followed at [facility] for progression of right knee pain. She had previously underwent a left total knee arthroplasty in February 2013 and had done extremely well. However, the right knee continued to give her a lot of discomfort. She stated that her pain was activity-related with some swelling. At the time of surgery, she was not using any ambulatory aid. She denied any gross locking of the knee or giving way of the knee. She has been unable to take anti-inflammatory medications secondary to the GI upset. She did not see any significant improvement in her condition following corticosteroid steroid injections earlier in the year. The patient states that her pain had progressed to the point that it is significantly interfering with her activities of daily living. X-rays taken in [place] showed narrowing of the medial cartilage space with bone-on-bone articulation being noted as well as being associated with varus alignment. Osteophyte and subchondral sclerosis were noted. She was noted to have degenerative changes to the patellofemoral articulation. After discussion of the risks and benefits of surgical intervention, the patient expressed her understanding of the risks and benefits and agreed for plans for surgical intervention.   PROCEDURE: Right total knee arthroplasty using computer-assisted navigation.   ANESTHESIA: Spinal.   SOFT TISSUE RELEASE: Anterior cruciate ligament, posterior cruciate ligament, deep medial collateral ligament, as well as the patellofemoral ligament.   IMPLANTS UTILIZED: DePuy PFC Sigma size 3  posterior stabilized femoral component (cemented), size 3 MBT tibial component (cemented), 32 mm 3-peg oval dome patella (cemented), and a 10 mm stabilized rotating platform polyethylene insert.   HOSPITAL COURSE: The patient tolerated the procedure very well. She had no complications. She was then taken to the PACU where she was stabilized and then transferred to the orthopedic floor. She began receiving anticoagulation therapy of Lovenox 30 mg subcutaneous every 12 hours per anesthesia and pharmacy protocol. She was fitted with TED stockings bilaterally. These were allowed to be removed 1 hour per 8 hour shift. The right one was applied on day 2 following removal of the Hemovac and dressing change. The patient was also fitted with AVI compression foot pumps bilaterally set at 80 mmHg. Her calves have been nontender. There has been no evidence of any DVTs. Negative Homans sign. Heels were elevated off the bed using rolled towels.   The patient denied chest pain or shortness of breath. Vital signs have been stable. She has been afebrile. Hemodynamically she was stable. No transfusions were given other than the Autovac transfusion given the first 6 hours postoperatively.   Physical therapy was initiated on day 1 for gait training and transfers. She has done extremely well. On day 2, the patient walked more than 200 feet. She was able go up and down 4 sets of steps. She was independent with bed to chair transfers. Occupational therapy was also initiated on day 1 for ADLs and assistive devices.   The patient's IV, Foley and Hemovac were discontinued on day 2 along with a dressing change. The wound was free of any drainage or signs of infection. Polar Care was reapplied to the surgical leg maintaining a temperature of 40 to 50 degrees Fahrenheit.  DISPOSITION: The patient is being discharged to home in improved stable condition.   DISCHARGE INSTRUCTIONS: She may continue to weight bear as tolerated.  Continue using a walker until cleared by physical therapy to go to a quad cane. She will receive home health PT. She was instructed in elevation of the lower extremity. Continue with TED stockings bilaterally. These may be removed at night, but are to be worn during the day. Also continue the Polar Care maintaining a temperature of 40 to 50 degrees Fahrenheit. Recommend for the first 2 weeks to try to wear this around-the-clock. Recommend that she continue using incentive spirometer q.1 hour while awake. Also encouraged her to continue cough and deep breathing q.2 hours while awake. She is placed on a regular diet. She was instructed in wound care. She is not to get the wound wet until the staples are removed in 2weeks. She does have a followup appointment with [facility] on March 10 at 8:45. She is to call the clinic sooner if any temperatures of 101.5 or greater or excessive bleeding.   DRUG ALLERGIES: No known drug allergies.   MEDICATIONS: She is to resume her regular medications that she was on prior to admission. She was given a prescription for oxycodone 5 to 10 mg every 4 to 6 hours p.r.n. for pain, Ultram 50 to 100 mg every 4 to 6 hours p.r.n. for pain and Lovenox 40 mg subcutaneously daily for 14 days, then discontinue and begin taking one 81 mg enteric-coated aspirin.   PAST MEDICAL HISTORY:  1.  Hypertension.  2.  Varicella.  3.  Migraines. 4.  Depression. 5.  Osteoarthritis. 6.  Obesity.   ____________________________ Vance Peper, PA jrw:aw D: 07/07/2013 16:03:09 ET T: 07/08/2013 10:07:54 ET JOB#: [id number]  cc: Vance Peper, PA, <Dictator> Shawnda Mauney PA ELECTRONICALLY SIGNED 07/12/2013 8:14

## 2014-08-29 NOTE — H&P (Signed)
PATIENT NAME:  Barbara Mclaughlin, Barbara Mclaughlin MR#:  [id number] DATE OF BIRTH:  09-Jul-1950  CHIEF COMPLAINT: Diffuse body pain and low back pain.   PROCEDURE: None.   HISTORY OF PRESENT ILLNESS: Barbara Mclaughlin is a pleasant 64 year old white female with long-standing history of low back pain and diffuse body pain. She is describing a pain that starts in her right lower back with radiation into the hip and buttocks. The pain has been present for 2 years or more. She also has some hand pain and posterior neck pain. She has also had previous  bilateral knee surgeries with knee replacements. She describes her pain as a VAS of 5 at worst, 3 at best, worse with activity and after activity, and aggravated by prolonged sitting, walking, or kneeling. Alleviating factors include hot or cold showers, chiropractic manipulation, and medication management. The pain does not provoke any weakness nor does she have associated problems with weakness or bowel or bladder dysfunction. It is described as an aching, annoying, distressing pain, primarily in her low back, which seems to be her primary problem. She has not had any other evaluations for her low back and has been seen by Dr. Precious Reel and ruled out for rheumatoid arthritis. She has been on some short-acting opioids and gotten relief with these.   PAST MEDICAL HISTORY: Significant for reflux, arthritis, hypertension, history of migraine headaches, previous right total knee surgery, hysterectomy, tummy tuck, gastric bypass, and breast reduction.  MEDICATIONS: Include folate, vitamin B12, iron, losartan, Nexium, Centrum, Prozac, DOS,   Zantac, and hydrocodone.   PHYSICAL EXAMINATION: Reveals a VAS of 3/10, temperature 96.5 with a blood pressure 144/81, pulse 68, regular respirations of 12, oxygen saturation 97%.  HEART: Regular rate and rhythm.  LUNGS: Clear to auscultation.  MUSCULOSKELETAL: Inspection of the low back reveals some right-sided paraspinous muscle  tenderness. With the patient standing and extension of the low back with right lateral rotation, this does  reproduce pain in the right lower back which radiates into the right hip. With the patient supine, she has a negative straight leg raise bilaterally. Her muscle tone is good. Her muscle strength is 5/5 both proximal and distal. She describes an area over the right lateral lower leg with some numbness.   ASSESSMENT: 1.  Chronic low back pain, right sided, facetogenic in nature. 2.  Low back syndrome.  3.  Degenerative joint disease affecting the neck and low back.   PLAN: 1.  I have had a long discussion with Barbara Mclaughlin regarding her care and I think that she would be a candidate for an epidural steroid to see if this would help with some of her facetogenic pain.  2. If she does not get relief with this, we will plan on an MRI for further evaluation of her low back pathology.  3.  I want her to continue with physical therapy exercises and we will give her some examples of these today, that would help her with core stretching and strengthening.  4.  She has asked about short-acting opioids and I think that these are reasonable if given conservatively as described to her today. We will withhold this at this time though. She is to return to the clinic in the next available day for her first epidural injection to see if we can help with the pain that she is having and she is to start physical therapy as soon as possible.    ____________________________ Alvina Filbert. Andree Elk, MD jga:LT D: 02/02/2014 16:26:33 ET T:  02/02/2014 17:05:58 ET JOB#: [id number]  cc: Alvina Filbert. Andree Elk, MD, <Dictator> Eduard Clos. Gilford Rile, MD Alvina Filbert ADAMS MD ELECTRONICALLY SIGNED 02/04/2014 17:40

## 2014-08-29 NOTE — Op Note (Signed)
PATIENT NAME:  Barbara Mclaughlin, Barbara Mclaughlin MR#:  [id number] DATE OF BIRTH:  1950-11-11  DATE OF PROCEDURE:  05/05/2013  PREOPERATIVE DIAGNOSIS:  Cataract, left eye.    POSTOPERATIVE DIAGNOSIS:  Cataract, left eye.  PROCEDURE PERFORMED:  Extracapsular cataract extraction using phacoemulsification with placement of an Alcon SN6CWS, 20.0-diopter posterior chamber lens, serial #[id number].  SURGEON:  Loura Back. Searcy Miyoshi, MD  ASSISTANT:  None.  ANESTHESIA:  4% lidocaine and 0.75% Marcaine in a 50/50 mixture with 10 units/mL of Hylenex added, given as a peribulbar.   ANESTHESIOLOGIST:  Dr. [place] Service   COMPLICATIONS:  None.  ESTIMATED BLOOD LOSS:  Less than 1 ml.  DESCRIPTION OF PROCEDURE:  The patient was brought to the operating room and given a peribulbar block.  The patient was then prepped and draped in the usual fashion.  The vertical rectus muscles were imbricated using 5-0 silk sutures.  These sutures were then clamped to the sterile drapes as bridle sutures.  A limbal peritomy was performed extending two clock hours and hemostasis was obtained with cautery.  A partial thickness scleral groove was made at the surgical limbus and dissected anteriorly in a lamellar dissection using an Alcon crescent knife.  The anterior chamber was entered supero-temporally with a Superblade and through the lamellar dissection with a 2.6 mm keratome.  DisCoVisc was used to replace the aqueous and a continuous tear capsulorrhexis was carried out.  Hydrodissection and hydrodelineation were carried out with balanced salt and a 27 gauge canula.  The nucleus was rotated to confirm the effectiveness of the hydrodissection.  Phacoemulsification was carried out using a divide-and-conquer technique.  Total ultrasound time was 1 minute and 23.9 seconds with an average power of 26.6 percent and CDE of 35.34.  Irrigation/aspiration was used to remove the residual cortex.  DisCoVisc was used to inflate the capsule and  the internal incision was enlarged to 3 mm with the crescent knife.  The intraocular lens was folded and inserted into the capsular bag using the AcrySert delivery system.  Irrigation/aspiration was used to remove the residual DisCoVisc.  Miostat was injected into the anterior chamber through the paracentesis track to inflate the anterior chamber and induce miosis. A tenth of a milliliter of cefuroxime containing 1 mg of drug was injected via the paracentesis tract. The wound was checked for leaks and none were found. The conjunctiva was closed with cautery and the bridle sutures were removed.  Two drops of 0.3% Vigamox were placed on the eye.  An eye shield was placed on the eye.  The patient was discharged to the recovery room in good condition.  ____________________________ Loura Back Briyonna Omara, MD sad:sb D: 05/05/2013 13:38:41 ET T: 05/05/2013 14:49:24 ET JOB#: [id number]  cc: Remo Lipps A. Ruey Storer, MD, <Dictator> Martie Lee MD ELECTRONICALLY SIGNED 05/12/2013 11:54

## 2014-08-29 NOTE — Op Note (Signed)
PATIENT NAME:  Barbara Mclaughlin, Barbara Mclaughlin MR#:  [id number] DATE OF BIRTH:  Jan 17, 1951  DATE OF PROCEDURE:  06/30/2013  PREOPERATIVE DIAGNOSIS: Degenerative arthrosis of the right knee.   POSTOPERATIVE DIAGNOSIS: Degenerative arthrosis of the right knee.   PROCEDURE PERFORMED: Right total knee arthroplasty using computer-assisted navigation.   SURGEON: Dr. Skip Estimable.   ASSISTANT: Vance Peper, PA (required to maintain retraction throughout the procedure).   ANESTHESIA: Spinal.   ESTIMATED BLOOD LOSS: 300 mL.   FLUIDS REPLACED: 1100 mL of crystalloid.   TOURNIQUET TIME: 69 minutes.   DRAINS: Two medium drains to reinfusion system.   SOFT TISSUE RELEASES: Anterior cruciate ligament, posterior cruciate ligament, deep medial collateral ligament and patellofemoral ligament.   IMPLANTS UTILIZED: DePuy PFC Sigma, size 3 posterior stabilized femoral component (cemented), size 3 MBT tibial component (cemented), 32 mm three-peg oval dome patella (cemented) and a 10 mm stabilized rotating platform polyethylene insert.   INDICATIONS FOR SURGERY: The patient is a 64 year old female who has been seen for complaints of progressive right knee pain. X-rays demonstrated severe degenerative changes in tricompartmental fashion. After discussion of the risks and benefits of the surgical intervention, the patient expressed understanding of the risks, benefits, and agreed with plans for surgical intervention.   PROCEDURE IN DETAIL: The patient was brought into the operating room and, after adequate spinal anesthesia was achieved, a tourniquet was placed on the patient's upper right thigh. The patient's right knee and leg were cleaned and prepped with alcohol and DuraPrep, draped in the usual sterile fashion. A "timeout" was performed as per usual protocol. The right lower extremity was exsanguinated using an Esmarch, and the tourniquet was inflated to 300 mmHg. An anterior longitudinal incision was made followed by  a standard mid vastus approach. A large effusion was evacuated. The deep fibers of the medial collateral ligament were elevated in a subperiosteal fashion off the medial flare of the tibia so as to maintain a continuous soft tissue sleeve. The patella was subluxed laterally and the patellofemoral ligament was incised. Inspection of the knee demonstrated severe degenerative changes in tricompartmental fashion. Prominent osteophytes were debrided using a rongeur. Anterior and posterior cruciate ligaments were excised. Two 4.0 mm Schanz pins were inserted into the femur and into the tibia for attachment of the ray of trackers used for computer-assisted navigation. Hip center was identified using circumduction technique. Distal landmarks were mapped using the computer. The distal femur and proximal tibia were mapped using the computer. Distal femoral cutting guide was positioned using computer-assisted navigation so as to achieve a 5 degree distal valgus cut. Cut was performed and verified using the computer. Distal femur was sized, and it was felt that a size 3 femoral component was appropriate. A size 3 cutting guide was positioned, and the anterior cut was performed and verified using the computer. This was followed by completion of the posterior and chamfer cuts. Femoral cutting guide for central box was then positioned, and a central box cut was performed. Attention was then directed to the proximal tibia. Medial and lateral menisci were excised. The extramedullary tibial cutting guide was positioned using computer-assisted navigation so as to achieve a 0 degree varus/valgus alignment and 0 degree posterior slope. Cut was performed and verified using the computer. Proximal tibia was sized and it was felt that a size 3 tibial tray was appropriate. Tibial and femoral trials were inserted, followed by insertion of a 10 mm polyethylene trial. Excellent mediolateral soft tissue balancing was appreciated, both in full  extension  and in flexion. Finally, the patella was cut and prepared so as to accommodate a 32 mm three-peg oval dome patella. Patellar trial was placed and the knee was placed through a range of motion with excellent patellar tracking appreciated. The femoral trial was removed after debridement of posterior osteophytes. Central post hole for the tibial component was reamed followed by insertion of a keel punch. Tibial trials were then removed. The cut surfaces of bone were irrigated with copious amounts of normal saline with antibiotic solution using pulsatile lavage and then suctioned dry. Polymethylmethacrylate cement was prepared in the usual fashion using a vacuum mixer. Cement was applied to the cut surface of the tibia, as well as along the undersurface of a size 3 MBT tibial component. Tibial component was positioned and impacted into place. Excess cement was removed using [employer] fast streamer. Cement was then applied to the cut surface of the femur, as well as along the posterior flanges of a size 3 posterior stabilized femoral component. Femoral component was positioned and impacted into place. Excess cement was removed using [employer] fast streamer. A 10 mm polyethylene trial was inserted and the knee was brought into full extension with steady axial compression applied. Finally, cement was applied to the backside of a 32 mm three-peg oval dome patella, and the patellar component was positioned and patellar clamp applied. Excess cement was removed using [employer] fast streamer.   After adequate curing of the cement, the tourniquet was deflated after a total tourniquet time of 69 minutes. Hemostasis was achieved using electrocautery. The knee was irrigated with copious amounts of normal saline with antibiotic solution using pulsatile lavage and then suctioned dry. The knee was inspected for any residual cement debris; 20 mL of 1.3% Exparel and 40 mL of normal saline was injected along the posterior capsule, medial and lateral  gutters, and along the arthrotomy site. A 10 mm stabilized rotating platform polyethylene insert was inserted and the knee was placed through a range of motion with excellent mediolateral soft tissue balancing, both in extension and in flexion, as well as excellent patellar tracking was noted. Two medium drains were placed in the wound bed and brought out through separate stab incisions to be attached to a reinfusion system. The medial parapatellar portion of the incisions were reapproximated using interrupted sutures of #1 Vicryl. The subcutaneous tissue was approximated in layers using first #0 Vicryl, followed by 2-0 Vicryl. Then, 30 mL of 0.25% Marcaine with epinephrine was injected into the subcutaneous tissue along the incision line. Skin was then closed with skin staples. A sterile dressing was applied.   The patient tolerated the procedure well. She was transported to the recovery room in stable condition.     ____________________________ Laurice Record. Holley Bouche., MD jph:dmm D: 06/30/2013 10:33:16 ET T: 06/30/2013 12:00:02 ET JOB#: [id number]  cc: Jeneen Rinks P. Holley Bouche., MD, <Dictator> JAMES P Holley Bouche MD ELECTRONICALLY SIGNED 07/06/2013 8:19

## 2014-08-30 NOTE — Discharge Summary (Signed)
PATIENT NAME:  Barbara Mclaughlin, Barbara Mclaughlin MR#:  [id number] DATE OF BIRTH:  03/21/1951  DATE OF ADMISSION:  07/05/2011 DATE OF DISCHARGE:  07/08/2011  ADMITTING DIAGNOSIS: Degenerative arthrosis of left knee.   DISCHARGE DIAGNOSIS: Degenerative arthrosis of left knee.    HISTORY: The patient is a pleasant 64 year old who has been followed at [facility] for a long history of progressive left knee pain. The patient had localized most of the pain along the medial aspect of the knee. She reports swelling of the knee as well as some near giving way. She has denied any locking of the knee. Her pain was noted to be aggravated with walking as well as stair ambulation. She was not using any type of ambulatory aid at the time of surgery. She had not seen any significant improvement in her condition despite multiple cortisone injections as well as Synvisc series. She is having a difficult time tolerating NSAIDs secondary to GI upset. The pain had progressed to the point that it was significantly interfering with her activities of daily living. X-rays taken in the clinic showed narrowing of the medial cartilage space with associated varus alignment. Osteophyte as well as subchondral sclerosis was noted. Significant patellofemoral degenerative changes were noted. After discussion of the risks and benefits of surgical intervention, the patient expressed her understanding of the risks and benefits and agreed for plans for surgical intervention.   PROCEDURE: Left total knee arthroplasty using computer-assisted navigation.   ANESTHESIA: Femoral nerve block with spinal.   IMPLANTS UTILIZED: DePuy PFC Sigma size 3 posterior stabilized femoral component (cemented), size 3 MBT tibial component (cemented), 35 mm three pegged oval dome patella (cemented), and a 10 and a 15 mm stabilized rotating platform polyethylene insert.   HOSPITAL COURSE: The patient tolerated the procedure very well. She had no complications. She was  then taken to PAC-U where she was stabilized and then transferred to the orthopedic floor. She began receiving anticoagulation therapy of Lovenox 30 mg sub-Q q.12 hours per anesthesia protocol. She was fitted with TED stockings bilaterally. These were allowed to be removed one hour per eight hour shift. The right one was applied on day two following removal of the Hemovac and dressing change. She was also fitted with the AV-I compression foot pumps bilaterally set at 80 mmHg. Calves have been nontender and free of any evidence of any deep venous thromboses. Negative Homans sign. Heels were elevated off the bed using rolled towels. Heels had no tissue breakdown. No tenderness noted to palpation.    Physical therapy was initiated on day one for activities of daily living and assisted devices. She has progressed very nicely. On day one she was ambulating 200 feet. Upon being discharged she was ambulating 200 feet twice a day. She was able to go up and down four sets of steps. She was independent with bed-to-chair transfers. Occupational therapy was also initiated on day one for activities of daily living and assistive devices.   The patient's vital signs have been stable. She has been afebrile. Hemodynamically she was stable. No transfusions were given, however, her hemoglobin did drop to 7.9 but a follow-up hemoglobin later that day was 8.2 with the vital signs being unchanged and she was asymptomatic. She did receive Autovac transfusions the first six hours postoperatively.   The patient's IV, Foley, and Hemovac were all discontinued on day two along with a dressing change. Polar Care was reapplied to the surgical leg maintaining a temperature of 40 to 50 degrees Fahrenheit.  DISPOSITION: The patient was discharged to home in improved stable condition.   DISCHARGE INSTRUCTIONS:  1. She is to continue weightbearing as tolerated. Continue using a walker until cleared by physical therapy to go to a quad  cane.  2. She will receive home health PT.  3. She is to continue with TED stockings. These are to be worn during the day but may remove these at night.  4. She is to continue using the Polar Care maintaining a temperature of 40 to 50 degrees Fahrenheit. Recommend that she wear this as much as possible until she is seen in the office in two weeks.  5. She is placed on a regular diet.  6. She is to resume her regular medication that she was on prior to admission.  7. She was given a prescription for oxycodone 5 to 10 mg q.4 to 6 hours p.r.n. for pain and Ultram 50 to 100 mg q.4 to 6 hours p.r.n. for pain. Third prescription of Lovenox 40 mg sub-Q daily for 14 days was given. After 14 days she is to discontinue and begin taking one 81 mg enteric-coated aspirin per day.  PAST MEDICAL HISTORY:  1. Hypertension.  2. Chickenpox. 3. Migraines.  4. Depression.  ____________________________ Vance Peper, PA jrw:[id number] D: 07/10/2011 11:18:05 ET T: 07/10/2011 11:54:09 ET JOB#: [id number]  cc: Vance Peper, PA, <Dictator> Ravindra Baranek PA ELECTRONICALLY SIGNED 07/11/2011 8:02

## 2014-08-30 NOTE — Op Note (Signed)
PATIENT NAME:  Barbara Mclaughlin, [name] MR#:  [id number] DATE OF BIRTH:  [birth year]  DATE OF PROCEDURE:  07/05/2011  PREOPERATIVE DIAGNOSIS: Degenerative arthrosis of the left knee.   POSTOPERATIVE DIAGNOSIS: Degenerative arthrosis of the left knee.   PROCEDURE PERFORMED: Left total knee arthroplasty using computer-assisted navigation.   SURGEON: Laurice Record. Holley Bouche., MD    ASSISTANT: Vance Peper, PA-C (required to maintain retraction throughout the procedure)   ANESTHESIA: Femoral nerve block and spinal.   ESTIMATED BLOOD LOSS: 400 mL.   FLUIDS REPLACED: 2000 mL of Crystalloid.   TOURNIQUET TIME: 82 minutes.   DRAINS: Two medium drains to reinfusion system.   SOFT TISSUE RELEASES: Anterior cruciate ligament, posterior cruciate ligament, deep medial collateral ligament, patellofemoral ligament.   IMPLANTS UTILIZED: DePuy PFC Sigma size 3 posterior stabilized femoral component (cemented), size 3 MBT tibial component (cemented), 35 mm three peg oval dome patella (cemented), and 15 mm stabilized rotating platform polyethylene insert.   INDICATIONS FOR SURGERY: The patient is a 64 year old female who has been seen for complaints of progressive left knee pain. X-rays demonstrated severe degenerative changes in a tricompartmental fashion. After discussion of the risks and benefits of surgical intervention, the patient expressed her understanding of the risks and benefits and agreed with plans for surgical intervention.   PROCEDURE IN DETAIL: The patient was brought into the operating room and, after adequate femoral nerve block and spinal anesthesia was achieved, a tourniquet was placed on the patient's upper left thigh. The patient's left knee and leg were cleaned and prepped with alcohol and DuraPrep and draped in the usual sterile fashion. A "time-out" was performed as per usual protocol. The left lower extremity was exsanguinated using Esmarch, and the tourniquet was inflated to 300 mmHg.  Anterior longitudinal incision was made followed by a standard mid vastus approach. A large effusion was evacuated. Deep fibers of the medial collateral ligament were elevated off the medial flare of the tibia so as to maintain a continuous soft tissue sleeve. The patella was subluxed laterally and the patellofemoral ligament was incised. Inspection of the knee demonstrated severe degenerative changes with full thickness loss of articular cartilage and prominent osteophytes in tricompartmental fashion. Osteophytes were debrided using a rongeur. Anterior and posterior cruciate ligaments were excised. Two 4.0 mm Schanz pins were inserted into the femur and into the tibia for attachment of the array of spheres used for computer-assisted navigation. Hip center was identified using circumduction technique. Distal landmarks were mapped using the computer. Distal femur and proximal tibia were mapped using the computer. Distal femoral cutting guide was positioned using computer-assisted navigation so as to achieve a 5 degree distal valgus cut. Cut was performed and verified using the computer. Distal femur was then sized and it was felt that a size 3 femoral component was appropriate. Size 3 cutting guide was positioned using computer-assisted navigation and the anterior cut was performed and verified using the computer. This was followed by completion of the posterior and chamfer cuts. Femoral cutting guide for the central box was then positioned and central box cut was performed.   Attention was then directed to the proximal tibia. Medial and lateral menisci were excised. The extramedullary tibial cutting guide was positioned using computer-assisted navigation so as to achieve 0 degree varus valgus alignment and 0 degree posterior slope. Cut was performed and verified using the computer. Proximal tibia was sized and it was felt that a size 3 tibial tray was appropriate. Tibial and femoral trials were inserted followed  by insertion of first a 10 and subsequently a 12.5 and eventually a 15 mm polyethylene trial. This allowed for excellent mediolateral soft tissue balancing both in full extension and in 90 degrees of flexion. Finally, patella was cut and prepared so as to accommodate a 35 mm three peg oval dome patella. Patellar trial was placed and the knee was placed through a range of motion with excellent patellar tracking appreciated.   Femoral trial was removed. Central post hole for the tibial component was reamed followed by insertion of a keel punch. Tibial trials were then removed. Cut surfaces of bone were irrigated with copious amounts of normal saline with antibiotic solution using pulsatile lavage and then suctioned dry. Polymethyl methacrylate cement was prepared in the usual fashion using a vacuum mixer. Cement was applied to the cut surface of the proximal tibia as well as along the undersurface of a size 3 MBT tibial component. Tibial component was positioned and impacted into place. Excess cement was removed using freer elevators. Cement was then applied to the cut surface of the femur as well as along the posterior flanges of size 3 posterior stabilized femoral component. Femoral component was positioned and impacted into place. Excess cement was removed using freer elevators. A 15 mm polyethylene trial was inserted and the knee was brought into full extension with steady axial compression applied. Finally, cement was applied to the backside of a 35 mm three peg oval dome patella and the patellar component was positioned and patellar clamp applied. Excess cement was removed using freer elevators.   After adequate curing of cement, the tourniquet was deflated after a total tourniquet time of 82 minutes. Hemostasis was eventually achieved using electrocautery. Knee was irrigated with copious amounts of normal saline with antibiotic solution using pulsatile lavage and then suctioned dry. 30 mL of 0.25% Marcaine  with epinephrine was injected along the posterior capsule. A 15 mm stabilized rotating platform polyethylene insert was inserted and the knee was placed through a range of motion. Excellent patellar tracking was appreciated. Excellent mediolateral soft tissue balancing was appreciated both in full extension and in 90 degrees of flexion.   Two medium drains were placed in the wound bed and brought out through a separate stab incision to be attached to a reinfusion system. The medial parapatellar portion of the incision was reapproximated using interrupted sutures of #1 Vicryl. Subcutaneous tissue was approximated in layers using first #0 Vicryl followed by #2-0 Vicryl. Skin was closed with skin staples. Sterile dressing was applied.   The patient tolerated the procedure well. She was transported to the recovery room in stable condition.    ____________________________ Laurice Record. Holley Bouche., MD jph:drc D: 07/05/2011 17:37:15 ET T: 07/06/2011 07:47:07 ET JOB#: [id number]  cc: Jeneen Rinks P. Holley Bouche., MD, <Dictator> Laurice Record Holley Bouche MD ELECTRONICALLY SIGNED 07/07/2011 11:37

## 2014-08-31 LAB — SURGICAL PATHOLOGY

## 2014-09-02 ENCOUNTER — Other Ambulatory Visit: Payer: Self-pay | Admitting: Otolaryngology

## 2014-09-02 DIAGNOSIS — E041 Nontoxic single thyroid nodule: Secondary | ICD-10-CM

## 2014-09-09 ENCOUNTER — Ambulatory Visit: Payer: BLUE CROSS/BLUE SHIELD

## 2014-09-16 ENCOUNTER — Ambulatory Visit (INDEPENDENT_AMBULATORY_CARE_PROVIDER_SITE_OTHER): Payer: BLUE CROSS/BLUE SHIELD | Admitting: Internal Medicine

## 2014-09-16 ENCOUNTER — Encounter: Payer: Self-pay | Admitting: Internal Medicine

## 2014-09-16 VITALS — BP 118/74 | HR 68 | Temp 98.1°F | Ht 66.5 in | Wt 183.5 lb

## 2014-09-16 DIAGNOSIS — E663 Overweight: Secondary | ICD-10-CM

## 2014-09-16 DIAGNOSIS — C44119 Basal cell carcinoma of skin of left eyelid, including canthus: Secondary | ICD-10-CM

## 2014-09-16 DIAGNOSIS — G8929 Other chronic pain: Secondary | ICD-10-CM

## 2014-09-16 DIAGNOSIS — C44111 Basal cell carcinoma of skin of unspecified eyelid, including canthus: Secondary | ICD-10-CM

## 2014-09-16 MED ORDER — HYDROCODONE-ACETAMINOPHEN 5-325 MG PO TABS: 1.0000 | ORAL_TABLET | Freq: Three times a day (TID) | ORAL | Status: DC | PRN

## 2014-09-16 NOTE — Progress Notes (Signed)
Pre visit review using our clinic review tool, if applicable. No additional management support is needed unless otherwise documented below in the visit note. 

## 2014-09-16 NOTE — Patient Instructions (Signed)
Follow up in 3 months

## 2014-09-16 NOTE — Progress Notes (Signed)
   Subjective:    Patient ID: Barbara Mclaughlin, female    DOB: 1950-12-16, 64 y.o.   MRN: [id number]  HPI  64YO female presents for followup.  Started on Wellbutrin last visit to help with appetite. Feeling well on medication.  Also started back on Weight Watchers.  Wt Readings from Last 3 Encounters:  09/16/14 183 lb 8 oz (83.235 kg)  08/11/14 188 lb 8 oz (85.503 kg)  04/09/14 185 lb 4 oz (84.029 kg)   Had MOHS surgery yesterday on left eyelid lesion.  Past medical, surgical, family and social history per today's encounter.  Review of Systems  Constitutional: Negative for fever, chills, appetite change, fatigue and unexpected weight change.  Eyes: Negative for visual disturbance.  Respiratory: Negative for shortness of breath.   Cardiovascular: Negative for chest pain, palpitations and leg swelling.  Gastrointestinal: Negative for abdominal pain.  Skin: Positive for color change and wound. Negative for rash.  Hematological: Negative for adenopathy. Does not bruise/bleed easily.  Psychiatric/Behavioral: Negative for sleep disturbance and dysphoric mood. The patient is not nervous/anxious.        Objective:    BP 118/74 mmHg  Pulse 68  Temp(Src) 98.1 F (36.7 C) (Oral)  Ht 5' 6.5" (1.689 m)  Wt 183 lb 8 oz (83.235 kg)  BMI 29.18 kg/m2  SpO2 95% Physical Exam  Constitutional: She is oriented to person, place, and time. She appears well-developed and well-nourished. No distress.  HENT:  Head: Normocephalic and atraumatic.    Right Ear: External ear normal.  Left Ear: External ear normal.  Nose: Nose normal.  Mouth/Throat: Oropharynx is clear and moist. No oropharyngeal exudate.  Eyes: Conjunctivae are normal. Pupils are equal, round, and reactive to light. Right eye exhibits no discharge. Left eye exhibits no discharge. No scleral icterus.  Neck: Normal range of motion. Neck supple. No tracheal deviation present. No thyromegaly present.  Cardiovascular: Normal  rate, regular rhythm, normal heart sounds and intact distal pulses.  Exam reveals no gallop and no friction rub.   No murmur heard. Pulmonary/Chest: Effort normal and breath sounds normal. No respiratory distress. She has no wheezes. She has no rales. She exhibits no tenderness.  Musculoskeletal: Normal range of motion. She exhibits no edema or tenderness.  Lymphadenopathy:    She has no cervical adenopathy.  Neurological: She is alert and oriented to person, place, and time. No cranial nerve deficit. She exhibits normal muscle tone. Coordination normal.  Skin: Skin is warm and dry. No rash noted. She is not diaphoretic. No erythema. No pallor.  Psychiatric: She has a normal mood and affect. Her behavior is normal. Judgment and thought content normal.          Assessment & Plan:   Problem List Items Addressed This Visit      Unprioritized   Basal cell carcinoma of left eyelid   Relevant Medications   HYDROcodone-acetaminophen (NORCO/VICODIN) 5-325 MG per tablet   Chronic pain   Relevant Medications   HYDROcodone-acetaminophen (NORCO/VICODIN) 5-325 MG per tablet   Overweight (BMI 25.0-29.9) - Primary       Return in about 3 months (around 12/17/2014) for Recheck.

## 2014-09-18 ENCOUNTER — Encounter: Payer: Self-pay | Admitting: Internal Medicine

## 2014-09-28 ENCOUNTER — Other Ambulatory Visit: Payer: Self-pay | Admitting: Otolaryngology

## 2014-10-03 ENCOUNTER — Emergency Department: Payer: BLUE CROSS/BLUE SHIELD

## 2014-10-03 ENCOUNTER — Other Ambulatory Visit: Payer: Self-pay

## 2014-10-03 ENCOUNTER — Encounter: Payer: Self-pay | Admitting: Emergency Medicine

## 2014-10-03 ENCOUNTER — Emergency Department
Admission: EM | Admit: 2014-10-03 | Discharge: 2014-10-03 | Disposition: A | Discharge status: Home / Self Care | Payer: BLUE CROSS/BLUE SHIELD | Attending: Student | Admitting: Student

## 2014-10-03 DIAGNOSIS — I471 Supraventricular tachycardia: Secondary | ICD-10-CM | POA: Diagnosis not present

## 2014-10-03 DIAGNOSIS — Z79899 Other long term (current) drug therapy: Secondary | ICD-10-CM | POA: Insufficient documentation

## 2014-10-03 DIAGNOSIS — I10 Essential (primary) hypertension: Secondary | ICD-10-CM | POA: Insufficient documentation

## 2014-10-03 DIAGNOSIS — R000 Tachycardia, unspecified: Secondary | ICD-10-CM | POA: Diagnosis present

## 2014-10-03 DIAGNOSIS — R002 Palpitations: Secondary | ICD-10-CM

## 2014-10-03 LAB — BASIC METABOLIC PANEL
ANION GAP: 7 (ref 5–15)
BUN: 13 mg/dL (ref 6–20)
CO2: 28 mmol/L (ref 22–32)
Calcium: 9.2 mg/dL (ref 8.9–10.3)
Chloride: 101 mmol/L (ref 101–111)
Creatinine, Ser: 0.7 mg/dL (ref 0.44–1.00)
GFR calc non Af Amer: >60 mL/min (ref >60)
Glucose, Bld: 112 mg/dL — ABNORMAL HIGH (ref 65–99)
Potassium: 4.2 mmol/L (ref 3.5–5.1)
Sodium: 136 mmol/L (ref 135–145)

## 2014-10-03 LAB — CBC
HCT: 37.7 % (ref 35.0–47.0)
Hemoglobin: 12.7 g/dL (ref 12.0–16.0)
MCH: 30.3 pg (ref 26.0–34.0)
MCHC: 33.6 g/dL (ref 32.0–36.0)
MCV: 90.3 fL (ref 80.0–100.0)
Platelets: 293 10*3/uL (ref 150–440)
RBC: 4.18 MIL/uL (ref 3.80–5.20)
RDW: 12.8 % (ref 11.5–14.5)
WBC: 5.5 10*3/uL (ref 3.6–11.0)

## 2014-10-03 LAB — TSH: TSH: 0.11 u[IU]/mL — ABNORMAL LOW (ref 0.350–4.500)

## 2014-10-03 LAB — TROPONIN I: Troponin I: <0.03 ng/mL (ref <0.031)

## 2014-10-03 LAB — T4, FREE: FREE T4: 1.01 ng/dL (ref 0.61–1.12)

## 2014-10-03 MED ORDER — ADENOSINE 12 MG/4ML IV SOLN
INTRAVENOUS | Status: AC
  Administered 2014-10-03: 12 mg
  Filled 2014-10-03: qty 4

## 2014-10-03 MED ORDER — ADENOSINE 6 MG/2ML IV SOLN
INTRAVENOUS | Status: AC
  Filled 2014-10-03: qty 2

## 2014-10-03 MED ORDER — SODIUM CHLORIDE 0.9 % IV BOLUS (SEPSIS)
1000.0000 mL | Freq: Once | INTRAVENOUS | Status: AC
  Administered 2014-10-03: 1000 mL via INTRAVENOUS

## 2014-10-03 NOTE — ED Notes (Signed)
Patient to ED with c.o tachycardia. Patient states she was putting on her make up when the sensation started. Patient states " I felt really dizzy, I knew something was not right". Patient is alert and oriented. Patient denies any previous episodes like this in the past.

## 2014-10-03 NOTE — ED Provider Notes (Signed)
[facility] Emergency Department Provider Note  ____________________________________________  Time seen: Approximately 9:24 AM  I have reviewed the triage vital signs and the nursing notes.   HISTORY  Chief Complaint Tachycardia    HPI Barbara Mclaughlin is a 64 y.o. female with history of hypertension and depression who presents with sudden onset palpitations associated with lightheadedness that began just prior to arrival. The patient reports that she was putting on her makeup this morning when she felt suddenly felt her heart rate increase. She felt lightheaded. she has had no chest pain or difficulty breathing. No recent illness including no cough, sneezing, runny nose, congestion, vomiting, diarrhea. Current severity is 10 out of 10. No modifying factors.   Past Medical History  Diagnosis Date  . Osteoporosis   . Hypertension   . Acoustic neuroma     right ear  . Thyroid nodule   . Basal cell carcinoma, eyelid     Patient Active Problem List   Diagnosis Date Noted  . Thyroid nodule 08/11/2014  . Basal cell carcinoma of left eyelid 08/11/2014  . Light headedness 04/09/2014  . Anemia, iron deficiency 04/09/2014  . Dysphagia, pharyngoesophageal phase 04/29/2013  . Routine general medical examination at a health care facility 01/13/2013  . Nocturnal muscle cramps 09/06/2012  . Chronic pain 11/20/2011  . Screening for breast cancer 11/20/2011  . Arthritis 04/17/2011  . Hypertension 04/17/2011  . Depression 04/17/2011  . GERD (gastroesophageal reflux disease) 04/17/2011  . Menopausal and perimenopausal disorder 04/17/2011  . Overweight (BMI 25.0-29.9) 04/17/2011  . Pre-operative cardiovascular examination 09/19/2010  . Abnormal EKG 09/19/2010    Past Surgical History  Procedure Laterality Date  . Gastric bypass    . Breast reduction surgery  1990  . Laparoscopic hysterectomy  07/2009  . Total knee arthroplasty  2013    Dr. Marry Guan, left   . Total knee arthroplasty Right 06/2013    Current Outpatient Rx  Name  Route  Sig  Dispense  Refill  . bacitracin ophthalmic ointment      Use small amount on sutures 4 times a day for 12-14 days.  Discontinue if allergy symptoms develop and call our office.         . bisacodyl (BISACODYL) 5 MG EC tablet   Oral   Take 5 mg by mouth daily as needed.          Marland Kitchen buPROPion (WELLBUTRIN XL) 150 MG 24 hr tablet   Oral   Take 1 tablet (150 mg total) by mouth daily.   30 tablet   3   . celecoxib (CELEBREX) 100 MG capsule   Oral   Take 1 capsule (100 mg total) by mouth 2 (two) times daily.   180 capsule   3   . Cholecalciferol (VITAMIN D3) 2000 UNITS capsule   Oral   Take 2,000 Units by mouth daily.         Marland Kitchen esomeprazole (NEXIUM) 40 MG capsule   Oral   Take 80 mg by mouth every morning.         . fluocinonide ointment (LIDEX) 0.05 %   Topical   Apply topically 2 (two) times daily.   30 g   0   . FLUoxetine (PROZAC) 20 MG capsule      TAKE TWO CAPSULES BY MOUTH IN THE MORNING AND ONE IN THE EVENING   270 capsule   1   . FOLIC ACID PO   Oral   Take 1 tablet by  mouth daily.         Marland Kitchen HYDROcodone-acetaminophen (NORCO/VICODIN) 5-325 MG per tablet   Oral   Take 1 tablet by mouth every 8 (eight) hours as needed. Patient taking differently: Take 1 tablet by mouth every 8 (eight) hours as needed for moderate pain or severe pain.    60 tablet   0   . losartan (COZAAR) 50 MG tablet      TAKE ONE TABLET BY MOUTH ONCE DAILY   90 tablet   0   . vitamin B-12 (CYANOCOBALAMIN) 1000 MCG tablet   Oral   Take 1,000 mcg by mouth daily.           Allergies Topamax  History reviewed. No pertinent family history.  Social History History  Substance Use Topics  . Smoking status: Never Smoker   . Smokeless tobacco: Never Used  . Alcohol Use: No    Review of Systems Constitutional: No fever/chills Eyes: No visual changes. ENT: No sore  throat. Cardiovascular: Denies chest pain. Respiratory: Denies shortness of breath. Gastrointestinal: No abdominal pain.  No nausea, no vomiting.  No diarrhea.  No constipation. Genitourinary: Negative for dysuria. Musculoskeletal: Negative for back pain. Skin: Negative for rash. Neurological: Negative for headaches, focal weakness or numbness.  10-point ROS otherwise negative.  ____________________________________________   PHYSICAL EXAM:  VITAL SIGNS: ED Triage Vitals  Enc Vitals Group     BP 10/03/14 0840 98/79 mmHg     Pulse Rate 10/03/14 0840 172     Resp 10/03/14 0840 21     Temp 10/03/14 0840 98 F (36.7 C)     Temp Source 10/03/14 0840 Oral     SpO2 10/03/14 0840 97 %     Weight 10/03/14 0840 170 lb (77.111 kg)     Height 10/03/14 0840 5\' 7"  (1.702 m)     Head Cir --      Peak Flow --      Pain Score --      Pain Loc --      Pain Edu? --      Excl. in [place]? --     Constitutional: Alert and oriented. Nontoxic appearing and in no acute distress. Eyes: Conjunctivae are normal. PERRL. EOMI. Head: Atraumatic. Nose: No congestion/rhinnorhea. Mouth/Throat: Mucous membranes are moist.  Oropharynx non-erythematous. Neck: No stridor. Cardiovascular: tachycardic rate, regular rhythm. Grossly normal heart sounds.  Good peripheral circulation. Respiratory: Normal respiratory effort.  No retractions. Lungs CTAB. Gastrointestinal: Soft and nontender. No distention. No abdominal bruits. No CVA tenderness. Genitourinary: deferred Musculoskeletal: No lower extremity tenderness nor edema.  No joint effusions. Neurologic:  Normal speech and language. No gross focal neurologic deficits are appreciated. Speech is normal. No gait instability. Skin:  Skin is warm, dry and intact. No rash noted. Psychiatric: Mood and affect are normal. Speech and behavior are normal.  ____________________________________________   LABS (all labs ordered are listed, but only abnormal results are  displayed)  Labs Reviewed  BASIC METABOLIC PANEL - Abnormal; Notable for the following:    Glucose, Bld 112 (*)    All other components within normal limits  TSH - Abnormal; Notable for the following:    TSH 0.110 (*)    All other components within normal limits  CBC  TROPONIN I  T4, FREE   ____________________________________________  EKG  ED ECG REPORT I, Joanne Gavel, the attending physician, personally viewed and interpreted this ECG.   Date: 10/03/2014  EKG Time:08:38  Rate: 174  Rhythm: supraventricular tachycardia  Axis: normal  Intervals:none  ST&T Change: nonspecific ST abnormality  ED ECG REPORT I, Joanne Gavel, the attending physician, personally viewed and interpreted this ECG.   Date: 10/03/2014  EKG Time: 08:45  Rate: 99  Rhythm: normal EKG, normal sinus rhythm  Axis: Normal  Intervals:normal intervals  ST&T Change: No acute ST segment change.  ____________________________________________  RADIOLOGY  CXR IMPRESSION: Mild cardiomegaly.  ____________________________________________   PROCEDURES  Procedure(s) performed: None  Critical Care performed: Yes, see critical care note(s). Total critical care time spent 35 minutes.  ____________________________________________   INITIAL IMPRESSION / ASSESSMENT AND PLAN / ED COURSE  Pertinent labs & imaging results that were available during my care of the patient were reviewed by me and considered in my medical decision making (see chart for details).  Barbara Mclaughlin is a 64 y.o. female with history of hypertension and depression who presents with sudden onset palpitations associated with lightheadedness that began just prior to arrival. On arrival to the emergency department, EKG showed supraventricular tachycardia with heart rate in the 170s. Rhythm was not responsive to vagal maneuvers. 12 mg of adenosine given with immediate resolution of supraventricular tachycardia and return to  normal sinus rhythm. Patient reports her lightheadedness has resolved. Plan for screening labs, chest x-ray, observation in the emergency department.  ----------------------------------------- 11:36 AM on 10/03/2014 -----------------------------------------  Patient observed for 3 hours in the emergency department without recurrence of SVT. She remains in normal sinus rhythm. Blood pressure stable. She is asymptomatic. Troponin negative, chest x-ray clear though there is a note of mild cardiomegaly. Normal T4 but her TSH is slightly abnormal. I discussed this with her and she reports she is scheduled for a second thyroid biopsy in the coming weeks. She will follow-up with her doctor. Return precautions discussed. DC home. ____________________________________________   FINAL CLINICAL IMPRESSION(S) / ED DIAGNOSES  Final diagnoses:  Heart palpitations  SVT (supraventricular tachycardia)      Joanne Gavel, MD 10/03/14 1138

## 2014-10-03 NOTE — ED Notes (Signed)
Pt informed to return if any life threatening symptoms occur.  

## 2014-10-06 ENCOUNTER — Other Ambulatory Visit: Payer: Self-pay | Admitting: Radiology

## 2014-10-08 ENCOUNTER — Encounter: Payer: Self-pay | Admitting: Internal Medicine

## 2014-10-08 ENCOUNTER — Ambulatory Visit
Admission: RE | Admit: 2014-10-08 | Discharge: 2014-10-08 | Disposition: A | Discharge status: Home / Self Care | Payer: BLUE CROSS/BLUE SHIELD | Source: Ambulatory Visit | Attending: Otolaryngology | Admitting: Otolaryngology

## 2014-10-08 ENCOUNTER — Ambulatory Visit (INDEPENDENT_AMBULATORY_CARE_PROVIDER_SITE_OTHER): Payer: BLUE CROSS/BLUE SHIELD | Admitting: Internal Medicine

## 2014-10-08 VITALS — BP 144/72 | HR 63 | Temp 97.3°F | Ht 66.5 in | Wt 180.5 lb

## 2014-10-08 DIAGNOSIS — E041 Nontoxic single thyroid nodule: Secondary | ICD-10-CM

## 2014-10-08 DIAGNOSIS — M1990 Unspecified osteoarthritis, unspecified site: Secondary | ICD-10-CM | POA: Insufficient documentation

## 2014-10-08 DIAGNOSIS — I10 Essential (primary) hypertension: Secondary | ICD-10-CM | POA: Diagnosis not present

## 2014-10-08 DIAGNOSIS — Z9884 Bariatric surgery status: Secondary | ICD-10-CM | POA: Diagnosis not present

## 2014-10-08 DIAGNOSIS — I471 Supraventricular tachycardia: Secondary | ICD-10-CM | POA: Diagnosis not present

## 2014-10-08 HISTORY — DX: Gastro-esophageal reflux disease without esophagitis: K21.9

## 2014-10-08 HISTORY — DX: Anemia, unspecified: D64.9

## 2014-10-08 HISTORY — DX: Cardiac murmur, unspecified: R01.1

## 2014-10-08 HISTORY — DX: Headache: R51

## 2014-10-08 HISTORY — DX: Cardiac arrhythmia, unspecified: I49.9

## 2014-10-08 HISTORY — DX: Major depressive disorder, single episode, unspecified: F32.9

## 2014-10-08 HISTORY — DX: Headache, unspecified: R51.9

## 2014-10-08 HISTORY — DX: Depression, unspecified: F32.A

## 2014-10-08 HISTORY — DX: Unspecified osteoarthritis, unspecified site: M19.90

## 2014-10-08 NOTE — Assessment & Plan Note (Signed)
BP Readings from Last 3 Encounters:  10/08/14 144/72  10/03/14 138/83  09/16/14 118/74   BP well controlled. Discussed potentially adding a betablocker however will hold for now as HR 60s.

## 2014-10-08 NOTE — Progress Notes (Signed)
Pre visit review using our clinic review tool, if applicable. No additional management support is needed unless otherwise documented below in the visit note. 

## 2014-10-08 NOTE — Assessment & Plan Note (Signed)
S/p recent biopsy of thyroid nodule which showed possible follicular neoplasm. Repeat biopsy was performed today. Will check thyroid profile including total T3 today. Follow up with Dr. Gabriel Carina this week or next week.

## 2014-10-08 NOTE — Assessment & Plan Note (Signed)
S/p adenosine conversion. NSR on exam today. EKG today.

## 2014-10-08 NOTE — Patient Instructions (Signed)
We will set up follow up with Dr. Gabriel Carina.

## 2014-10-08 NOTE — Procedures (Signed)
Successful LT THYROID NODULE FNA NO COMP STABLE PATH PENDING FULL REPORT IN PACS

## 2014-10-08 NOTE — Progress Notes (Signed)
Subjective:    Patient ID: Barbara Mclaughlin, female    DOB: [birth year], 64 y.o.   MRN: [id number]  HPI  64YO female presents for ER follow up.  ER 5/28. Woke up Sunday morning with heart racing and palpitations. No improvement with rest so went to ED. HR was in 170-190s. EKG showed SVT. Improved with Adenosine. Noted to have low TSH. Had biopsy of thyroid nodule today at [facility]. Followed by Dr. Gabriel Carina. (initial biopsy was inconclusive) Not feeling well. Lightheaded at times. No palpitations though.  Past medical, surgical, family and social history per today's encounter.  Review of Systems  Constitutional: Positive for fatigue. Negative for fever, chills, appetite change and unexpected weight change.  Eyes: Negative for visual disturbance.  Respiratory: Negative for shortness of breath.   Cardiovascular: Negative for chest pain, palpitations and leg swelling.  Gastrointestinal: Negative for abdominal pain.  Skin: Negative for color change and rash.  Hematological: Negative for adenopathy. Does not bruise/bleed easily.  Psychiatric/Behavioral: Negative for dysphoric mood. The patient is nervous/anxious.        Objective:    BP 144/72 mmHg  Pulse 63  Temp(Src) 97.3 F (36.3 C) (Oral)  Ht 5' 6.5" (1.689 m)  Wt 180 lb 8 oz (81.874 kg)  BMI 28.70 kg/m2  SpO2 98% Physical Exam  Constitutional: She is oriented to person, place, and time. She appears well-developed and well-nourished. No distress.  HENT:  Head: Normocephalic and atraumatic.  Right Ear: External ear normal.  Left Ear: External ear normal.  Nose: Nose normal.  Mouth/Throat: Oropharynx is clear and moist. No oropharyngeal exudate.  Eyes: Conjunctivae are normal. Pupils are equal, round, and reactive to light. Right eye exhibits no discharge. Left eye exhibits no discharge. No scleral icterus.  Neck: Normal range of motion. Neck supple. No tracheal deviation present. No thyromegaly present.    Cardiovascular:  Normal rate, regular rhythm, normal heart sounds and intact distal pulses.  Exam reveals no gallop and no friction rub.   No murmur heard. Pulmonary/Chest: Effort normal and breath sounds normal. No respiratory distress. She has no wheezes. She has no rales. She exhibits no tenderness.  Musculoskeletal: Normal range of motion. She exhibits no edema or tenderness.  Lymphadenopathy:    She has no cervical adenopathy.  Neurological: She is alert and oriented to person, place, and time. No cranial nerve deficit. She exhibits normal muscle tone. Coordination normal.  Skin: Skin is warm and dry. No rash noted. She is not diaphoretic. No erythema. No pallor.  Psychiatric: She has a normal mood and affect. Her behavior is normal. Judgment and thought content normal.          Assessment & Plan:   Problem List Items Addressed This Visit      Unprioritized   Hypertension    BP Readings from Last 3 Encounters:  10/08/14 144/72  10/03/14 138/83  09/16/14 118/74   BP well controlled. Discussed potentially adding a betablocker however will hold for now as HR 60s.      SVT (supraventricular tachycardia)    S/p adenosine conversion. NSR on exam today. EKG today.      Relevant Orders   EKG 12-Lead (Completed)   Thyroid nodule - Primary    S/p recent biopsy of thyroid nodule which showed possible follicular neoplasm. Repeat biopsy was performed today. Will check thyroid profile including total T3 today. Follow up with Dr. Gabriel Carina this week or next week.      Relevant Orders   Thyroid  Profile II       Return in about 4 weeks (around 11/05/2014) for Recheck.

## 2014-10-09 LAB — THYROID PROFILE II
Free Thyroxine Index: 2.6 (ref 1.2–4.9)
T3 Uptake Ratio: 32 % (ref 24–39)
T3, Total: 118 ng/dL (ref 71–180)
T4, Total: 8.2 ug/dL (ref 4.5–12.0)
TSH: 0.06 u[IU]/mL — ABNORMAL LOW (ref 0.450–4.500)

## 2014-10-28 ENCOUNTER — Telehealth: Payer: Self-pay | Admitting: Internal Medicine

## 2014-10-28 NOTE — Telephone Encounter (Signed)
Pt needs refill on Hydrocodone. Please advise pt/msn

## 2014-10-29 LAB — CYTOLOGY - NON PAP

## 2014-10-30 ENCOUNTER — Other Ambulatory Visit: Payer: Self-pay | Admitting: *Deleted

## 2014-10-30 ENCOUNTER — Encounter: Payer: Self-pay | Admitting: Interventional Radiology

## 2014-10-30 DIAGNOSIS — G8929 Other chronic pain: Secondary | ICD-10-CM

## 2014-10-30 MED ORDER — HYDROCODONE-ACETAMINOPHEN 5-325 MG PO TABS: 1.0000 | ORAL_TABLET | Freq: Three times a day (TID) | ORAL | Status: DC | PRN

## 2014-10-30 NOTE — Telephone Encounter (Signed)
Pt aware Rx ready for pick up 

## 2014-10-30 NOTE — Telephone Encounter (Signed)
Last OV 6.2.16.  Please advise

## 2014-10-30 NOTE — Telephone Encounter (Signed)
Fine to fill. 

## 2014-11-04 ENCOUNTER — Other Ambulatory Visit: Payer: Self-pay | Admitting: Internal Medicine

## 2014-11-04 DIAGNOSIS — E0590 Thyrotoxicosis, unspecified without thyrotoxic crisis or storm: Secondary | ICD-10-CM

## 2014-11-17 ENCOUNTER — Ambulatory Visit: Payer: BLUE CROSS/BLUE SHIELD | Admitting: Internal Medicine

## 2014-11-25 ENCOUNTER — Encounter
Admission: RE | Admit: 2014-11-25 | Discharge: 2014-11-25 | Disposition: A | Discharge status: Home / Self Care | Payer: BLUE CROSS/BLUE SHIELD | Source: Ambulatory Visit | Attending: Internal Medicine | Admitting: Internal Medicine

## 2014-11-25 DIAGNOSIS — E0590 Thyrotoxicosis, unspecified without thyrotoxic crisis or storm: Secondary | ICD-10-CM | POA: Diagnosis present

## 2014-11-25 MED ORDER — SODIUM IODIDE I-123 7.4 MBQ PO CAPS
151.2840 | ORAL_CAPSULE | Freq: Once | ORAL | Status: AC
  Administered 2014-11-25: 151.28 via ORAL

## 2014-11-26 ENCOUNTER — Encounter: Admission: RE | Admit: 2014-11-26 | Payer: BLUE CROSS/BLUE SHIELD | Source: Ambulatory Visit

## 2014-12-03 ENCOUNTER — Other Ambulatory Visit: Payer: Self-pay | Admitting: Internal Medicine

## 2014-12-03 DIAGNOSIS — E0590 Thyrotoxicosis, unspecified without thyrotoxic crisis or storm: Secondary | ICD-10-CM

## 2014-12-04 ENCOUNTER — Other Ambulatory Visit: Payer: Self-pay | Admitting: Internal Medicine

## 2014-12-04 NOTE — Telephone Encounter (Signed)
Ok to fill last OV 5/16?

## 2014-12-07 ENCOUNTER — Other Ambulatory Visit: Payer: Self-pay | Admitting: *Deleted

## 2014-12-07 ENCOUNTER — Telehealth: Payer: Self-pay | Admitting: *Deleted

## 2014-12-07 DIAGNOSIS — G8929 Other chronic pain: Secondary | ICD-10-CM

## 2014-12-07 MED ORDER — HYDROCODONE-ACETAMINOPHEN 5-325 MG PO TABS: 1.0000 | ORAL_TABLET | Freq: Three times a day (TID) | ORAL | Status: DC | PRN

## 2014-12-07 NOTE — Telephone Encounter (Signed)
Pt aware Rx ready for pick up 

## 2014-12-07 NOTE — Telephone Encounter (Signed)
Pt called requesting Hydrocodone refill.  Last OV 6.2.16.  Last refill 6.24.16.  Please advise refill

## 2014-12-07 NOTE — Telephone Encounter (Signed)
Fine to refill 

## 2014-12-10 DIAGNOSIS — D333 Benign neoplasm of cranial nerves: Secondary | ICD-10-CM | POA: Diagnosis not present

## 2014-12-10 DIAGNOSIS — E041 Nontoxic single thyroid nodule: Secondary | ICD-10-CM | POA: Diagnosis not present

## 2014-12-23 ENCOUNTER — Ambulatory Visit
Admission: RE | Admit: 2014-12-23 | Discharge: 2014-12-23 | Disposition: A | Discharge status: Home / Self Care | Payer: Medicare Other | Source: Ambulatory Visit | Attending: Internal Medicine | Admitting: Internal Medicine

## 2014-12-23 DIAGNOSIS — E0590 Thyrotoxicosis, unspecified without thyrotoxic crisis or storm: Secondary | ICD-10-CM

## 2014-12-23 DIAGNOSIS — E0510 Thyrotoxicosis with toxic single thyroid nodule without thyrotoxic crisis or storm: Secondary | ICD-10-CM | POA: Insufficient documentation

## 2014-12-23 MED ORDER — SODIUM IODIDE I 131 CAPSULE
30.8210 | Freq: Once | INTRAVENOUS | Status: AC | PRN
  Administered 2014-12-23: 30.821 via ORAL

## 2015-01-05 ENCOUNTER — Ambulatory Visit (INDEPENDENT_AMBULATORY_CARE_PROVIDER_SITE_OTHER): Payer: Medicare Other | Admitting: Internal Medicine

## 2015-01-05 ENCOUNTER — Encounter: Payer: Self-pay | Admitting: Internal Medicine

## 2015-01-05 VITALS — BP 126/83 | HR 71 | Temp 97.8°F | Ht 66.5 in | Wt 175.0 lb

## 2015-01-05 DIAGNOSIS — N952 Postmenopausal atrophic vaginitis: Secondary | ICD-10-CM | POA: Diagnosis not present

## 2015-01-05 DIAGNOSIS — Z23 Encounter for immunization: Secondary | ICD-10-CM

## 2015-01-05 DIAGNOSIS — E0590 Thyrotoxicosis, unspecified without thyrotoxic crisis or storm: Secondary | ICD-10-CM | POA: Diagnosis not present

## 2015-01-05 HISTORY — DX: Thyrotoxicosis, unspecified without thyrotoxic crisis or storm: E05.90

## 2015-01-05 MED ORDER — ESTRADIOL 0.1 MG/GM VA CREA: 1.0000 | TOPICAL_CREAM | Freq: Every day | VAGINAL | Status: DC

## 2015-01-05 NOTE — Progress Notes (Signed)
Pre visit review using our clinic review tool, if applicable. No additional management support is needed unless otherwise documented below in the visit note. 

## 2015-01-05 NOTE — Assessment & Plan Note (Signed)
Atrophic vaginitis. Having some irritation with estrogen vaginal tablet. Will change to cream. If no improvement, discussed exam and culture to be sure no infection or other cause of irritation. She declines exam today.

## 2015-01-05 NOTE — Patient Instructions (Signed)
Start Estrace cream, one applicator full daily for 1 week then 1-2 times per week.  Follow up 6 months and as needed.

## 2015-01-05 NOTE — Assessment & Plan Note (Signed)
Reviewed notes from Dr. Gabriel Carina. S/p thyroid ablation. Plan for repeat labs in 02/2015.

## 2015-01-05 NOTE — Progress Notes (Signed)
Subjective:    Patient ID: Barbara Mclaughlin, female    DOB: [birth year], 64 y.o.   MRN: [id number]  HPI  64YO female presents for follow up.  Completed radioactive iodine treatment 12/2014. Feeling well. Occasional fatigue. Plans to have recheck of TSH on 10/21 with follow up of endocrine that week.  Working on weight loss. Has added healthy snack at night if wakes.  Notes some vaginal irritation from Estrace tabs. Would like to go back to cream if possible.    Wt Readings from Last 3 Encounters:  01/05/15 175 lb (79.379 kg)  10/08/14 180 lb 8 oz (81.874 kg)  10/08/14 177 lb (80.287 kg)   BP Readings from Last 3 Encounters:  01/05/15 126/83  10/08/14 144/72  10/08/14 173/95     Past Medical History  Diagnosis Date  . Osteoporosis   . Hypertension   . Acoustic neuroma     right ear  . Thyroid nodule   . Dysrhythmia 0ne episode of tachycardia on 10/03/14  . Heart murmur bruit  . Depression   . GERD (gastroesophageal reflux disease)   . Headache   . Arthritis   . Basal cell carcinoma, eyelid   . Anemia   . Hyperthyroidism 01/05/2015   No family history on file. Past Surgical History  Procedure Laterality Date  . Gastric bypass    . Breast reduction surgery  1990  . Laparoscopic hysterectomy  07/2009  . Total knee arthroplasty  2013    Dr. Marry Guan, left  . Total knee arthroplasty Right 06/2013  . Eye surgery Bilateral cataract removal   Social History   Social History  . Marital Status: Married    Spouse Name: N/A  . Number of Children: N/A  . Years of Education: N/A   Social History Main Topics  . Smoking status: Never Smoker   . Smokeless tobacco: Never Used  . Alcohol Use: No  . Drug Use: No  . Sexual Activity: Not Asked   Other Topics Concern  . None   Social History Narrative    Review of Systems  Constitutional: Negative for fever, chills, appetite change, fatigue and unexpected weight change.  Eyes: Negative for visual disturbance.    Respiratory: Negative for shortness of breath.   Cardiovascular: Negative for chest pain, palpitations and leg swelling.  Gastrointestinal: Negative for abdominal pain, diarrhea and constipation.  Genitourinary: Positive for vaginal pain. Negative for vaginal bleeding and vaginal discharge.  Skin: Negative for color change and rash.  Hematological: Negative for adenopathy. Does not bruise/bleed easily.  Psychiatric/Behavioral: Negative for dysphoric mood. The patient is not nervous/anxious.        Objective:    BP 126/83 mmHg  Pulse 71  Temp(Src) 97.8 F (36.6 C) (Oral)  Ht 5' 6.5" (1.689 m)  Wt 175 lb (79.379 kg)  BMI 27.83 kg/m2  SpO2 96% Physical Exam  Constitutional: She is oriented to person, place, and time. She appears well-developed and well-nourished. No distress.  HENT:  Head: Normocephalic and atraumatic.  Right Ear: External ear normal.  Left Ear: External ear normal.  Nose: Nose normal.  Mouth/Throat: Oropharynx is clear and moist. No oropharyngeal exudate.  Eyes: Conjunctivae are normal. Pupils are equal, round, and reactive to light. Right eye exhibits no discharge. Left eye exhibits no discharge. No scleral icterus.  Neck: Normal range of motion. Neck supple. No tracheal deviation present. No thyromegaly present.  Cardiovascular: Normal rate, regular rhythm, normal heart sounds and intact distal pulses.  Exam reveals no  gallop and no friction rub.   No murmur heard. Pulmonary/Chest: Effort normal and breath sounds normal. No respiratory distress. She has no wheezes. She has no rales. She exhibits no tenderness.  Musculoskeletal: Normal range of motion. She exhibits no edema or tenderness.  Lymphadenopathy:    She has no cervical adenopathy.  Neurological: She is alert and oriented to person, place, and time. No cranial nerve deficit. She exhibits normal muscle tone. Coordination normal.  Skin: Skin is warm and dry. No rash noted. She is not diaphoretic. No  erythema. No pallor.  Psychiatric: She has a normal mood and affect. Her behavior is normal. Judgment and thought content normal.          Assessment & Plan:   Problem List Items Addressed This Visit      Unprioritized   Atrophic vaginitis - Primary    Atrophic vaginitis. Having some irritation with estrogen vaginal tablet. Will change to cream. If no improvement, discussed exam and culture to be sure no infection or other cause of irritation. She declines exam today.      Relevant Medications   estradiol (ESTRACE) 0.1 MG/GM vaginal cream   Hyperthyroidism    Reviewed notes from Dr. Gabriel Carina. S/p thyroid ablation. Plan for repeat labs in 02/2015.          Return in about 6 months (around 07/06/2015) for Recheck.

## 2015-01-18 ENCOUNTER — Telehealth: Payer: Self-pay | Admitting: *Deleted

## 2015-01-18 DIAGNOSIS — G8929 Other chronic pain: Secondary | ICD-10-CM

## 2015-01-18 MED ORDER — HYDROCODONE-ACETAMINOPHEN 5-325 MG PO TABS: 1.0000 | ORAL_TABLET | Freq: Three times a day (TID) | ORAL | Status: DC | PRN

## 2015-01-18 NOTE — Telephone Encounter (Signed)
Pt called requesting Vicodin refill.  Last OV 8.30.16.  Please advise refill

## 2015-01-18 NOTE — Telephone Encounter (Signed)
Fine to refill 

## 2015-01-18 NOTE — Telephone Encounter (Signed)
Filled per MD

## 2015-01-19 ENCOUNTER — Telehealth: Payer: Self-pay | Admitting: *Deleted

## 2015-01-19 NOTE — Telephone Encounter (Signed)
Patient requested a follow up on her pain medication

## 2015-01-19 NOTE — Telephone Encounter (Signed)
As per yesterday's phone note, requested Rx has been printed and ready for pick up.  Notified pt

## 2015-02-25 ENCOUNTER — Telehealth: Payer: Self-pay | Admitting: *Deleted

## 2015-02-25 NOTE — Telephone Encounter (Signed)
Please advise refill? 

## 2015-02-25 NOTE — Telephone Encounter (Signed)
Patient requested a medication refill for her hydrocodone.

## 2015-02-25 NOTE — Telephone Encounter (Signed)
When was last refill? Is she due for UDS?

## 2015-02-26 DIAGNOSIS — E0510 Thyrotoxicosis with toxic single thyroid nodule without thyrotoxic crisis or storm: Secondary | ICD-10-CM | POA: Diagnosis not present

## 2015-03-01 ENCOUNTER — Encounter: Payer: Self-pay | Admitting: *Deleted

## 2015-03-01 ENCOUNTER — Other Ambulatory Visit: Payer: Medicare Other

## 2015-03-01 ENCOUNTER — Other Ambulatory Visit: Payer: Self-pay

## 2015-03-01 DIAGNOSIS — Z79891 Long term (current) use of opiate analgesic: Secondary | ICD-10-CM | POA: Diagnosis not present

## 2015-03-01 DIAGNOSIS — G8929 Other chronic pain: Secondary | ICD-10-CM

## 2015-03-01 MED ORDER — HYDROCODONE-ACETAMINOPHEN 5-325 MG PO TABS: 1.0000 | ORAL_TABLET | Freq: Three times a day (TID) | ORAL | Status: DC | PRN

## 2015-03-01 NOTE — Telephone Encounter (Signed)
Looks like the Drug was reordered on 10/30/14.  Only assured Toxicology lab report I can locate is from 03/11/13.  Please advise?

## 2015-03-01 NOTE — Telephone Encounter (Signed)
OK. Must have new UDS prior to picking up this Rx. Must also have signed controlled substance agreement on file.

## 2015-03-01 NOTE — Telephone Encounter (Signed)
Spoke with patient.  She has no issue with giving a specimen prior to picking up the prescription.

## 2015-03-05 DIAGNOSIS — I10 Essential (primary) hypertension: Secondary | ICD-10-CM | POA: Diagnosis not present

## 2015-03-05 DIAGNOSIS — E0520 Thyrotoxicosis with toxic multinodular goiter without thyrotoxic crisis or storm: Secondary | ICD-10-CM | POA: Diagnosis not present

## 2015-03-26 ENCOUNTER — Other Ambulatory Visit: Payer: Self-pay | Admitting: Internal Medicine

## 2015-03-29 DIAGNOSIS — E0520 Thyrotoxicosis with toxic multinodular goiter without thyrotoxic crisis or storm: Secondary | ICD-10-CM | POA: Diagnosis not present

## 2015-03-31 ENCOUNTER — Encounter: Payer: Self-pay | Admitting: Internal Medicine

## 2015-04-06 DIAGNOSIS — Z961 Presence of intraocular lens: Secondary | ICD-10-CM | POA: Diagnosis not present

## 2015-04-19 ENCOUNTER — Telehealth: Payer: Self-pay | Admitting: Internal Medicine

## 2015-04-19 NOTE — Telephone Encounter (Signed)
Okay to refill? 

## 2015-04-19 NOTE — Telephone Encounter (Signed)
Last filled on 03/01/15, #60.... Okay to refill?

## 2015-04-19 NOTE — Telephone Encounter (Signed)
Pt called needing a refill on medication HYDROcodone-acetaminophen (NORCO/VICODIN) 5-325 MG tablet. Please call when it's ready to pick up. Thank You!

## 2015-04-20 ENCOUNTER — Other Ambulatory Visit: Payer: Self-pay | Admitting: *Deleted

## 2015-04-20 DIAGNOSIS — M19011 Primary osteoarthritis, right shoulder: Secondary | ICD-10-CM | POA: Diagnosis not present

## 2015-04-20 DIAGNOSIS — G8929 Other chronic pain: Secondary | ICD-10-CM

## 2015-04-20 DIAGNOSIS — M25511 Pain in right shoulder: Secondary | ICD-10-CM | POA: Diagnosis not present

## 2015-04-20 DIAGNOSIS — M7541 Impingement syndrome of right shoulder: Secondary | ICD-10-CM | POA: Diagnosis not present

## 2015-04-20 MED ORDER — HYDROCODONE-ACETAMINOPHEN 5-325 MG PO TABS: 1.0000 | ORAL_TABLET | Freq: Three times a day (TID) | ORAL | Status: DC | PRN

## 2015-04-20 NOTE — Telephone Encounter (Signed)
Left vm for pt to call the office.  Rx is ready for pick up

## 2015-05-05 DIAGNOSIS — M7541 Impingement syndrome of right shoulder: Secondary | ICD-10-CM | POA: Diagnosis not present

## 2015-06-04 ENCOUNTER — Encounter: Payer: Self-pay | Admitting: Internal Medicine

## 2015-06-04 ENCOUNTER — Ambulatory Visit (INDEPENDENT_AMBULATORY_CARE_PROVIDER_SITE_OTHER): Payer: Medicare Other | Admitting: Internal Medicine

## 2015-06-04 VITALS — BP 137/84 | HR 82 | Temp 97.9°F | Ht 67.0 in | Wt 186.2 lb

## 2015-06-04 DIAGNOSIS — G4700 Insomnia, unspecified: Secondary | ICD-10-CM | POA: Diagnosis not present

## 2015-06-04 DIAGNOSIS — G8929 Other chronic pain: Secondary | ICD-10-CM | POA: Diagnosis not present

## 2015-06-04 DIAGNOSIS — E0590 Thyrotoxicosis, unspecified without thyrotoxic crisis or storm: Secondary | ICD-10-CM | POA: Diagnosis not present

## 2015-06-04 DIAGNOSIS — R5382 Chronic fatigue, unspecified: Secondary | ICD-10-CM | POA: Diagnosis not present

## 2015-06-04 LAB — COMPREHENSIVE METABOLIC PANEL
ALK PHOS: 76 U/L (ref 39–117)
ALT: 17 U/L (ref 0–35)
AST: 22 U/L (ref 0–37)
Albumin: 4.2 g/dL (ref 3.5–5.2)
BUN: 15 mg/dL (ref 6–23)
CO2: 30 mEq/L (ref 19–32)
Calcium: 9.3 mg/dL (ref 8.4–10.5)
Chloride: 101 mEq/L (ref 96–112)
Creatinine, Ser: 0.72 mg/dL (ref 0.40–1.20)
GFR: 86.28 mL/min (ref >60.00)
GLUCOSE: 99 mg/dL (ref 70–99)
POTASSIUM: 4.1 meq/L (ref 3.5–5.1)
SODIUM: 137 meq/L (ref 135–145)
TOTAL PROTEIN: 6.9 g/dL (ref 6.0–8.3)
Total Bilirubin: 0.3 mg/dL (ref 0.2–1.2)

## 2015-06-04 LAB — CBC WITH DIFFERENTIAL/PLATELET
BASOS ABS: 0.1 10*3/uL (ref 0.0–0.1)
Basophils Relative: 0.9 % (ref 0.0–3.0)
EOS ABS: 0 10*3/uL (ref 0.0–0.7)
EOS PCT: 0.2 % (ref 0.0–5.0)
HCT: 38.1 % (ref 36.0–46.0)
Hemoglobin: 12.8 g/dL (ref 12.0–15.0)
LYMPHS ABS: 1.5 10*3/uL (ref 0.7–4.0)
Lymphocytes Relative: 24.9 % (ref 12.0–46.0)
MCHC: 33.6 g/dL (ref 30.0–36.0)
MCV: 90.8 fl (ref 78.0–100.0)
MONO ABS: 0.6 10*3/uL (ref 0.1–1.0)
Monocytes Relative: 10.3 % (ref 3.0–12.0)
NEUTROS PCT: 63.7 % (ref 43.0–77.0)
Neutro Abs: 3.7 10*3/uL (ref 1.4–7.7)
Platelets: 308 10*3/uL (ref 150.0–400.0)
RBC: 4.2 Mil/uL (ref 3.87–5.11)
RDW: 13.3 % (ref 11.5–15.5)
WBC: 5.8 10*3/uL (ref 4.0–10.5)

## 2015-06-04 LAB — T4, FREE: FREE T4: 0.67 ng/dL (ref 0.60–1.60)

## 2015-06-04 LAB — TSH: TSH: 5.05 u[IU]/mL — ABNORMAL HIGH (ref 0.35–4.50)

## 2015-06-04 LAB — VITAMIN D 25 HYDROXY (VIT D DEFICIENCY, FRACTURES): VITD: 27.39 ng/mL — ABNORMAL LOW (ref 30.00–100.00)

## 2015-06-04 MED ORDER — HYDROCODONE-ACETAMINOPHEN 5-325 MG PO TABS
1.0000 | ORAL_TABLET | Freq: Three times a day (TID) | ORAL | Status: DC | PRN
Start: 2015-06-04 — End: 2015-07-16

## 2015-06-04 NOTE — Progress Notes (Signed)
Subjective:    Patient ID: Barbara Mclaughlin, female    DOB: 29-Jun-1950, 65 y.o.   MRN: [id number]  HPI  65YO female presents for follow up.  Had radiation therapy for hyperthyroidism in 12/2014. Since that time, having bloating, fatigue increased appetite, heat intolerance, weight gain, constipation. Increased Ducolax to bid with some improvement. Having trouble sleeping. Taking Nyquil with minimal improvement.  Chronic pain - Taking Hydrocodone typically once daily in the morning. Sometimes twice daily. Recent UDS was negative for hydrocodone and she has been receiving 60 tabs monthly. She reports she was out of the medication for 2 days prior to the UDS. Pain symptoms of aching in joints not completely controlled with hydrocodone. She also received a local steroid injection right shoulder 4 weeks ago.   Wt Readings from Last 3 Encounters:  06/04/15 186 lb 4 oz (84.482 kg)  01/05/15 175 lb (79.379 kg)  10/08/14 180 lb 8 oz (81.874 kg)   BP Readings from Last 3 Encounters:  06/04/15 137/84  01/05/15 126/83  10/08/14 144/72    Past Medical History  Diagnosis Date  . Osteoporosis   . Hypertension   . Acoustic neuroma (HCC)     right ear  . Thyroid nodule   . Dysrhythmia 0ne episode of tachycardia on 10/03/14  . Heart murmur bruit  . Depression   . GERD (gastroesophageal reflux disease)   . Headache   . Arthritis   . Basal cell carcinoma, eyelid   . Anemia   . Hyperthyroidism 01/05/2015   No family history on file. Past Surgical History  Procedure Laterality Date  . Gastric bypass    . Breast reduction surgery  1990  . Laparoscopic hysterectomy  07/2009  . Total knee arthroplasty  2013    Dr. Marry Guan, left  . Total knee arthroplasty Right 06/2013  . Eye surgery Bilateral cataract removal   Social History   Social History  . Marital Status: Married    Spouse Name: N/A  . Number of Children: N/A  . Years of Education: N/A   Social History Main Topics  .  Smoking status: Never Smoker   . Smokeless tobacco: Never Used  . Alcohol Use: No  . Drug Use: No  . Sexual Activity: Not Asked   Other Topics Concern  . None   Social History Narrative    Review of Systems  Constitutional: Positive for unexpected weight change. Negative for fever, chills, appetite change and fatigue.  Eyes: Negative for visual disturbance.  Respiratory: Negative for cough, shortness of breath and wheezing.   Cardiovascular: Negative for chest pain and leg swelling.  Gastrointestinal: Positive for constipation. Negative for nausea, vomiting, abdominal pain, diarrhea and blood in stool.  Endocrine: Positive for heat intolerance.  Musculoskeletal: Positive for myalgias and arthralgias.  Skin: Negative for color change and rash.  Hematological: Negative for adenopathy. Does not bruise/bleed easily.  Psychiatric/Behavioral: Positive for sleep disturbance. Negative for suicidal ideas and dysphoric mood. The patient is not nervous/anxious.        Objective:    BP 137/84 mmHg  Pulse 82  Temp(Src) 97.9 F (36.6 C) (Oral)  Ht 5\' 7"  (1.702 m)  Wt 186 lb 4 oz (84.482 kg)  BMI 29.16 kg/m2  SpO2 96% Physical Exam  Constitutional: She is oriented to person, place, and time. She appears well-developed and well-nourished. No distress.  HENT:  Head: Normocephalic and atraumatic.  Right Ear: External ear normal.  Left Ear: External ear normal.  Nose: Nose normal.  Mouth/Throat: Oropharynx is clear and moist. No oropharyngeal exudate.  Eyes: Conjunctivae are normal. Pupils are equal, round, and reactive to light. Right eye exhibits no discharge. Left eye exhibits no discharge. No scleral icterus.  Neck: Normal range of motion. Neck supple. No tracheal deviation present. No thyromegaly present.  Cardiovascular: Normal rate, regular rhythm, normal heart sounds and intact distal pulses.  Exam reveals no gallop and no friction rub.   No murmur heard. Pulmonary/Chest: Effort  normal and breath sounds normal. No respiratory distress. She has no wheezes. She has no rales. She exhibits no tenderness.  Musculoskeletal: Normal range of motion. She exhibits no edema or tenderness.  Lymphadenopathy:    She has no cervical adenopathy.  Neurological: She is alert and oriented to person, place, and time. No cranial nerve deficit. She exhibits normal muscle tone. Coordination normal.  Skin: Skin is warm and dry. No rash noted. She is not diaphoretic. No erythema. No pallor.  Psychiatric: She has a normal mood and affect. Her behavior is normal. Judgment and thought content normal.          Assessment & Plan:   Problem List Items Addressed This Visit      Unprioritized   Chronic fatigue   Relevant Orders   CBC w/Diff   Comprehensive metabolic panel   VITAMIN D 25 Hydroxy (Vit-D Deficiency, Fractures)   Chronic pain    Chronic pain secondary to arthritis. Her recent UDS was neg for Hydrocodone, however she reports she was out of medication. Will recheck UDS today. Discussed risks and limitations of narcotics for chronic pain.      Relevant Medications   HYDROcodone-acetaminophen (NORCO/VICODIN) 5-325 MG tablet   Hyperthyroidism - Primary    S/p radioactive iodine ablation of the thyroid. Now symptomatic with fatigue, temp intolerance, constipation. Will check TSH and Free T4 with labs.      Relevant Orders   TSH   T4, free   Insomnia    Discussed adding medication to help with insomnia, but will hold for now. Continue prn Benadryl. She will call if symptoms not improving.          Return in about 4 weeks (around 07/02/2015) for Recheck.

## 2015-06-04 NOTE — Progress Notes (Signed)
Pre visit review using our clinic review tool, if applicable. No additional management support is needed unless otherwise documented below in the visit note. 

## 2015-06-04 NOTE — Assessment & Plan Note (Signed)
Chronic pain secondary to arthritis. Her recent UDS was neg for Hydrocodone, however she reports she was out of medication. Will recheck UDS today. Discussed risks and limitations of narcotics for chronic pain.

## 2015-06-04 NOTE — Assessment & Plan Note (Signed)
Discussed adding medication to help with insomnia, but will hold for now. Continue prn Benadryl. She will call if symptoms not improving.

## 2015-06-04 NOTE — Patient Instructions (Addendum)
Labs today.  Follow up 4 weeks. 

## 2015-06-04 NOTE — Assessment & Plan Note (Signed)
S/p radioactive iodine ablation of the thyroid. Now symptomatic with fatigue, temp intolerance, constipation. Will check TSH and Free T4 with labs.

## 2015-06-09 ENCOUNTER — Encounter: Payer: Self-pay | Admitting: Internal Medicine

## 2015-06-09 DIAGNOSIS — Z79891 Long term (current) use of opiate analgesic: Secondary | ICD-10-CM | POA: Diagnosis not present

## 2015-06-10 ENCOUNTER — Other Ambulatory Visit: Payer: Self-pay | Admitting: Internal Medicine

## 2015-06-10 MED ORDER — BUPROPION HCL ER (XL) 300 MG PO TB24: 300.0000 mg | ORAL_TABLET | Freq: Every day | ORAL | Status: DC

## 2015-06-22 ENCOUNTER — Other Ambulatory Visit: Payer: Self-pay | Admitting: Internal Medicine

## 2015-06-23 DIAGNOSIS — Z1283 Encounter for screening for malignant neoplasm of skin: Secondary | ICD-10-CM | POA: Diagnosis not present

## 2015-06-23 DIAGNOSIS — L570 Actinic keratosis: Secondary | ICD-10-CM | POA: Diagnosis not present

## 2015-06-23 DIAGNOSIS — Z872 Personal history of diseases of the skin and subcutaneous tissue: Secondary | ICD-10-CM | POA: Diagnosis not present

## 2015-06-23 DIAGNOSIS — B078 Other viral warts: Secondary | ICD-10-CM | POA: Diagnosis not present

## 2015-06-23 DIAGNOSIS — D1801 Hemangioma of skin and subcutaneous tissue: Secondary | ICD-10-CM | POA: Diagnosis not present

## 2015-06-29 ENCOUNTER — Other Ambulatory Visit: Payer: Self-pay | Admitting: Internal Medicine

## 2015-07-06 ENCOUNTER — Encounter: Payer: Self-pay | Admitting: Internal Medicine

## 2015-07-06 ENCOUNTER — Ambulatory Visit (INDEPENDENT_AMBULATORY_CARE_PROVIDER_SITE_OTHER): Payer: Medicare Other | Admitting: Internal Medicine

## 2015-07-06 VITALS — BP 127/86 | HR 72 | Temp 97.9°F | Ht 67.0 in | Wt 185.5 lb

## 2015-07-06 DIAGNOSIS — R413 Other amnesia: Secondary | ICD-10-CM | POA: Diagnosis not present

## 2015-07-06 DIAGNOSIS — F32A Depression, unspecified: Secondary | ICD-10-CM

## 2015-07-06 DIAGNOSIS — F329 Major depressive disorder, single episode, unspecified: Secondary | ICD-10-CM

## 2015-07-06 DIAGNOSIS — G8929 Other chronic pain: Secondary | ICD-10-CM

## 2015-07-06 DIAGNOSIS — E041 Nontoxic single thyroid nodule: Secondary | ICD-10-CM | POA: Diagnosis not present

## 2015-07-06 LAB — TSH: TSH: 5.74 u[IU]/mL — AB (ref 0.35–4.50)

## 2015-07-06 LAB — T4, FREE: FREE T4: 0.85 ng/dL (ref 0.60–1.60)

## 2015-07-06 MED ORDER — BUPROPION HCL ER (XL) 300 MG PO TB24: 300.0000 mg | ORAL_TABLET | Freq: Every day | ORAL | Status: DC

## 2015-07-06 NOTE — Assessment & Plan Note (Signed)
Symptoms well controlled on Wellbutrin. Will continue. 

## 2015-07-06 NOTE — Progress Notes (Signed)
Pre visit review using our clinic review tool, if applicable. No additional management support is needed unless otherwise documented below in the visit note. 

## 2015-07-06 NOTE — Assessment & Plan Note (Signed)
Recent TSH was slightly elevated. Will repeat TSH with labs today.

## 2015-07-06 NOTE — Patient Instructions (Addendum)
Labs today to recheck thyroid function.  Follow up 3 months.

## 2015-07-06 NOTE — Assessment & Plan Note (Signed)
Chronic low back pain. Symptoms have been controlled with prn Hydrocodone. However, we discussed that both recent UDS were negative., and not consistent with prescribing. Will recheck UDS today. If negative, no further controlled drug Rx from this office.

## 2015-07-06 NOTE — Assessment & Plan Note (Signed)
Recent episode of memory loss while driving. Discussed some additional workup and potential causes. She prefers to hold off for now. She will call or RTC if any recurrent symptoms.

## 2015-07-06 NOTE — Progress Notes (Signed)
Subjective:    Patient ID: Barbara Mclaughlin, female    DOB: [birth year], 65 y.o.   MRN: [id number]  HPI  65YO female presents for follow up.  Fatigue has improved some. Sleeping better. Some constipation, however improved with OTC laxatives.  Had recent episode of memory loss. Got lost when driving. No focal symptoms. No numbness. Does not want to pursue further evaluation of this right now.  Chronic pain - Symptoms well controlled with prn Hydrocodone. Last took medication yesterday. Of note, last two drug screens have been negative for hydrocodone or metabolites. Pt has received Rx for 60tabs monthly. She reports using the medication on a regular basis to control back pain, esp at night.   Wt Readings from Last 3 Encounters:  07/06/15 185 lb 8 oz (84.142 kg)  06/04/15 186 lb 4 oz (84.482 kg)  01/05/15 175 lb (79.379 kg)   BP Readings from Last 3 Encounters:  07/06/15 127/86  06/04/15 137/84  01/05/15 126/83    Past Medical History  Diagnosis Date  . Osteoporosis   . Hypertension   . Acoustic neuroma (HCC)     right ear  . Thyroid nodule   . Dysrhythmia 0ne episode of tachycardia on 10/03/14  . Heart murmur bruit  . Depression   . GERD (gastroesophageal reflux disease)   . Headache   . Arthritis   . Basal cell carcinoma, eyelid   . Anemia   . Hyperthyroidism 01/05/2015   No family history on file. Past Surgical History  Procedure Laterality Date  . Gastric bypass    . Breast reduction surgery  1990  . Laparoscopic hysterectomy  07/2009  . Total knee arthroplasty  2013    Dr. Marry Guan, left  . Total knee arthroplasty Right 06/2013  . Eye surgery Bilateral cataract removal   Social History   Social History  . Marital Status: Married    Spouse Name: N/A  . Number of Children: N/A  . Years of Education: N/A   Social History Main Topics  . Smoking status: Never Smoker   . Smokeless tobacco: Never Used  . Alcohol Use: No  . Drug Use: No  . Sexual  Activity: Not Asked   Other Topics Concern  . None   Social History Narrative    Review of Systems  Constitutional: Negative for fever, chills, appetite change, fatigue and unexpected weight change.  Eyes: Negative for visual disturbance.  Respiratory: Negative for cough and shortness of breath.   Cardiovascular: Negative for chest pain and leg swelling.  Gastrointestinal: Positive for constipation. Negative for nausea, vomiting, abdominal pain and diarrhea.  Musculoskeletal: Positive for myalgias, back pain and arthralgias.  Skin: Negative for color change and rash.  Hematological: Negative for adenopathy. Does not bruise/bleed easily.  Psychiatric/Behavioral: Negative for dysphoric mood. The patient is not nervous/anxious.        Objective:    BP 127/86 mmHg  Pulse 72  Temp(Src) 97.9 F (36.6 C) (Oral)  Ht 5\' 7"  (1.702 m)  Wt 185 lb 8 oz (84.142 kg)  BMI 29.05 kg/m2  SpO2 95% Physical Exam  Constitutional: She is oriented to person, place, and time. She appears well-developed and well-nourished. No distress.  HENT:  Head: Normocephalic and atraumatic.  Right Ear: External ear normal.  Left Ear: External ear normal.  Nose: Nose normal.  Mouth/Throat: Oropharynx is clear and moist. No oropharyngeal exudate.  Eyes: Conjunctivae are normal. Pupils are equal, round, and reactive to light. Right eye exhibits no discharge. Left  eye exhibits no discharge. No scleral icterus.  Neck: Normal range of motion. Neck supple. No tracheal deviation present. No thyromegaly present.  Cardiovascular: Normal rate, regular rhythm, normal heart sounds and intact distal pulses.  Exam reveals no gallop and no friction rub.   No murmur heard. Pulmonary/Chest: Effort normal and breath sounds normal. No respiratory distress. She has no wheezes. She has no rales. She exhibits no tenderness.  Musculoskeletal: Normal range of motion. She exhibits no edema or tenderness.  Lymphadenopathy:    She has  no cervical adenopathy.  Neurological: She is alert and oriented to person, place, and time. No cranial nerve deficit. She exhibits normal muscle tone. Coordination normal.  Skin: Skin is warm and dry. No rash noted. She is not diaphoretic. No erythema. No pallor.  Psychiatric: She has a normal mood and affect. Her behavior is normal. Judgment and thought content normal.          Assessment & Plan:   Problem List Items Addressed This Visit      Unprioritized   Chronic pain    Chronic low back pain. Symptoms have been controlled with prn Hydrocodone. However, we discussed that both recent UDS were negative., and not consistent with prescribing. Will recheck UDS today. If negative, no further controlled drug Rx from this office.      Relevant Medications   buPROPion (WELLBUTRIN XL) 300 MG 24 hr tablet   Depression    Symptoms well controlled on Wellbutrin. Will continue.      Relevant Medications   buPROPion (WELLBUTRIN XL) 300 MG 24 hr tablet   Memory change    Recent episode of memory loss while driving. Discussed some additional workup and potential causes. She prefers to hold off for now. She will call or RTC if any recurrent symptoms.      Thyroid nodule - Primary    Recent TSH was slightly elevated. Will repeat TSH with labs today.      Relevant Orders   TSH   T4, free       Return in about 3 months (around 10/03/2015) for Recheck.  Ronette Deter, MD Internal Medicine [facility]

## 2015-07-09 ENCOUNTER — Encounter: Payer: Self-pay | Admitting: Internal Medicine

## 2015-07-09 DIAGNOSIS — Z79891 Long term (current) use of opiate analgesic: Secondary | ICD-10-CM | POA: Diagnosis not present

## 2015-07-15 ENCOUNTER — Telehealth: Payer: Self-pay

## 2015-07-15 DIAGNOSIS — G8929 Other chronic pain: Secondary | ICD-10-CM

## 2015-07-15 NOTE — Telephone Encounter (Signed)
Pt will not be able to get a refill on this med until verifcation has been sent to Dr. Gilford Rile of toxicology report.

## 2015-07-16 NOTE — Telephone Encounter (Signed)
Pt was given 60 tablets of hydrocodone and her drug toxicology has come back not detected. Pt will not be able to get a refill on this at this office under any circumstances, Requested by Dr. Gilford Rile. Documentation from her last office visit supports this situation.

## 2015-07-16 NOTE — Addendum Note (Signed)
Addended by: Vergia Alcon D on: 07/16/2015 08:20 AM   Modules accepted: Orders, Medications

## 2015-07-22 ENCOUNTER — Encounter: Payer: Self-pay | Admitting: Internal Medicine

## 2015-08-13 DIAGNOSIS — M461 Sacroiliitis, not elsewhere classified: Secondary | ICD-10-CM | POA: Diagnosis not present

## 2015-08-27 DIAGNOSIS — E0520 Thyrotoxicosis with toxic multinodular goiter without thyrotoxic crisis or storm: Secondary | ICD-10-CM | POA: Diagnosis not present

## 2015-09-03 DIAGNOSIS — E042 Nontoxic multinodular goiter: Secondary | ICD-10-CM | POA: Diagnosis not present

## 2015-09-03 DIAGNOSIS — E890 Postprocedural hypothyroidism: Secondary | ICD-10-CM

## 2015-09-03 HISTORY — DX: Postprocedural hypothyroidism: E89.0

## 2015-09-09 ENCOUNTER — Other Ambulatory Visit: Payer: Self-pay | Admitting: Otolaryngology

## 2015-09-09 DIAGNOSIS — D333 Benign neoplasm of cranial nerves: Secondary | ICD-10-CM

## 2015-09-09 DIAGNOSIS — D3610 Benign neoplasm of peripheral nerves and autonomic nervous system, unspecified: Secondary | ICD-10-CM

## 2015-09-14 ENCOUNTER — Other Ambulatory Visit: Payer: Self-pay | Admitting: Internal Medicine

## 2015-09-14 NOTE — Telephone Encounter (Signed)
Last OV 07/06/2015 Last filled 08/11/2014

## 2015-09-29 ENCOUNTER — Ambulatory Visit
Admission: RE | Admit: 2015-09-29 | Discharge: 2015-09-29 | Disposition: A | Discharge status: Home / Self Care | Payer: Medicare Other | Source: Ambulatory Visit | Attending: Otolaryngology | Admitting: Otolaryngology

## 2015-09-29 DIAGNOSIS — D333 Benign neoplasm of cranial nerves: Secondary | ICD-10-CM | POA: Insufficient documentation

## 2015-09-29 DIAGNOSIS — G9389 Other specified disorders of brain: Secondary | ICD-10-CM | POA: Diagnosis not present

## 2015-09-29 DIAGNOSIS — D3610 Benign neoplasm of peripheral nerves and autonomic nervous system, unspecified: Secondary | ICD-10-CM | POA: Insufficient documentation

## 2015-09-29 LAB — POCT I-STAT CREATININE: CREATININE: 0.7 mg/dL (ref 0.44–1.00)

## 2015-09-29 MED ORDER — GADOBENATE DIMEGLUMINE 529 MG/ML IV SOLN
20.0000 mL | Freq: Once | INTRAVENOUS | Status: AC | PRN
  Administered 2015-09-29: 16 mL via INTRAVENOUS

## 2015-09-30 ENCOUNTER — Other Ambulatory Visit: Payer: Self-pay | Admitting: Otolaryngology

## 2015-09-30 DIAGNOSIS — G9001 Carotid sinus syncope: Secondary | ICD-10-CM

## 2015-09-30 DIAGNOSIS — D333 Benign neoplasm of cranial nerves: Secondary | ICD-10-CM | POA: Diagnosis not present

## 2015-09-30 DIAGNOSIS — E041 Nontoxic single thyroid nodule: Secondary | ICD-10-CM | POA: Diagnosis not present

## 2015-10-05 ENCOUNTER — Encounter: Payer: Self-pay | Admitting: Internal Medicine

## 2015-10-05 ENCOUNTER — Ambulatory Visit (INDEPENDENT_AMBULATORY_CARE_PROVIDER_SITE_OTHER): Payer: Medicare Other | Admitting: Internal Medicine

## 2015-10-05 VITALS — BP 136/92 | HR 80 | Ht 67.0 in | Wt 187.6 lb

## 2015-10-05 DIAGNOSIS — F329 Major depressive disorder, single episode, unspecified: Secondary | ICD-10-CM

## 2015-10-05 DIAGNOSIS — E663 Overweight: Secondary | ICD-10-CM

## 2015-10-05 DIAGNOSIS — F32A Depression, unspecified: Secondary | ICD-10-CM

## 2015-10-05 DIAGNOSIS — D3610 Benign neoplasm of peripheral nerves and autonomic nervous system, unspecified: Secondary | ICD-10-CM

## 2015-10-05 MED ORDER — VENLAFAXINE HCL ER 37.5 MG PO CP24: 37.5000 mg | ORAL_CAPSULE | Freq: Every day | ORAL | Status: DC

## 2015-10-05 NOTE — Patient Instructions (Addendum)
Start Effexor 37.5mg  daily.  Email with update in 1-2 weeks.  Consider referral to Dr. Bonney Aid in Pain Management in [place].  Follow up in 4 weeks.

## 2015-10-05 NOTE — Assessment & Plan Note (Signed)
Wt Readings from Last 3 Encounters:  10/05/15 187 lb 9.6 oz (85.095 kg)  07/06/15 185 lb 8 oz (84.142 kg)  06/04/15 186 lb 4 oz (84.482 kg)   Encouraged healthy diet and exercise. Discussed some potential medications to help with appetite.

## 2015-10-05 NOTE — Assessment & Plan Note (Signed)
Symptoms of depression poorly controlled. Pt stopped Wellbutrin. Will add Effexor. Discussed potential risks and benefits of this medication. Follow up by email in 1-2 weeks and by visit in 4 weeks.

## 2015-10-05 NOTE — Progress Notes (Signed)
Subjective:    Patient ID: Barbara Mclaughlin, female    DOB: [birth year], 65 y.o.   MRN: [id number]  HPI  65YO female presents for follow up.  Last seen in 06/2015 for chronic pain. UDS x3 were negative for Hydrocodone as prescribed.  Recently seen by ENT for hearing loss. MRI brain showed previous acoustic schwannoma more consistent with hypoglossal schwannoma. No change in size from previous.  Scheduled for carotid doppler in June.  Stopped Wellbutrin. Had some dry mouth with this medication. No improvement in symptoms of depression. Not as active recently.  Having persistent chronic back pain. Aching pain. Does not generally radiate. Went for [facility] with minimal improvement. Also seeing a chiropractor.   Wt Readings from Last 3 Encounters:  10/05/15 187 lb 9.6 oz (85.095 kg)  07/06/15 185 lb 8 oz (84.142 kg)  06/04/15 186 lb 4 oz (84.482 kg)   BP Readings from Last 3 Encounters:  10/05/15 136/92  07/06/15 127/86  06/04/15 137/84    Past Medical History  Diagnosis Date  . Osteoporosis   . Hypertension   . Acoustic neuroma (HCC)     right ear  . Thyroid nodule   . Dysrhythmia 0ne episode of tachycardia on 10/03/14  . Heart murmur bruit  . Depression   . GERD (gastroesophageal reflux disease)   . Headache   . Arthritis   . Basal cell carcinoma, eyelid   . Anemia   . Hyperthyroidism 01/05/2015   No family history on file. Past Surgical History  Procedure Laterality Date  . Gastric bypass    . Breast reduction surgery  1990  . Laparoscopic hysterectomy  07/2009  . Total knee arthroplasty  2013    Dr. Marry Guan, left  . Total knee arthroplasty Right 06/2013  . Eye surgery Bilateral cataract removal   Social History   Social History  . Marital Status: Married    Spouse Name: N/A  . Number of Children: N/A  . Years of Education: N/A   Social History Main Topics  . Smoking status: Never Smoker   . Smokeless tobacco: Never Used  . Alcohol Use: No  . Drug Use:  No  . Sexual Activity: Not Asked   Other Topics Concern  . None   Social History Narrative    Review of Systems  Constitutional: Negative for fever, chills, appetite change, fatigue and unexpected weight change.  Eyes: Negative for visual disturbance.  Respiratory: Negative for cough and shortness of breath.   Cardiovascular: Negative for chest pain and leg swelling.  Gastrointestinal: Negative for nausea, vomiting, abdominal pain, diarrhea and constipation.  Musculoskeletal: Positive for myalgias, back pain and arthralgias.  Skin: Negative for color change and rash.  Hematological: Negative for adenopathy. Does not bruise/bleed easily.  Psychiatric/Behavioral: Positive for sleep disturbance and dysphoric mood. Negative for suicidal ideas. The patient is not nervous/anxious.        Objective:    BP 136/92 mmHg  Pulse 80  Ht 5\' 7"  (1.702 m)  Wt 187 lb 9.6 oz (85.095 kg)  BMI 29.38 kg/m2  SpO2 97% Physical Exam  Constitutional: She is oriented to person, place, and time. She appears well-developed and well-nourished. No distress.  HENT:  Head: Normocephalic and atraumatic.  Right Ear: External ear normal.  Left Ear: External ear normal.  Nose: Nose normal.  Mouth/Throat: Oropharynx is clear and moist. No oropharyngeal exudate.  Eyes: Conjunctivae are normal. Pupils are equal, round, and reactive to light. Right eye exhibits no discharge. Left eye  exhibits no discharge. No scleral icterus.  Neck: Normal range of motion. Neck supple. No tracheal deviation present. No thyromegaly present.  Cardiovascular: Normal rate, regular rhythm, normal heart sounds and intact distal pulses.  Exam reveals no gallop and no friction rub.   No murmur heard. Pulmonary/Chest: Effort normal and breath sounds normal. No respiratory distress. She has no wheezes. She has no rales. She exhibits no tenderness.  Musculoskeletal: Normal range of motion. She exhibits no edema or tenderness.    Lymphadenopathy:    She has no cervical adenopathy.  Neurological: She is alert and oriented to person, place, and time. No cranial nerve deficit. She exhibits normal muscle tone. Coordination normal.  Skin: Skin is warm and dry. No rash noted. She is not diaphoretic. No erythema. No pallor.  Psychiatric: She has a normal mood and affect. Her behavior is normal. Judgment and thought content normal.          Assessment & Plan:   Problem List Items Addressed This Visit      Unprioritized   Depression - Primary    Symptoms of depression poorly controlled. Pt stopped Wellbutrin. Will add Effexor. Discussed potential risks and benefits of this medication. Follow up by email in 1-2 weeks and by visit in 4 weeks.      Relevant Medications   venlafaxine XR (EFFEXOR-XR) 37.5 MG 24 hr capsule   Overweight (BMI 25.0-29.9)    Wt Readings from Last 3 Encounters:  10/05/15 187 lb 9.6 oz (85.095 kg)  07/06/15 185 lb 8 oz (84.142 kg)  06/04/15 186 lb 4 oz (84.482 kg)   Encouraged healthy diet and exercise. Discussed some potential medications to help with appetite.       Schwannoma    Previously diagnosed with vestibular schwannoma, however now MRI suggests Hypoglossal schwannoma. ENT evaluation at [facility] pending. Will follow.          Return in about 4 weeks (around 11/02/2015) for Recheck.  Ronette Deter, MD Internal Medicine [facility]

## 2015-10-05 NOTE — Assessment & Plan Note (Signed)
Previously diagnosed with vestibular schwannoma, however now MRI suggests Hypoglossal schwannoma. ENT evaluation at [facility] pending. Will follow.

## 2015-10-07 ENCOUNTER — Encounter: Payer: Self-pay | Admitting: Internal Medicine

## 2015-10-07 DIAGNOSIS — M545 Low back pain: Secondary | ICD-10-CM

## 2015-10-11 ENCOUNTER — Encounter: Payer: Self-pay | Admitting: Internal Medicine

## 2015-10-14 ENCOUNTER — Ambulatory Visit
Admission: RE | Admit: 2015-10-14 | Discharge: 2015-10-14 | Disposition: A | Discharge status: Home / Self Care | Payer: Medicare Other | Source: Ambulatory Visit | Attending: Otolaryngology | Admitting: Otolaryngology

## 2015-10-14 DIAGNOSIS — G9001 Carotid sinus syncope: Secondary | ICD-10-CM | POA: Insufficient documentation

## 2015-10-14 DIAGNOSIS — I6521 Occlusion and stenosis of right carotid artery: Secondary | ICD-10-CM | POA: Insufficient documentation

## 2015-10-18 DIAGNOSIS — D3610 Benign neoplasm of peripheral nerves and autonomic nervous system, unspecified: Secondary | ICD-10-CM | POA: Diagnosis not present

## 2015-11-01 DIAGNOSIS — D333 Benign neoplasm of cranial nerves: Secondary | ICD-10-CM | POA: Diagnosis not present

## 2015-11-01 DIAGNOSIS — D3610 Benign neoplasm of peripheral nerves and autonomic nervous system, unspecified: Secondary | ICD-10-CM | POA: Diagnosis not present

## 2015-11-01 DIAGNOSIS — Z6828 Body mass index (BMI) 28.0-28.9, adult: Secondary | ICD-10-CM | POA: Diagnosis not present

## 2015-11-02 ENCOUNTER — Ambulatory Visit: Payer: Medicare Other | Admitting: Physical Therapy

## 2015-11-04 ENCOUNTER — Encounter: Payer: Medicare Other | Admitting: Physical Therapy

## 2015-11-04 DIAGNOSIS — M5416 Radiculopathy, lumbar region: Secondary | ICD-10-CM | POA: Diagnosis not present

## 2015-11-05 DIAGNOSIS — E890 Postprocedural hypothyroidism: Secondary | ICD-10-CM | POA: Diagnosis not present

## 2015-11-08 ENCOUNTER — Encounter: Payer: BLUE CROSS/BLUE SHIELD | Admitting: Physical Therapy

## 2015-11-11 ENCOUNTER — Encounter: Payer: BLUE CROSS/BLUE SHIELD | Admitting: Physical Therapy

## 2015-11-12 DIAGNOSIS — E890 Postprocedural hypothyroidism: Secondary | ICD-10-CM | POA: Diagnosis not present

## 2015-11-15 ENCOUNTER — Encounter: Payer: BLUE CROSS/BLUE SHIELD | Admitting: Physical Therapy

## 2015-11-17 ENCOUNTER — Other Ambulatory Visit: Payer: Self-pay | Admitting: Physician Assistant

## 2015-11-17 DIAGNOSIS — M5416 Radiculopathy, lumbar region: Secondary | ICD-10-CM

## 2015-11-18 ENCOUNTER — Encounter: Payer: BLUE CROSS/BLUE SHIELD | Admitting: Physical Therapy

## 2015-11-19 ENCOUNTER — Ambulatory Visit (INDEPENDENT_AMBULATORY_CARE_PROVIDER_SITE_OTHER): Payer: Medicare Other | Admitting: Internal Medicine

## 2015-11-19 ENCOUNTER — Ambulatory Visit
Admission: RE | Admit: 2015-11-19 | Discharge: 2015-11-19 | Disposition: A | Discharge status: Home / Self Care | Payer: Medicare Other | Source: Ambulatory Visit | Attending: Physician Assistant | Admitting: Physician Assistant

## 2015-11-19 ENCOUNTER — Encounter: Payer: Self-pay | Admitting: Internal Medicine

## 2015-11-19 VITALS — BP 134/100 | HR 89 | Ht 67.0 in | Wt 189.8 lb

## 2015-11-19 DIAGNOSIS — M5416 Radiculopathy, lumbar region: Secondary | ICD-10-CM | POA: Diagnosis not present

## 2015-11-19 DIAGNOSIS — M5116 Intervertebral disc disorders with radiculopathy, lumbar region: Secondary | ICD-10-CM | POA: Insufficient documentation

## 2015-11-19 DIAGNOSIS — G8929 Other chronic pain: Secondary | ICD-10-CM

## 2015-11-19 DIAGNOSIS — F32A Depression, unspecified: Secondary | ICD-10-CM

## 2015-11-19 DIAGNOSIS — E663 Overweight: Secondary | ICD-10-CM | POA: Diagnosis not present

## 2015-11-19 DIAGNOSIS — M5126 Other intervertebral disc displacement, lumbar region: Secondary | ICD-10-CM | POA: Diagnosis not present

## 2015-11-19 DIAGNOSIS — Z1239 Encounter for other screening for malignant neoplasm of breast: Secondary | ICD-10-CM

## 2015-11-19 DIAGNOSIS — F329 Major depressive disorder, single episode, unspecified: Secondary | ICD-10-CM | POA: Diagnosis not present

## 2015-11-19 DIAGNOSIS — I10 Essential (primary) hypertension: Secondary | ICD-10-CM

## 2015-11-19 NOTE — Assessment & Plan Note (Signed)
BP Readings from Last 3 Encounters:  11/19/15 134/100  10/05/15 136/92  07/06/15 127/86   BP elevated today. Possibly exacerbated by recent Prednisone taper. Will monitor. She will plan to follow up with new PCP in next few weeks with recheck of BP. Continue Losartan.

## 2015-11-19 NOTE — Progress Notes (Signed)
Subjective:    Patient ID: Barbara Mclaughlin, female    DOB: 1950/06/23, 65 y.o.   MRN: [id number]  HPI  65YO female presents for follow up.  Recently seen for depression. Started on Effexor, however she stopped taking the medication. Symptoms have been improved with Fluoxetine alone.  Chronic back pain - Now followed by chiropractor. Noted some improvement, however then had some worsening symptoms. Seen by Dr. Rogers Blocker. Had MRI Lumbar spine. Started on Prednisone taper. Huge improvement with this.  Worried about appetite and weight. Unable to tolerate Wellbutrin in the past. Would like to try Phentermine again.  Wt Readings from Last 3 Encounters:  11/19/15 189 lb 12.8 oz (86.093 kg)  10/05/15 187 lb 9.6 oz (85.095 kg)  07/06/15 185 lb 8 oz (84.142 kg)   BP Readings from Last 3 Encounters:  11/19/15 134/100  10/05/15 136/92  07/06/15 127/86    Past Medical History  Diagnosis Date  . Osteoporosis   . Hypertension   . Acoustic neuroma (HCC)     right ear  . Thyroid nodule   . Dysrhythmia 0ne episode of tachycardia on 10/03/14  . Heart murmur bruit  . Depression   . GERD (gastroesophageal reflux disease)   . Headache   . Arthritis   . Basal cell carcinoma, eyelid   . Anemia   . Hyperthyroidism 01/05/2015   No family history on file. Past Surgical History  Procedure Laterality Date  . Gastric bypass    . Breast reduction surgery  1990  . Laparoscopic hysterectomy  07/2009  . Total knee arthroplasty  2013    Dr. Marry Guan, left  . Total knee arthroplasty Right 06/2013  . Eye surgery Bilateral cataract removal   Social History   Social History  . Marital Status: Married    Spouse Name: N/A  . Number of Children: N/A  . Years of Education: N/A   Social History Main Topics  . Smoking status: Never Smoker   . Smokeless tobacco: Never Used  . Alcohol Use: No  . Drug Use: No  . Sexual Activity: Not Asked   Other Topics Concern  . None   Social History Narrative     Review of Systems  Constitutional: Negative for fever, chills, appetite change, fatigue and unexpected weight change.  Eyes: Negative for visual disturbance.  Respiratory: Negative for cough and shortness of breath.   Cardiovascular: Negative for chest pain and leg swelling.  Gastrointestinal: Negative for nausea, vomiting, abdominal pain, diarrhea and constipation.  Musculoskeletal: Positive for myalgias, back pain and arthralgias.  Skin: Negative for color change and rash.  Hematological: Negative for adenopathy. Does not bruise/bleed easily.  Psychiatric/Behavioral: Negative for suicidal ideas, sleep disturbance and dysphoric mood. The patient is not nervous/anxious.        Objective:    BP 134/100 mmHg  Pulse 89  Ht 5\' 7"  (1.702 m)  Wt 189 lb 12.8 oz (86.093 kg)  BMI 29.72 kg/m2  SpO2 96% Physical Exam  Constitutional: She is oriented to person, place, and time. She appears well-developed and well-nourished. No distress.  HENT:  Head: Normocephalic and atraumatic.  Right Ear: External ear normal.  Left Ear: External ear normal.  Nose: Nose normal.  Mouth/Throat: Oropharynx is clear and moist. No oropharyngeal exudate.  Eyes: Conjunctivae are normal. Pupils are equal, round, and reactive to light. Right eye exhibits no discharge. Left eye exhibits no discharge. No scleral icterus.  Neck: Normal range of motion. Neck supple. No tracheal deviation present. No  thyromegaly present.  Cardiovascular: Normal rate, regular rhythm, normal heart sounds and intact distal pulses.  Exam reveals no gallop and no friction rub.   No murmur heard. Pulmonary/Chest: Effort normal and breath sounds normal. No respiratory distress. She has no wheezes. She has no rales. She exhibits no tenderness.  Musculoskeletal: Normal range of motion. She exhibits no edema or tenderness.       Lumbar back: She exhibits pain.  Lymphadenopathy:    She has no cervical adenopathy.  Neurological: She is alert  and oriented to person, place, and time. No cranial nerve deficit. She exhibits normal muscle tone. Coordination normal.  Skin: Skin is warm and dry. No rash noted. She is not diaphoretic. No erythema. No pallor.  Psychiatric: She has a normal mood and affect. Her behavior is normal. Judgment and thought content normal.          Assessment & Plan:   Problem List Items Addressed This Visit      Unprioritized   Chronic pain (Chronic)    Pain symptoms improved with recent Prednisone taper. Reviewed MRI lumbar spine with pt today. Encouraged follow up with Vance Peper at [facility].      Depression - Primary    Symptoms improved with Fluoxetine alone. Will continue.      Relevant Medications   ALPRAZolam (XANAX) 0.25 MG tablet   Hypertension    BP Readings from Last 3 Encounters:  11/19/15 134/100  10/05/15 136/92  07/06/15 127/86   BP elevated today. Possibly exacerbated by recent Prednisone taper. Will monitor. She will plan to follow up with new PCP in next few weeks with recheck of BP. Continue Losartan.      Overweight (BMI 25.0-29.9)    Wt Readings from Last 3 Encounters:  11/19/15 189 lb 12.8 oz (86.093 kg)  10/05/15 187 lb 9.6 oz (85.095 kg)  07/06/15 185 lb 8 oz (84.142 kg)   She requests phentermine. Discussed that this medication would not be safe in setting of elevated BP. Encouraged her to limit carbohydrates to 100mg  daily.      Screening for breast cancer   Relevant Orders   MM Digital Screening       Return if symptoms worsen or fail to improve.  Ronette Deter, MD Internal Medicine [facility]

## 2015-11-19 NOTE — Assessment & Plan Note (Signed)
Pain symptoms improved with recent Prednisone taper. Reviewed MRI lumbar spine with pt today. Encouraged follow up with Vance Peper at [facility].

## 2015-11-19 NOTE — Assessment & Plan Note (Signed)
Symptoms improved with Fluoxetine alone. Will continue.

## 2015-11-19 NOTE — Assessment & Plan Note (Signed)
Wt Readings from Last 3 Encounters:  11/19/15 189 lb 12.8 oz (86.093 kg)  10/05/15 187 lb 9.6 oz (85.095 kg)  07/06/15 185 lb 8 oz (84.142 kg)   She requests phentermine. Discussed that this medication would not be safe in setting of elevated BP. Encouraged her to limit carbohydrates to 100mg  daily.

## 2015-11-19 NOTE — Patient Instructions (Signed)
Try limiting carbohydrates to 100gm daily.

## 2015-11-22 ENCOUNTER — Encounter: Payer: BLUE CROSS/BLUE SHIELD | Admitting: Physical Therapy

## 2015-11-25 ENCOUNTER — Encounter: Payer: Self-pay | Admitting: Internal Medicine

## 2015-11-25 ENCOUNTER — Encounter: Payer: BLUE CROSS/BLUE SHIELD | Admitting: Physical Therapy

## 2015-11-29 ENCOUNTER — Encounter: Payer: BLUE CROSS/BLUE SHIELD | Admitting: Physical Therapy

## 2015-11-30 NOTE — Telephone Encounter (Signed)
No they do not. They are primary care so I don't know what they mean. She may want to try [facility].

## 2015-12-02 ENCOUNTER — Encounter: Payer: BLUE CROSS/BLUE SHIELD | Admitting: Physical Therapy

## 2015-12-06 ENCOUNTER — Other Ambulatory Visit: Payer: Self-pay | Admitting: Internal Medicine

## 2015-12-22 DIAGNOSIS — K1329 Other disturbances of oral epithelium, including tongue: Secondary | ICD-10-CM | POA: Diagnosis not present

## 2015-12-22 DIAGNOSIS — D3709 Neoplasm of uncertain behavior of other specified sites of the oral cavity: Secondary | ICD-10-CM | POA: Diagnosis not present

## 2015-12-22 DIAGNOSIS — J038 Acute tonsillitis due to other specified organisms: Secondary | ICD-10-CM | POA: Diagnosis not present

## 2015-12-22 DIAGNOSIS — H903 Sensorineural hearing loss, bilateral: Secondary | ICD-10-CM | POA: Diagnosis not present

## 2016-01-14 DIAGNOSIS — E890 Postprocedural hypothyroidism: Secondary | ICD-10-CM | POA: Diagnosis not present

## 2016-01-21 DIAGNOSIS — E890 Postprocedural hypothyroidism: Secondary | ICD-10-CM | POA: Diagnosis not present

## 2016-01-24 ENCOUNTER — Other Ambulatory Visit: Payer: Self-pay | Admitting: Internal Medicine

## 2016-01-24 ENCOUNTER — Ambulatory Visit
Admission: RE | Admit: 2016-01-24 | Discharge: 2016-01-24 | Disposition: A | Discharge status: Home / Self Care | Payer: Medicare Other | Source: Ambulatory Visit | Attending: Internal Medicine | Admitting: Internal Medicine

## 2016-01-24 DIAGNOSIS — Z1231 Encounter for screening mammogram for malignant neoplasm of breast: Secondary | ICD-10-CM

## 2016-01-24 DIAGNOSIS — Z1239 Encounter for other screening for malignant neoplasm of breast: Secondary | ICD-10-CM

## 2016-02-29 DIAGNOSIS — Z9884 Bariatric surgery status: Secondary | ICD-10-CM | POA: Diagnosis not present

## 2016-02-29 DIAGNOSIS — I10 Essential (primary) hypertension: Secondary | ICD-10-CM | POA: Diagnosis not present

## 2016-02-29 DIAGNOSIS — E890 Postprocedural hypothyroidism: Secondary | ICD-10-CM | POA: Diagnosis not present

## 2016-02-29 DIAGNOSIS — K219 Gastro-esophageal reflux disease without esophagitis: Secondary | ICD-10-CM | POA: Diagnosis not present

## 2016-02-29 DIAGNOSIS — Z13220 Encounter for screening for lipoid disorders: Secondary | ICD-10-CM | POA: Diagnosis not present

## 2016-02-29 DIAGNOSIS — Z23 Encounter for immunization: Secondary | ICD-10-CM | POA: Diagnosis not present

## 2016-02-29 DIAGNOSIS — Z79899 Other long term (current) drug therapy: Secondary | ICD-10-CM | POA: Diagnosis not present

## 2016-02-29 DIAGNOSIS — F3341 Major depressive disorder, recurrent, in partial remission: Secondary | ICD-10-CM | POA: Diagnosis not present

## 2016-03-07 DIAGNOSIS — R011 Cardiac murmur, unspecified: Secondary | ICD-10-CM | POA: Diagnosis not present

## 2016-03-21 DIAGNOSIS — M810 Age-related osteoporosis without current pathological fracture: Secondary | ICD-10-CM | POA: Diagnosis not present

## 2016-03-22 ENCOUNTER — Telehealth: Payer: Self-pay

## 2016-03-24 NOTE — Telephone Encounter (Signed)
error 

## 2016-04-06 DIAGNOSIS — M150 Primary generalized (osteo)arthritis: Secondary | ICD-10-CM | POA: Diagnosis not present

## 2016-04-06 DIAGNOSIS — M79642 Pain in left hand: Secondary | ICD-10-CM | POA: Diagnosis not present

## 2016-04-06 DIAGNOSIS — M79641 Pain in right hand: Secondary | ICD-10-CM | POA: Diagnosis not present

## 2016-04-12 ENCOUNTER — Ambulatory Visit: Payer: Medicare Other | Attending: Rheumatology | Admitting: Occupational Therapy

## 2016-04-12 DIAGNOSIS — M79641 Pain in right hand: Secondary | ICD-10-CM | POA: Diagnosis not present

## 2016-04-12 DIAGNOSIS — M6281 Muscle weakness (generalized): Secondary | ICD-10-CM

## 2016-04-12 DIAGNOSIS — M79642 Pain in left hand: Secondary | ICD-10-CM | POA: Diagnosis not present

## 2016-04-12 NOTE — Patient Instructions (Signed)
Moist heat in evening and am  In am after heat  Tendon glides AROM  Opposition   Reviewed joint protection and AE  As well as modification using hands in kitchen and cooking  Hand out provided

## 2016-04-12 NOTE — Therapy (Signed)
[facility] [address] [place], [address] Phone: [phone]   Fax:  [phone]  Occupational Therapy Evaluation  Patient Details  Name: Barbara Mclaughlin MRN: [id number] Date of Birth: December 17, 1950 Referring Provider: Jefm Bryant  Encounter Date: 04/12/2016      OT End of Session - 04/12/16 0857    Visit Number 1   Number of Visits 3   Date for OT Re-Evaluation 05/03/16   OT Start Time 0805   OT Stop Time 0856   OT Time Calculation (min) 51 min   Activity Tolerance Patient tolerated treatment well   Behavior During Therapy [facility] for tasks assessed/performed      Past Medical History:  Diagnosis Date  . Acoustic neuroma (HCC)    right ear  . Anemia   . Arthritis   . Basal cell carcinoma, eyelid   . Depression   . Dysrhythmia 0ne episode of tachycardia on 10/03/14  . GERD (gastroesophageal reflux disease)   . Headache   . Heart murmur bruit  . Hypertension   . Hyperthyroidism 01/05/2015  . Osteoporosis   . Thyroid nodule     Past Surgical History:  Procedure Laterality Date  . BREAST REDUCTION SURGERY  1990  . EYE SURGERY Bilateral cataract removal  . GASTRIC BYPASS    . LAPAROSCOPIC HYSTERECTOMY  07/2009  . REDUCTION MAMMAPLASTY Bilateral 20+ yrs ago  . TOTAL KNEE ARTHROPLASTY  2013   Dr. Marry Guan, left  . TOTAL KNEE ARTHROPLASTY Right 06/2013    There were no vitals filed for this visit.      Subjective Assessment - 04/12/16 0808    Subjective  Bilateral hand pain - R worse than L - gradually worse - night worse and cold weather - grip weak both -    Patient Stated Goals Less pain and increase grip    Currently in Pain? Yes   Pain Score 0-No pain           [facility] OT Assessment - 04/12/16 0001      Assessment   Diagnosis Bilateral hand pain    Referring Provider kernodle   Onset Date 12/07/15     Home  Environment   Lives With Spouse     Prior Function   Vocation Retired    Leisure R hand dominant - on phone , cook or bakc - grand kids , travelling , shopping      Edema   Edema 3rd PIP R 6.8; 6 cm on L      Strength   Right Hand Grip (lbs) 45   Right Hand Lateral Pinch 9 lbs   Right Hand 3 Point Pinch 9 lbs   Left Hand Grip (lbs) 39   Left Hand Lateral Pinch 9 lbs   Left Hand 3 Point Pinch 8 lbs     Right Hand AROM   R Index  MCP 0-90 95 Degrees   R Index PIP 0-100 100 Degrees   R Long  MCP 0-90 98 Degrees   R Long PIP 0-100 100 Degrees   R Ring  MCP 0-90 96 Degrees   R Ring PIP 0-100 100 Degrees   R Little  MCP 0-90 100 Degrees   R Little PIP 0-100 85 Degrees     Left Hand AROM   L Index  MCP 0-90 90 Degrees   L Index PIP 0-100 105 Degrees   L Long  MCP 0-90 95 Degrees   L Long PIP  0-100 105 Degrees   L Ring  MCP 0-90 90 Degrees   L Ring PIP 0-100 105 Degrees   L Little  MCP 0-90 95 Degrees   L Little PIP 0-100 105 Degrees     PT ed on home program   Moist heat in evening and am  In am after heat  Tendon glides AROM  Opposition   Reviewed joint protection and AE  As well as modification using hands in kitchen and cooking  Hand outs provided                    OT Education - 04/12/16 0857    Education provided Yes   Education Details HEP and jointprotection /AE    Person(s) Educated Patient   Methods Explanation;Demonstration;Tactile cues;Verbal cues;Handout   Comprehension Verbal cues required;Returned demonstration;Verbalized understanding          OT Short Term Goals - 04/12/16 1057      OT SHORT TERM GOAL #1   Title Pt report decrease pain  or less intense in the evening with implementing some of the joint protection principles    Baseline pain at the worse 6/10 and has 50% of time pain    Time 2   Period Weeks   Status New           OT Long Term Goals - 04/12/16 1058      OT LONG TERM GOAL #1   Title Pt  verbalize 3 joint protection principles and /or AE to use to increase use of R and L  hand in ADL's and IADL's without increase pain    Baseline no knowledge    Time 3   Period Weeks   Status New     OT LONG TERM GOAL #2   Title Lat and 3 point grip increase by 1-2 lbs to be in range for her age    Baseline lat and 3 point 9 lbs L and R    Time 3   Period Weeks   Status New               Plan - 04/12/16 [id number]    Clinical Impression Statement Pt present with diagnosis of OA with  bilateral hand pain - pt show AROM WNL at all joints except R 5th PIP, thumb CMC- and compensation out of MP -increase edema and pain at 3rd PIP on R -  grip strength is in range for her age - but  lateral and 3 point grip is below range - pt to show increase pain with  those at thumb and hyper extention at MP with flexion at IP  during loading - pt was ed on joint protection , HEP and adaptive equipment - to work on for week and return   Rehab Potential Good   Clinical Impairments Affecting Rehab Potential chronic condition    OT Frequency 1x / week   OT Duration 2 weeks   OT Treatment/Interventions Self-care/ADL training;Splinting;Patient/family education;Therapeutic exercises;Parrafin;Manual Therapy   Plan Reassess lateral and 3 point grip - and review joint protection    OT Home Exercise Plan see pt instruction    Consulted and Agree with Plan of Care Patient      Patient will benefit from skilled therapeutic intervention in order to improve the following deficits and impairments:  Pain, Impaired UE functional use, Decreased strength, Decreased knowledge of use of DME, Decreased knowledge of precautions  Visit Diagnosis: Pain in left hand - Plan: Ot  plan of care cert/re-cert  Pain in right hand - Plan: Ot plan of care cert/re-cert  Muscle weakness (generalized) - Plan: Ot plan of care cert/re-cert    Problem List Patient Active Problem List   Diagnosis Date Noted  . Schwannoma 10/05/2015  . Memory change 07/06/2015  . Insomnia 06/04/2015  . Chronic fatigue 06/04/2015  .  Hyperthyroidism 01/05/2015  . Atrophic vaginitis 01/05/2015  . SVT (supraventricular tachycardia) ([place]) 10/08/2014  . Thyroid nodule 08/11/2014  . Basal cell carcinoma of left eyelid 08/11/2014  . Anemia, iron deficiency 04/09/2014  . Dysphagia, pharyngoesophageal phase 04/29/2013  . Routine general medical examination at a health care facility 01/13/2013  . Chronic pain 11/20/2011  . Screening for breast cancer 11/20/2011  . Arthritis 04/17/2011  . Hypertension 04/17/2011  . Depression 04/17/2011  . GERD (gastroesophageal reflux disease) 04/17/2011  . Menopausal and perimenopausal disorder 04/17/2011  . Overweight (BMI 25.0-29.9) 04/17/2011  . Pre-operative cardiovascular examination 09/19/2010  . Abnormal EKG 09/19/2010    Rosalyn Gess OTR/L,CLT 04/12/2016, 11:03 AM  [facility] [address], [place], [address] Phone: [phone]   Fax:  [phone]  Name: Barbara Mclaughlin MRN: [id number] Date of Birth: 24-Dec-1950

## 2016-04-14 DIAGNOSIS — Z9884 Bariatric surgery status: Secondary | ICD-10-CM | POA: Diagnosis not present

## 2016-04-14 DIAGNOSIS — Z79899 Other long term (current) drug therapy: Secondary | ICD-10-CM | POA: Diagnosis not present

## 2016-04-14 DIAGNOSIS — E890 Postprocedural hypothyroidism: Secondary | ICD-10-CM | POA: Diagnosis not present

## 2016-04-14 DIAGNOSIS — Z13220 Encounter for screening for lipoid disorders: Secondary | ICD-10-CM | POA: Diagnosis not present

## 2016-04-14 DIAGNOSIS — Z131 Encounter for screening for diabetes mellitus: Secondary | ICD-10-CM | POA: Diagnosis not present

## 2016-04-14 DIAGNOSIS — I10 Essential (primary) hypertension: Secondary | ICD-10-CM | POA: Diagnosis not present

## 2016-04-20 ENCOUNTER — Ambulatory Visit: Payer: Medicare Other | Admitting: Occupational Therapy

## 2016-04-21 DIAGNOSIS — E042 Nontoxic multinodular goiter: Secondary | ICD-10-CM | POA: Diagnosis not present

## 2016-04-21 DIAGNOSIS — E890 Postprocedural hypothyroidism: Secondary | ICD-10-CM | POA: Diagnosis not present

## 2016-06-12 DIAGNOSIS — K219 Gastro-esophageal reflux disease without esophagitis: Secondary | ICD-10-CM | POA: Diagnosis not present

## 2016-06-12 DIAGNOSIS — Z79899 Other long term (current) drug therapy: Secondary | ICD-10-CM | POA: Diagnosis not present

## 2016-06-12 DIAGNOSIS — I10 Essential (primary) hypertension: Secondary | ICD-10-CM | POA: Diagnosis not present

## 2016-06-12 DIAGNOSIS — F3341 Major depressive disorder, recurrent, in partial remission: Secondary | ICD-10-CM | POA: Diagnosis not present

## 2016-06-12 DIAGNOSIS — M159 Polyosteoarthritis, unspecified: Secondary | ICD-10-CM | POA: Diagnosis not present

## 2016-06-12 DIAGNOSIS — E559 Vitamin D deficiency, unspecified: Secondary | ICD-10-CM | POA: Diagnosis not present

## 2016-06-27 DIAGNOSIS — D1800 Hemangioma unspecified site: Secondary | ICD-10-CM | POA: Diagnosis not present

## 2016-06-27 DIAGNOSIS — Z86018 Personal history of other benign neoplasm: Secondary | ICD-10-CM | POA: Diagnosis not present

## 2016-07-21 DIAGNOSIS — R52 Pain, unspecified: Secondary | ICD-10-CM | POA: Diagnosis not present

## 2016-07-26 DIAGNOSIS — E039 Hypothyroidism, unspecified: Secondary | ICD-10-CM | POA: Diagnosis not present

## 2016-07-27 DIAGNOSIS — M25552 Pain in left hip: Secondary | ICD-10-CM | POA: Diagnosis not present

## 2016-07-27 DIAGNOSIS — M25562 Pain in left knee: Secondary | ICD-10-CM | POA: Diagnosis not present

## 2016-07-27 DIAGNOSIS — M7062 Trochanteric bursitis, left hip: Secondary | ICD-10-CM | POA: Diagnosis not present

## 2016-08-01 DIAGNOSIS — J209 Acute bronchitis, unspecified: Secondary | ICD-10-CM | POA: Diagnosis not present

## 2016-08-01 DIAGNOSIS — D333 Benign neoplasm of cranial nerves: Secondary | ICD-10-CM | POA: Diagnosis not present

## 2016-08-14 DIAGNOSIS — Z961 Presence of intraocular lens: Secondary | ICD-10-CM | POA: Diagnosis not present

## 2016-08-28 DIAGNOSIS — K912 Postsurgical malabsorption, not elsewhere classified: Secondary | ICD-10-CM | POA: Diagnosis not present

## 2016-08-28 DIAGNOSIS — E890 Postprocedural hypothyroidism: Secondary | ICD-10-CM | POA: Diagnosis not present

## 2016-08-28 DIAGNOSIS — Z9884 Bariatric surgery status: Secondary | ICD-10-CM | POA: Diagnosis not present

## 2016-09-12 DIAGNOSIS — M7062 Trochanteric bursitis, left hip: Secondary | ICD-10-CM | POA: Diagnosis not present

## 2016-10-13 DIAGNOSIS — E890 Postprocedural hypothyroidism: Secondary | ICD-10-CM | POA: Diagnosis not present

## 2016-10-13 DIAGNOSIS — Z79899 Other long term (current) drug therapy: Secondary | ICD-10-CM | POA: Diagnosis not present

## 2016-10-13 DIAGNOSIS — E559 Vitamin D deficiency, unspecified: Secondary | ICD-10-CM | POA: Diagnosis not present

## 2016-10-20 ENCOUNTER — Other Ambulatory Visit: Payer: Self-pay | Admitting: Internal Medicine

## 2016-10-20 DIAGNOSIS — E890 Postprocedural hypothyroidism: Secondary | ICD-10-CM | POA: Diagnosis not present

## 2016-10-20 DIAGNOSIS — E042 Nontoxic multinodular goiter: Secondary | ICD-10-CM | POA: Diagnosis not present

## 2016-10-20 DIAGNOSIS — I471 Supraventricular tachycardia: Secondary | ICD-10-CM | POA: Diagnosis not present

## 2016-10-20 DIAGNOSIS — I10 Essential (primary) hypertension: Secondary | ICD-10-CM | POA: Diagnosis not present

## 2016-10-20 DIAGNOSIS — R1031 Right lower quadrant pain: Secondary | ICD-10-CM

## 2016-10-20 DIAGNOSIS — Z9884 Bariatric surgery status: Secondary | ICD-10-CM | POA: Diagnosis not present

## 2016-10-20 DIAGNOSIS — K912 Postsurgical malabsorption, not elsewhere classified: Secondary | ICD-10-CM | POA: Diagnosis not present

## 2016-10-20 DIAGNOSIS — R194 Change in bowel habit: Secondary | ICD-10-CM | POA: Diagnosis not present

## 2016-10-26 ENCOUNTER — Ambulatory Visit
Admission: RE | Admit: 2016-10-26 | Discharge: 2016-10-26 | Disposition: A | Discharge status: Home / Self Care | Payer: Medicare Other | Source: Ambulatory Visit | Attending: Internal Medicine | Admitting: Internal Medicine

## 2016-10-26 DIAGNOSIS — K449 Diaphragmatic hernia without obstruction or gangrene: Secondary | ICD-10-CM | POA: Diagnosis not present

## 2016-10-26 DIAGNOSIS — K862 Cyst of pancreas: Secondary | ICD-10-CM | POA: Diagnosis not present

## 2016-10-26 DIAGNOSIS — R1031 Right lower quadrant pain: Secondary | ICD-10-CM

## 2016-10-26 MED ORDER — IOPAMIDOL (ISOVUE-300) INJECTION 61%
100.0000 mL | Freq: Once | INTRAVENOUS | Status: AC | PRN
  Administered 2016-10-26: 100 mL via INTRAVENOUS

## 2016-10-27 ENCOUNTER — Other Ambulatory Visit: Payer: Self-pay | Admitting: Internal Medicine

## 2016-10-27 DIAGNOSIS — K862 Cyst of pancreas: Secondary | ICD-10-CM

## 2016-10-30 ENCOUNTER — Emergency Department
Admission: EM | Admit: 2016-10-30 | Discharge: 2016-10-30 | Disposition: A | Discharge status: Home / Self Care | Payer: Medicare Other | Attending: Emergency Medicine | Admitting: Emergency Medicine

## 2016-10-30 DIAGNOSIS — I10 Essential (primary) hypertension: Secondary | ICD-10-CM | POA: Insufficient documentation

## 2016-10-30 DIAGNOSIS — I471 Supraventricular tachycardia: Secondary | ICD-10-CM | POA: Diagnosis not present

## 2016-10-30 DIAGNOSIS — Z791 Long term (current) use of non-steroidal anti-inflammatories (NSAID): Secondary | ICD-10-CM | POA: Diagnosis not present

## 2016-10-30 DIAGNOSIS — R000 Tachycardia, unspecified: Secondary | ICD-10-CM | POA: Diagnosis present

## 2016-10-30 DIAGNOSIS — Z79899 Other long term (current) drug therapy: Secondary | ICD-10-CM | POA: Diagnosis not present

## 2016-10-30 LAB — MAGNESIUM: Magnesium: 2.2 mg/dL (ref 1.7–2.4)

## 2016-10-30 LAB — HEPATIC FUNCTION PANEL
ALT: 21 U/L (ref 14–54)
AST: 29 U/L (ref 15–41)
Albumin: 4.5 g/dL (ref 3.5–5.0)
Alkaline Phosphatase: 62 U/L (ref 38–126)
Bilirubin, Direct: <0.1 mg/dL — ABNORMAL LOW (ref 0.1–0.5)
TOTAL PROTEIN: 7.5 g/dL (ref 6.5–8.1)
Total Bilirubin: 0.3 mg/dL (ref 0.3–1.2)

## 2016-10-30 LAB — CBC
HCT: 37.7 % (ref 35.0–47.0)
HEMOGLOBIN: 13.2 g/dL (ref 12.0–16.0)
MCH: 31.9 pg (ref 26.0–34.0)
MCHC: 35 g/dL (ref 32.0–36.0)
MCV: 91 fL (ref 80.0–100.0)
Platelets: 374 10*3/uL (ref 150–440)
RBC: 4.14 MIL/uL (ref 3.80–5.20)
RDW: 13.2 % (ref 11.5–14.5)
WBC: 6.4 10*3/uL (ref 3.6–11.0)

## 2016-10-30 LAB — BASIC METABOLIC PANEL
ANION GAP: 8 (ref 5–15)
BUN: 12 mg/dL (ref 6–20)
CALCIUM: 9.2 mg/dL (ref 8.9–10.3)
CO2: 24 mmol/L (ref 22–32)
Chloride: 102 mmol/L (ref 101–111)
Creatinine, Ser: 0.71 mg/dL (ref 0.44–1.00)
Glucose, Bld: 108 mg/dL — ABNORMAL HIGH (ref 65–99)
POTASSIUM: 3.8 mmol/L (ref 3.5–5.1)
SODIUM: 134 mmol/L — AB (ref 135–145)

## 2016-10-30 LAB — TROPONIN I

## 2016-10-30 MED ORDER — SODIUM CHLORIDE 0.9 % IV BOLUS (SEPSIS)
1000.0000 mL | Freq: Once | INTRAVENOUS | Status: AC
  Administered 2016-10-30: 1000 mL via INTRAVENOUS

## 2016-10-30 MED ORDER — ADENOSINE 6 MG/2ML IV SOLN
6.0000 mg | Freq: Once | INTRAVENOUS | Status: AC
  Administered 2016-10-30: 6 mg via INTRAVENOUS

## 2016-10-30 NOTE — ED Triage Notes (Signed)
States approx 30 min ago while getting out of shower, felt dizzy and noticed rapid heart rate.

## 2016-10-30 NOTE — ED Notes (Signed)
Medication administered per MD order. Pt converted to NSR 84 bpm. Pt tolerated well. Pt currently A&O x4, states she feels better.

## 2016-10-30 NOTE — ED Notes (Addendum)
Pt arrives to ed via [facility] with reports that her heart rate is 180. Pt states this has happened before.

## 2016-10-30 NOTE — ED Notes (Signed)
This RN to bedside at this time. NAD noted at this time. Pt resting in bed with husband. Pt remains in NSR on the monitor. Pt denies any needs. This RN apologized for delay in coming back to room due to sick patient. Pt states understanding. Requested to know if MD ordered chest X-ray, informed patient would ask MD.

## 2016-10-30 NOTE — Discharge Instructions (Signed)
Please return to the emergency department for any new or worsening symptoms such as if her palpitations return, if he developed chest pain, if you pass out, or for any other concerns. Please make an appointment to follow up with cardiology as needed for recheck.  It was a pleasure to take care of you today, and thank you for coming to our emergency department.  If you have any questions or concerns before leaving please ask the nurse to grab me and I'm more than happy to go through your aftercare instructions again.  If you were prescribed any opioid pain medication today such as Norco, Vicodin, Percocet, morphine, hydrocodone, or oxycodone please make sure you do not drive when you are taking this medication as it can alter your ability to drive safely.  If you have any concerns once you are home that you are not improving or are in fact getting worse before you can make it to your follow-up appointment, please do not hesitate to call 911 and come back for further evaluation.  Darel Hong MD  Results for orders placed or performed during the hospital encounter of 47/82/95  Basic metabolic panel  Result Value Ref Range   Sodium 134 (L) 135 - 145 mmol/L   Potassium 3.8 3.5 - 5.1 mmol/L   Chloride 102 101 - 111 mmol/L   CO2 24 22 - 32 mmol/L   Glucose, Bld 108 (H) 65 - 99 mg/dL   BUN 12 6 - 20 mg/dL   Creatinine, Ser 0.71 0.44 - 1.00 mg/dL   Calcium 9.2 8.9 - 10.3 mg/dL   GFR calc non Af Amer >60 >60 mL/min   GFR calc Af Amer >60 >60 mL/min   Anion gap 8 5 - 15  Hepatic function panel  Result Value Ref Range   Total Protein 7.5 6.5 - 8.1 g/dL   Albumin 4.5 3.5 - 5.0 g/dL   AST 29 15 - 41 U/L   ALT 21 14 - 54 U/L   Alkaline Phosphatase 62 38 - 126 U/L   Total Bilirubin 0.3 0.3 - 1.2 mg/dL   Bilirubin, Direct <0.1 (L) 0.1 - 0.5 mg/dL   Indirect Bilirubin NOT CALCULATED 0.3 - 0.9 mg/dL  Magnesium  Result Value Ref Range   Magnesium 2.2 1.7 - 2.4 mg/dL  CBC  Result Value Ref Range     WBC 6.4 3.6 - 11.0 K/uL   RBC 4.14 3.80 - 5.20 MIL/uL   Hemoglobin 13.2 12.0 - 16.0 g/dL   HCT 37.7 35.0 - 47.0 %   MCV 91.0 80.0 - 100.0 fL   MCH 31.9 26.0 - 34.0 pg   MCHC 35.0 32.0 - 36.0 g/dL   RDW 13.2 11.5 - 14.5 %   Platelets 374 150 - 440 K/uL  Troponin I  Result Value Ref Range   Troponin I <0.03 <0.03 ng/mL   Ct Abdomen Pelvis W Contrast  Result Date: 10/26/2016 CLINICAL DATA:  Right lower quadrant pain. EXAM: CT ABDOMEN AND PELVIS WITH CONTRAST TECHNIQUE: Multidetector CT imaging of the abdomen and pelvis was performed using the standard protocol following bolus administration of intravenous contrast. CONTRAST:  167mL ISOVUE-300 IOPAMIDOL (ISOVUE-300) INJECTION 61% COMPARISON:  10/09/2008 FINDINGS: Lower chest: Small to moderate-sized hiatal hernia. Heart is normal size. Scarring in the right lung base. No effusions. Hepatobiliary: No focal hepatic abnormality. Gallbladder unremarkable. Pancreas: 1.6 cm low-density cystic lesion in the lower pancreatic head. Remainder the pancreas is unremarkable. No ductal dilatation. Spleen: No focal abnormality.  Normal size.  Adrenals/Urinary Tract: Small cyst in the midpole of the left kidney. Kidneys otherwise unremarkable. No hydronephrosis. Adrenal glands and urinary bladder unremarkable. Stomach/Bowel: Stomach, large and small bowel grossly unremarkable. Appendix not visualized, but no secondary signs of appendicitis in the right lower quadrant. Vascular/Lymphatic: Scattered aortic calcifications. No evidence of aneurysm or adenopathy. Reproductive: Prior hysterectomy.  No adnexal masses. Other: No free fluid or free air. Musculoskeletal: No acute bony abnormality. IMPRESSION: No acute findings in the abdomen or pelvis. Appendix not visualized, but no secondary signs of appendicitis. 1.6 cm cystic area in the inferior pancreatic head, new since 2010. Cannot exclude cystic pancreatic neoplasm. Recommend further evaluation with pancreatic protocol  MRI. Small to moderate hiatal hernia. Electronically Signed   By: Rolm Baptise M.D.   On: 10/26/2016 09:29

## 2016-10-30 NOTE — ED Notes (Signed)
Pt's husband to desk to ask for blanket for patient. Pt given warm blanket, remains in NSR on the monitor.

## 2016-10-30 NOTE — ED Notes (Signed)
NAD noted at time of d/c. Pt denies any comments/concerns regarding D/C at this time. Pt taken to lobby via wheelchair.

## 2016-10-30 NOTE — ED Provider Notes (Signed)
[facility] Emergency Department Provider Note  ____________________________________________   First MD Initiated Contact with Patient 10/30/16 0945     (approximate)  I have reviewed the triage vital signs and the nursing notes.   HISTORY  Chief Complaint Tachycardia  HPI Barbara Mclaughlin is a 66 y.o. female who comes to the emergency department with 40 minutes of acute palpitations shortness of breath and moderate severity nonradiating chest discomfort. She has a previous medical history of 1 episode of arrhythmia but is never had a heart attack. She's never had a pulmonary embolism. She denies fevers or chills. She denies antecedent trauma. She has no recent leg swelling or immobilization. She takes no AV nodal blockers.Her symptoms began suddenly. Nothing has made them better or worse.   Past Medical History:  Diagnosis Date  . Acoustic neuroma (HCC)    right ear  . Anemia   . Arthritis   . Basal cell carcinoma, eyelid   . Depression   . Dysrhythmia 0ne episode of tachycardia on 10/03/14  . GERD (gastroesophageal reflux disease)   . Headache   . Heart murmur bruit  . Hypertension   . Hyperthyroidism 01/05/2015  . Osteoporosis   . Thyroid nodule     Patient Active Problem List   Diagnosis Date Noted  . Schwannoma 10/05/2015  . Memory change 07/06/2015  . Insomnia 06/04/2015  . Chronic fatigue 06/04/2015  . Hyperthyroidism 01/05/2015  . Atrophic vaginitis 01/05/2015  . SVT (supraventricular tachycardia) ([place]) 10/08/2014  . Thyroid nodule 08/11/2014  . Basal cell carcinoma of left eyelid 08/11/2014  . Anemia, iron deficiency 04/09/2014  . Dysphagia, pharyngoesophageal phase 04/29/2013  . Routine general medical examination at a health care facility 01/13/2013  . Chronic pain 11/20/2011  . Screening for breast cancer 11/20/2011  . Arthritis 04/17/2011  . Hypertension 04/17/2011  . Depression 04/17/2011  . GERD (gastroesophageal  reflux disease) 04/17/2011  . Menopausal and perimenopausal disorder 04/17/2011  . Overweight (BMI 25.0-29.9) 04/17/2011  . Pre-operative cardiovascular examination 09/19/2010  . Abnormal EKG 09/19/2010    Past Surgical History:  Procedure Laterality Date  . BREAST REDUCTION SURGERY  1990  . EYE SURGERY Bilateral cataract removal  . GASTRIC BYPASS    . LAPAROSCOPIC HYSTERECTOMY  07/2009  . REDUCTION MAMMAPLASTY Bilateral 20+ yrs ago  . TOTAL KNEE ARTHROPLASTY  2013   Dr. Marry Guan, left  . TOTAL KNEE ARTHROPLASTY Right 06/2013    Prior to Admission medications   Medication Sig Start Date End Date Taking? Authorizing Provider  ALPRAZolam Duanne Moron) 0.25 MG tablet Take by mouth. 11/17/15   [provider]  bisacodyl (BISACODYL) 5 MG EC tablet Take 5 mg by mouth daily as needed.     [provider]  celecoxib (CELEBREX) 100 MG capsule TAKE ONE CAPSULE BY MOUTH TWICE DAILY 12/06/15   Jackolyn Confer, MD  Cholecalciferol (VITAMIN D3) 2000 UNITS capsule Take 2,000 Units by mouth daily.    [provider]  esomeprazole (NEXIUM) 40 MG capsule Take 80 mg by mouth every morning.    [provider]  fluocinonide ointment (LIDEX) 0.05 % Apply topically 2 (two) times daily. 09/06/12   Jackolyn Confer, MD  FLUoxetine (PROZAC) 20 MG capsule TAKE TWO CAPSULES BY MOUTH IN THE MORNING AND TAKE ONE CAPSULE BY MOUTH IN THE EVENING 12/06/15   Jackolyn Confer, MD  levothyroxine (SYNTHROID, LEVOTHROID) 25 MCG tablet Take by mouth. 09/03/15 09/02/16  [provider]  loratadine (CLARITIN) 10 MG tablet Take  10 mg by mouth daily.    [provider]  losartan (COZAAR) 50 MG tablet TAKE ONE TABLET BY MOUTH ONCE DAILY 06/29/15   Jackolyn Confer, MD  Multiple Vitamin (MULTIVITAMIN) capsule Take 1 capsule by mouth daily.    [provider]  vitamin B-12 (CYANOCOBALAMIN) 1000 MCG tablet Take 1,000 mcg by mouth daily.    [provider]     Allergies Effexor [venlafaxine] and Topamax [topiramate]  Family History  Problem Relation Age of Onset  . Breast cancer Paternal Aunt     Social History Social History  Substance Use Topics  . Smoking status: Never Smoker  . Smokeless tobacco: Never Used  . Alcohol use No    Review of Systems Constitutional: No fever/chills Eyes: No visual changes. ENT: No sore throat. Cardiovascular: Positive chest pain. Respiratory: Positive shortness of breath. Gastrointestinal: No abdominal pain.  No nausea, no vomiting.  No diarrhea.  No constipation. Genitourinary: Negative for dysuria. Musculoskeletal: Negative for back pain. Skin: Negative for rash. Neurological: Negative for headaches, focal weakness or numbness.   ____________________________________________   PHYSICAL EXAM:  VITAL SIGNS: ED Triage Vitals  Enc Vitals Group     BP 10/30/16 0934 (!) 102/51     Pulse Rate 10/30/16 0930 (!) 180     Resp 10/30/16 0930 20     Temp 10/30/16 0930 97.9 F (36.6 C)     Temp Source 10/30/16 0930 Oral     SpO2 10/30/16 0930 97 %     Weight 10/30/16 0932 179 lb (81.2 kg)     Height 10/30/16 0932 5\' 7"  (1.702 m)     Head Circumference --      Peak Flow --      Pain Score --      Pain Loc --      Pain Edu? --      Excl. in [place]? --     Constitutional: Alert and oriented 4 uncomfortable appearing tearful Eyes: PERRL EOMI. Head: Atraumatic. Nose: No congestion/rhinnorhea. Mouth/Throat: No trismus Neck: No stridor.   Cardiovascular: Tachycardic and exquisitely regular rate Respiratory: Normal respiratory effort.  No retractions. Lungs CTAB and moving good air Gastrointestinal: Soft nontender Musculoskeletal: No lower extremity edema   Neurologic:  Normal speech and language. No gross focal neurologic deficits are appreciated. Skin:  Skin is warm, dry and intact. No rash noted. Psychiatric: Mood and affect are normal. Speech and behavior are  normal.    ____________________________________________   DIFFERENTIAL includes but not limited to  SVT, A. fib with RVR, atrial flutter with RVR, ventricular tachycardia, sinus tachycardia, multifocal atrial tachycardia ____________________________________________   LABS (all labs ordered are listed, but only abnormal results are displayed)  Labs Reviewed  BASIC METABOLIC PANEL - Abnormal; Notable for the following:       Result Value   Sodium 134 (*)    Glucose, Bld 108 (*)    All other components within normal limits  HEPATIC FUNCTION PANEL - Abnormal; Notable for the following:    Bilirubin, Direct <0.1 (*)    All other components within normal limits  MAGNESIUM  CBC  TROPONIN I    Labs unremarkable __________________________________________  EKG  ED ECG REPORT I, Darel Hong, the attending physician, personally viewed and interpreted this ECG.  Date: 10/30/2016 EKG Time: 0923 Rate: 180 Rhythm: Supraventricular tachycardia QRS Axis: normal Intervals: normal ST/T Wave abnormalities: normal Narrative Interpretation: Abnormal  ED ECG REPORT I, Darel Hong, the attending physician, personally viewed and interpreted this  ECG.  Date: 10/30/2016 EKG Time: 0950 Rate: 95 Rhythm: normal sinus rhythm QRS Axis: normal Intervals: normal ST/T Wave abnormalities: normal Narrative Interpretation: unremarkable  ____________________________________________  RADIOLOGY   ____________________________________________   PROCEDURES  Procedure(s) performed: yes  I performed chemical cardioversion of the patient's supraventricular tachycardia using 6 mg of adenosine rapidly flushed. Shortly after administration the patient became uncomfortable and had a sinus pause and following the sinus pause she converted back into sinus rhythm  Procedures  Critical Care performed: no  Observation: no ____________________________________________   INITIAL IMPRESSION /  ASSESSMENT AND PLAN / ED COURSE  Pertinent labs & imaging results that were available during my care of the patient were reviewed by me and considered in my medical decision making (see chart for details).  The patient arrives uncomfortable appearing with sudden onset palpitations. EKG shows an exquisitely regular narrow complex tachycardia to 180 which is consistent with supraventricular tachycardia. She had a normal blood pressure was mentating well area to be initially placed her on cardiac pads in case she decompensated. She was given 6 mg of adenosine via large bore proximal IV rapid push which successfully terminated her dysrhythmia. She was then observed over an hour on cardiac monitor with no further events. Blood work is unremarkable. At this point I will refer her to cardiology outpatient for further evaluation but she does not require long-acting nodal blockers. She is discharged home in improved condition.      ____________________________________________   FINAL CLINICAL IMPRESSION(S) / ED DIAGNOSES  Final diagnoses:  SVT (supraventricular tachycardia) ([place])      NEW MEDICATIONS STARTED DURING THIS VISIT:  Discharge Medication List as of 10/30/2016 11:47 AM       Note:  This document was prepared using Dragon voice recognition software and may include unintentional dictation errors.     Darel Hong, MD 10/31/16 1258

## 2016-11-02 ENCOUNTER — Ambulatory Visit
Admission: RE | Admit: 2016-11-02 | Discharge: 2016-11-02 | Disposition: A | Discharge status: Home / Self Care | Payer: Medicare Other | Source: Ambulatory Visit | Attending: Internal Medicine | Admitting: Internal Medicine

## 2016-11-02 DIAGNOSIS — K869 Disease of pancreas, unspecified: Secondary | ICD-10-CM | POA: Diagnosis not present

## 2016-11-02 DIAGNOSIS — R109 Unspecified abdominal pain: Secondary | ICD-10-CM | POA: Diagnosis not present

## 2016-11-02 DIAGNOSIS — K862 Cyst of pancreas: Secondary | ICD-10-CM | POA: Insufficient documentation

## 2016-11-02 DIAGNOSIS — R1084 Generalized abdominal pain: Secondary | ICD-10-CM | POA: Diagnosis not present

## 2016-11-02 DIAGNOSIS — K219 Gastro-esophageal reflux disease without esophagitis: Secondary | ICD-10-CM | POA: Diagnosis not present

## 2016-11-02 DIAGNOSIS — I700 Atherosclerosis of aorta: Secondary | ICD-10-CM | POA: Insufficient documentation

## 2016-11-02 DIAGNOSIS — I471 Supraventricular tachycardia: Secondary | ICD-10-CM | POA: Diagnosis not present

## 2016-11-02 MED ORDER — GADOBENATE DIMEGLUMINE 529 MG/ML IV SOLN
20.0000 mL | Freq: Once | INTRAVENOUS | Status: AC | PRN
  Administered 2016-11-02: 17 mL via INTRAVENOUS

## 2016-11-03 DIAGNOSIS — I471 Supraventricular tachycardia: Secondary | ICD-10-CM | POA: Insufficient documentation

## 2016-11-03 DIAGNOSIS — I10 Essential (primary) hypertension: Secondary | ICD-10-CM | POA: Diagnosis not present

## 2016-11-23 DIAGNOSIS — K862 Cyst of pancreas: Secondary | ICD-10-CM | POA: Diagnosis not present

## 2016-11-23 DIAGNOSIS — R140 Abdominal distension (gaseous): Secondary | ICD-10-CM | POA: Diagnosis not present

## 2016-11-23 DIAGNOSIS — R935 Abnormal findings on diagnostic imaging of other abdominal regions, including retroperitoneum: Secondary | ICD-10-CM | POA: Diagnosis not present

## 2016-12-01 DIAGNOSIS — I471 Supraventricular tachycardia: Secondary | ICD-10-CM | POA: Diagnosis not present

## 2016-12-01 DIAGNOSIS — I10 Essential (primary) hypertension: Secondary | ICD-10-CM | POA: Diagnosis not present

## 2016-12-04 DIAGNOSIS — H26492 Other secondary cataract, left eye: Secondary | ICD-10-CM | POA: Diagnosis not present

## 2016-12-13 DIAGNOSIS — Z1231 Encounter for screening mammogram for malignant neoplasm of breast: Secondary | ICD-10-CM | POA: Diagnosis not present

## 2016-12-13 DIAGNOSIS — E890 Postprocedural hypothyroidism: Secondary | ICD-10-CM | POA: Diagnosis not present

## 2016-12-13 DIAGNOSIS — I10 Essential (primary) hypertension: Secondary | ICD-10-CM | POA: Diagnosis not present

## 2016-12-13 DIAGNOSIS — I471 Supraventricular tachycardia: Secondary | ICD-10-CM | POA: Diagnosis not present

## 2016-12-21 ENCOUNTER — Other Ambulatory Visit: Payer: Self-pay | Admitting: Internal Medicine

## 2016-12-21 DIAGNOSIS — Z1231 Encounter for screening mammogram for malignant neoplasm of breast: Secondary | ICD-10-CM

## 2016-12-29 ENCOUNTER — Other Ambulatory Visit: Payer: Self-pay | Admitting: Otolaryngology

## 2016-12-29 DIAGNOSIS — Z86018 Personal history of other benign neoplasm: Secondary | ICD-10-CM

## 2017-01-04 ENCOUNTER — Ambulatory Visit
Admission: RE | Admit: 2017-01-04 | Discharge: 2017-01-04 | Disposition: A | Discharge status: Home / Self Care | Payer: Medicare Other | Source: Ambulatory Visit | Attending: Otolaryngology | Admitting: Otolaryngology

## 2017-01-04 DIAGNOSIS — R220 Localized swelling, mass and lump, head: Secondary | ICD-10-CM | POA: Diagnosis not present

## 2017-01-04 DIAGNOSIS — Z9841 Cataract extraction status, right eye: Secondary | ICD-10-CM | POA: Insufficient documentation

## 2017-01-04 DIAGNOSIS — Z86018 Personal history of other benign neoplasm: Secondary | ICD-10-CM

## 2017-01-04 DIAGNOSIS — Z9842 Cataract extraction status, left eye: Secondary | ICD-10-CM | POA: Insufficient documentation

## 2017-01-04 DIAGNOSIS — R42 Dizziness and giddiness: Secondary | ICD-10-CM | POA: Diagnosis not present

## 2017-01-04 LAB — POCT I-STAT CREATININE: CREATININE: 0.8 mg/dL (ref 0.44–1.00)

## 2017-01-04 MED ORDER — GADOBENATE DIMEGLUMINE 529 MG/ML IV SOLN
20.0000 mL | Freq: Once | INTRAVENOUS | Status: AC | PRN
  Administered 2017-01-04: 16 mL via INTRAVENOUS

## 2017-03-20 ENCOUNTER — Ambulatory Visit
Admission: RE | Admit: 2017-03-20 | Discharge: 2017-03-20 | Disposition: A | Discharge status: Home / Self Care | Payer: Medicare Other | Source: Ambulatory Visit | Attending: Internal Medicine | Admitting: Internal Medicine

## 2017-03-20 DIAGNOSIS — Z1231 Encounter for screening mammogram for malignant neoplasm of breast: Secondary | ICD-10-CM | POA: Diagnosis not present

## 2017-04-18 DIAGNOSIS — E042 Nontoxic multinodular goiter: Secondary | ICD-10-CM | POA: Diagnosis not present

## 2017-04-18 DIAGNOSIS — E890 Postprocedural hypothyroidism: Secondary | ICD-10-CM | POA: Diagnosis not present

## 2017-04-23 DIAGNOSIS — E890 Postprocedural hypothyroidism: Secondary | ICD-10-CM | POA: Diagnosis not present

## 2017-04-23 DIAGNOSIS — E042 Nontoxic multinodular goiter: Secondary | ICD-10-CM | POA: Diagnosis not present

## 2017-04-26 DIAGNOSIS — E042 Nontoxic multinodular goiter: Secondary | ICD-10-CM | POA: Diagnosis not present

## 2017-09-10 ENCOUNTER — Ambulatory Visit (INDEPENDENT_AMBULATORY_CARE_PROVIDER_SITE_OTHER): Payer: Medicare Other | Admitting: Gastroenterology

## 2017-09-10 ENCOUNTER — Other Ambulatory Visit: Payer: Self-pay

## 2017-09-10 VITALS — BP 137/69 | HR 73 | Ht 66.0 in | Wt 186.5 lb

## 2017-09-10 DIAGNOSIS — G43909 Migraine, unspecified, not intractable, without status migrainosus: Secondary | ICD-10-CM | POA: Insufficient documentation

## 2017-09-10 DIAGNOSIS — R140 Abdominal distension (gaseous): Secondary | ICD-10-CM | POA: Diagnosis not present

## 2017-09-10 DIAGNOSIS — K5901 Slow transit constipation: Secondary | ICD-10-CM | POA: Diagnosis not present

## 2017-09-10 NOTE — Progress Notes (Signed)
Gastroenterology Consultation  Referring Provider:     Idelle Crouch, MD Primary Care Physician:  Idelle Crouch, MD Primary Gastroenterologist:  Dr. Allen Norris     Reason for Consultation:     Constipation and bloating        HPI:   Barbara Mclaughlin is a 67 y.o. y/o female referred for consultation & management of constipation and bloating by Dr. Doy Hutching, Leonie Douglas, MD.  This patient comes in today with a history of bloating and constipation that she states has been going on for the last 6 months.  The patient did have a colonoscopy for change in bowel habits back in 2015 with another practice.  At that time the patient had a normal colonoscopy.  The patient states that she now takes stool softeners and laxatives because of bloating that she reports to be worse in the afternoon.  The patient also reports that she has been having hard stools.  The patient is status post gastric bypass surgery and also reports that she has a lot of burping.  She does chew gum, drink with a straw and has carbonated drinks.  She also reports that her symptoms are worse from a gas point of view when she does chew gum, drink with a straw and have carbonated drinks.  The patient denies any unexplained weight loss fevers chills nausea or vomiting.  The patient does have some dysphasia when she eats rice in particular but also reports that she has some dysphasia when she eats chicken.  The patient had similar dysphasia back in 2015 and her EGD did not show anything except for signs of a gastric bypass surgery.  Past Medical History:  Diagnosis Date  . Acoustic neuroma (HCC)    right ear  . Anemia   . Arthritis   . Basal cell carcinoma, eyelid   . Depression   . Dysrhythmia 0ne episode of tachycardia on 10/03/14  . GERD (gastroesophageal reflux disease)   . Headache   . Heart murmur bruit  . Hypertension   . Hyperthyroidism 01/05/2015  . Osteoporosis   . Postablative hypothyroidism 09/03/2015  . Thyroid  nodule     Past Surgical History:  Procedure Laterality Date  . BREAST REDUCTION SURGERY  1990  . EYE SURGERY Bilateral cataract removal  . GASTRIC BYPASS    . LAPAROSCOPIC HYSTERECTOMY  07/2009  . REDUCTION MAMMAPLASTY Bilateral 20+ yrs ago  . TOTAL KNEE ARTHROPLASTY  2013   Dr. Marry Guan, left  . TOTAL KNEE ARTHROPLASTY Right 06/2013    Prior to Admission medications   Medication Sig Start Date End Date Taking? Authorizing Provider  ALPRAZolam Duanne Moron) 0.25 MG tablet Take by mouth. 11/17/15  Yes [provider]  buPROPion (WELLBUTRIN XL) 300 MG 24 hr tablet  07/09/17  Yes [provider]  carvedilol (COREG) 3.125 MG tablet Take by mouth. 05/14/17 05/14/18 Yes [provider]  celecoxib (CELEBREX) 100 MG capsule TAKE ONE CAPSULE BY MOUTH TWICE DAILY 12/06/15  Yes Jackolyn Confer, MD  Cholecalciferol (VITAMIN D3) 2000 UNITS capsule Take 2,000 Units by mouth daily.   Yes [provider]  docusate sodium (COLACE) 100 MG capsule Take by mouth.   Yes [provider]  esomeprazole (NEXIUM) 40 MG capsule Take 80 mg by mouth every morning.   Yes [provider]  FLUoxetine (PROZAC) 20 MG capsule TAKE TWO CAPSULES BY MOUTH IN THE MORNING AND TAKE ONE CAPSULE BY MOUTH IN THE EVENING 12/06/15  Yes Ronette Deter  A, MD  glucose blood (PRECISION QID TEST) test strip Use once daily. Use as instructed. 10/20/16 10/20/17 Yes [provider]  loratadine (CLARITIN) 10 MG tablet Take 10 mg by mouth daily.   Yes [provider]  losartan (COZAAR) 50 MG tablet TAKE ONE TABLET BY MOUTH ONCE DAILY 06/29/15  Yes Jackolyn Confer, MD  Multiple Vitamin (MULTIVITAMIN) capsule Take 1 capsule by mouth daily.   Yes [provider]  ranitidine (ZANTAC) 150 MG tablet Take by mouth.   Yes [provider]  vitamin B-12 (CYANOCOBALAMIN) 1000 MCG tablet Take 1,000 mcg by mouth daily.   Yes [provider]  alendronate (FOSAMAX) 70 MG  tablet Take by mouth. 04/25/17 04/25/18  [provider]  bisacodyl (BISACODYL) 5 MG EC tablet Take 5 mg by mouth daily as needed.     [provider]  ferrous sulfate 325 (65 FE) MG tablet Take by mouth.    [provider]  fluocinonide ointment (LIDEX) 0.05 % Apply topically 2 (two) times daily. Patient not taking: Reported on 09/10/2017 09/06/12   Jackolyn Confer, MD  levothyroxine (SYNTHROID, LEVOTHROID) 25 MCG tablet Take 75 mcg by mouth.  09/03/15 09/02/16  [provider]  oxyCODONE-acetaminophen (PERCOCET/ROXICET) 5-325 MG tablet Take by mouth. 09/15/14   [provider]  predniSONE (DELTASONE) 10 MG tablet Taper 6-5-4-3-2-1-off 06/08/17   [provider]    Family History  Problem Relation Age of Onset  . Breast cancer Paternal Aunt      Social History   Tobacco Use  . Smoking status: Never Smoker  . Smokeless tobacco: Never Used  Substance Use Topics  . Alcohol use: No  . Drug use: No    Allergies as of 09/10/2017 - Review Complete 09/10/2017  Allergen Reaction Noted  . Effexor [venlafaxine]  11/19/2015  . Topamax [topiramate]  07/11/2012    Review of Systems:    All systems reviewed and negative except where noted in HPI.   Physical Exam:  BP 137/69   Pulse 73   Ht 5\' 6"  (1.676 m)   Wt 186 lb 8 oz (84.6 kg)   BMI 30.10 kg/m  No LMP recorded. Patient has had a hysterectomy. Psych:  Alert and cooperative. Normal mood and affect. General:   Alert,  Well-developed, well-nourished, pleasant and cooperative in NAD Head:  Normocephalic and atraumatic. Eyes:  Sclera clear, no icterus.   Conjunctiva pink. Ears:  Normal auditory acuity. Nose:  No deformity, discharge, or lesions. Mouth:  No deformity or lesions,oropharynx pink & moist. Neck:  Supple; no masses or thyromegaly. Lungs:  Respirations even and unlabored.  Clear throughout to auscultation.   No wheezes, crackles, or rhonchi. No acute distress. Heart:   Regular rate and rhythm; no murmurs, clicks, rubs, or gallops. Abdomen:  Normal bowel sounds.  No bruits.  Soft, non-tender and non-distended without masses, hepatosplenomegaly or hernias noted.  No guarding or rebound tenderness.  Negative Carnett sign.   Rectal:  Deferred.  Msk:  Symmetrical without gross deformities.  Good, equal movement & strength bilaterally. Pulses:  Normal pulses noted. Extremities:  No clubbing or edema.  No cyanosis. Neurologic:  Alert and oriented x3;  grossly normal neurologically. Skin:  Intact without significant lesions or rashes.  No jaundice. Lymph Nodes:  No significant cervical adenopathy. Psych:  Alert and cooperative. Normal mood and affect.  Imaging Studies: No results found.  Assessment and Plan:   Barbara Mclaughlin is a 67 y.o. y/o female who comes  in with bloating and abdominal distention with gas.  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  The patient has been given samples of both doses of the Amitiza to try after she sees if Citrucel once a day helps her symptoms.  The patient has been told to try and stay away from stimulant laxatives and avoid gassy foods.  The patient has been told to contact us with the dose that works for her and we will write her prescription.  The patient had a colonoscopy that was normal 4 years ago and was recommended to have a repeat colonoscopy in 10 years.  There is no reason to repeat the colonoscopy at this time.  She has also been told that if this does not help her symptoms she should contact my office and she may need to be tried on Linzess.  The patient has been explained the plan and agrees with it.  Lucilla Lame, MD. Marval Regal   Note: This dictation was prepared with Dragon dictation along with smaller phrase technology. Any transcriptional errors that result from this process are unintentional.

## 2017-12-23 ENCOUNTER — Emergency Department
Admission: EM | Admit: 2017-12-23 | Discharge: 2017-12-23 | Disposition: A | Discharge status: Home / Self Care | Payer: Medicare Other | Attending: Emergency Medicine | Admitting: Emergency Medicine

## 2017-12-23 ENCOUNTER — Other Ambulatory Visit: Payer: Self-pay

## 2017-12-23 DIAGNOSIS — I10 Essential (primary) hypertension: Secondary | ICD-10-CM | POA: Insufficient documentation

## 2017-12-23 DIAGNOSIS — Z85828 Personal history of other malignant neoplasm of skin: Secondary | ICD-10-CM | POA: Insufficient documentation

## 2017-12-23 DIAGNOSIS — R000 Tachycardia, unspecified: Secondary | ICD-10-CM | POA: Diagnosis present

## 2017-12-23 DIAGNOSIS — Z79899 Other long term (current) drug therapy: Secondary | ICD-10-CM | POA: Insufficient documentation

## 2017-12-23 DIAGNOSIS — I471 Supraventricular tachycardia: Secondary | ICD-10-CM

## 2017-12-23 DIAGNOSIS — Z9884 Bariatric surgery status: Secondary | ICD-10-CM | POA: Insufficient documentation

## 2017-12-23 DIAGNOSIS — Z96653 Presence of artificial knee joint, bilateral: Secondary | ICD-10-CM | POA: Insufficient documentation

## 2017-12-23 LAB — CBC WITH DIFFERENTIAL/PLATELET
Basophils Absolute: 0.1 10*3/uL (ref 0–0.1)
Basophils Relative: 2 %
EOS ABS: 0.1 10*3/uL (ref 0–0.7)
EOS PCT: 2 %
HCT: 35.6 % (ref 35.0–47.0)
Hemoglobin: 12.4 g/dL (ref 12.0–16.0)
LYMPHS ABS: 1.8 10*3/uL (ref 1.0–3.6)
Lymphocytes Relative: 24 %
MCH: 32.1 pg (ref 26.0–34.0)
MCHC: 34.7 g/dL (ref 32.0–36.0)
MCV: 92.4 fL (ref 80.0–100.0)
Monocytes Absolute: 1 10*3/uL — ABNORMAL HIGH (ref 0.2–0.9)
Monocytes Relative: 14 %
Neutro Abs: 4.5 10*3/uL (ref 1.4–6.5)
Neutrophils Relative %: 60 %
PLATELETS: 356 10*3/uL (ref 150–440)
RBC: 3.85 MIL/uL (ref 3.80–5.20)
RDW: 12.8 % (ref 11.5–14.5)
WBC: 7.6 10*3/uL (ref 3.6–11.0)

## 2017-12-23 LAB — BASIC METABOLIC PANEL
Anion gap: 8 (ref 5–15)
BUN: 20 mg/dL (ref 8–23)
CHLORIDE: 99 mmol/L (ref 98–111)
CO2: 26 mmol/L (ref 22–32)
CREATININE: 1.07 mg/dL — AB (ref 0.44–1.00)
Calcium: 9.4 mg/dL (ref 8.9–10.3)
GFR calc Af Amer: >60 mL/min (ref >60)
GFR, EST NON AFRICAN AMERICAN: 52 mL/min — AB (ref >60)
Glucose, Bld: 116 mg/dL — ABNORMAL HIGH (ref 70–99)
Potassium: 4.3 mmol/L (ref 3.5–5.1)
SODIUM: 133 mmol/L — AB (ref 135–145)

## 2017-12-23 LAB — MAGNESIUM: Magnesium: 2.1 mg/dL (ref 1.7–2.4)

## 2017-12-23 MED ORDER — SODIUM CHLORIDE 0.9 % IV BOLUS: 500.0000 mL | Freq: Once | INTRAVENOUS | Status: DC

## 2017-12-23 MED ORDER — METOPROLOL TARTRATE 25 MG PO TABS
25.0000 mg | ORAL_TABLET | Freq: Once | ORAL | Status: AC
  Administered 2017-12-23: 25 mg via ORAL
  Filled 2017-12-23: qty 1

## 2017-12-23 MED ORDER — ADENOSINE 6 MG/2ML IV SOLN
6.0000 mg | Freq: Once | INTRAVENOUS | Status: AC
  Administered 2017-12-23: 6 mg via INTRAVENOUS

## 2017-12-23 NOTE — ED Provider Notes (Signed)
[facility] Emergency Department Provider Note  ____________________________________________   First MD Initiated Contact with Patient 12/23/17 1456     (approximate)  I have reviewed the triage vital signs and the nursing notes.   HISTORY  Chief Complaint Tachycardia    HPI Barbara Mclaughlin is a 67 y.o. female's medical history includes SVT for which she takes carvedilol 3.125 mg twice daily and sees Dr. Nehemiah Massed.  She presents today by private vehicle for evaluation of elevated heart rate and palpitations.  She says that started acutely an hour and a half ago and the feeling is severe.  However she denies any chest pain or shortness of breath but states that it makes her feel bad in a nonspecific on the way.  There is a little bit of pressure in her back.  She says it is not quite as severe as the last time this happened about a year ago and she required medication in the emergency department to convert back to a regular rate.  She says she has been compliant with her medication.  She denies any increase in caffeine recently, denies sleep deprivation, denies drugs/alcohol use.  No medication changes recently other than an increase in her vitamin B6 supplement.  Past Medical History:  Diagnosis Date  . Acoustic neuroma (HCC)    right ear  . Anemia   . Arthritis   . Basal cell carcinoma, eyelid   . Depression   . Dysrhythmia 0ne episode of tachycardia on 10/03/14  . GERD (gastroesophageal reflux disease)   . Headache   . Heart murmur bruit  . Hypertension   . Hyperthyroidism 01/05/2015  . Osteoporosis   . Postablative hypothyroidism 09/03/2015  . Thyroid nodule     Patient Active Problem List   Diagnosis Date Noted  . Migraines 09/10/2017  . AV nodal re-entry tachycardia ([place]) 11/03/2016  . Vitamin D deficiency, unspecified 06/12/2016  . Schwannoma 10/05/2015  . Postablative hypothyroidism 09/03/2015  . Memory change 07/06/2015  . Insomnia  06/04/2015  . Chronic fatigue 06/04/2015  . Hyperthyroidism 01/05/2015  . Atrophic vaginitis 01/05/2015  . SVT (supraventricular tachycardia) ([place]) 10/08/2014  . Thyroid nodule 08/11/2014  . Basal cell carcinoma of left eyelid 08/11/2014  . Anemia, iron deficiency 04/09/2014  . Acute low back pain 09/16/2013  . Dysphagia, pharyngoesophageal phase 04/29/2013  . Routine general medical examination at a health care facility 01/13/2013  . Chronic pain 11/20/2011  . Screening for breast cancer 11/20/2011  . Arthritis 04/17/2011  . Hypertension 04/17/2011  . Depression 04/17/2011  . GERD (gastroesophageal reflux disease) 04/17/2011  . Menopausal and perimenopausal disorder 04/17/2011  . Overweight (BMI 25.0-29.9) 04/17/2011  . Pre-operative cardiovascular examination 09/19/2010  . Abnormal EKG 09/19/2010    Past Surgical History:  Procedure Laterality Date  . BREAST REDUCTION SURGERY  1990  . EYE SURGERY Bilateral cataract removal  . GASTRIC BYPASS    . LAPAROSCOPIC HYSTERECTOMY  07/2009  . REDUCTION MAMMAPLASTY Bilateral 20+ yrs ago  . TOTAL KNEE ARTHROPLASTY  2013   Dr. Marry Guan, left  . TOTAL KNEE ARTHROPLASTY Right 06/2013    Prior to Admission medications   Medication Sig Start Date End Date Taking? Authorizing Provider  alendronate (FOSAMAX) 70 MG tablet Take by mouth. 04/25/17 04/25/18  [provider]  ALPRAZolam Duanne Moron) 0.25 MG tablet Take by mouth. 11/17/15   [provider]  bisacodyl (BISACODYL) 5 MG EC tablet Take 5 mg by mouth daily as needed.     [provider]  buPROPion (WELLBUTRIN XL) 300 MG 24 hr tablet  07/09/17   [provider]  carvedilol (COREG) 3.125 MG tablet Take by mouth. 05/14/17 05/14/18  [provider]  celecoxib (CELEBREX) 100 MG capsule TAKE ONE CAPSULE BY MOUTH TWICE DAILY 12/06/15   Jackolyn Confer, MD  Cholecalciferol (VITAMIN D3) 2000 UNITS capsule Take 2,000 Units by mouth daily.    [provider]  docusate sodium (COLACE) 100 MG capsule Take by mouth.    [provider]  esomeprazole (NEXIUM) 40 MG capsule Take 80 mg by mouth every morning.    [provider]  ferrous sulfate 325 (65 FE) MG tablet Take by mouth.    [provider]  fluocinonide ointment (LIDEX) 0.05 % Apply topically 2 (two) times daily. Patient not taking: Reported on 09/10/2017 09/06/12   Jackolyn Confer, MD  FLUoxetine (PROZAC) 20 MG capsule TAKE TWO CAPSULES BY MOUTH IN THE MORNING AND TAKE ONE CAPSULE BY MOUTH IN THE EVENING 12/06/15   Jackolyn Confer, MD  levothyroxine (SYNTHROID, LEVOTHROID) 25 MCG tablet Take 75 mcg by mouth.  09/03/15 09/02/16  [provider]  loratadine (CLARITIN) 10 MG tablet Take 10 mg by mouth daily.    [provider]  losartan (COZAAR) 50 MG tablet TAKE ONE TABLET BY MOUTH ONCE DAILY 06/29/15   Jackolyn Confer, MD  Multiple Vitamin (MULTIVITAMIN) capsule Take 1 capsule by mouth daily.    [provider]  oxyCODONE-acetaminophen (PERCOCET/ROXICET) 5-325 MG tablet Take by mouth. 09/15/14   [provider]  predniSONE (DELTASONE) 10 MG tablet Taper 6-5-4-3-2-1-off 06/08/17   [provider]  ranitidine (ZANTAC) 150 MG tablet Take by mouth.    [provider]  vitamin B-12 (CYANOCOBALAMIN) 1000 MCG tablet Take 1,000 mcg by mouth daily.    [provider]    Allergies Effexor [venlafaxine] and Topamax [topiramate]  Family History  Problem Relation Age of Onset  . Breast cancer Paternal Aunt     Social History Social History   Tobacco Use  . Smoking status: Never Smoker  . Smokeless tobacco: Never Used  Substance Use Topics  . Alcohol use: No  . Drug use: No    Review of Systems Constitutional: No fever/chills Eyes: No visual changes. ENT: No sore throat. Cardiovascular: Tachycardia, palpitations, some mild back pressure Respiratory: Denies shortness of breath. Gastrointestinal: No  abdominal pain.  No nausea, no vomiting.  No diarrhea.  No constipation. Genitourinary: Negative for dysuria. Musculoskeletal: Negative for neck pain.  Negative for back pain. Integumentary: Negative for rash. Neurological: Negative for headaches, focal weakness or numbness.   ____________________________________________   PHYSICAL EXAM:  VITAL SIGNS: ED Triage Vitals  Enc Vitals Group     BP 12/23/17 1451 106/86     Pulse Rate 12/23/17 1451 (!) 173     Resp 12/23/17 1451 16     Temp 12/23/17 1451 97.7 F (36.5 C)     Temp Source 12/23/17 1451 Oral     SpO2 12/23/17 1451 99 %     Weight 12/23/17 1452 81.6 kg (180 lb)     Height 12/23/17 1452 1.702 m (5\' 7" )     Head Circumference --      Peak Flow --      Pain Score 12/23/17 1451 0     Pain Loc --      Pain Edu? --      Excl. in [place]? --     Constitutional: Alert and oriented.  Appears uncomfortable but no acute  distress Eyes: Conjunctivae are normal.  Head: Atraumatic. Nose: No congestion/rhinnorhea. Mouth/Throat: Mucous membranes are moist. Neck: No stridor.  No meningeal signs.   Cardiovascular: Tachycardia at 171, regular rhythm. Good peripheral circulation. Grossly normal heart sounds. Respiratory: Normal respiratory effort.  No retractions. Lungs CTAB. Gastrointestinal: Thin body habitus. Soft and nontender. No distention.  Musculoskeletal: No lower extremity tenderness nor edema. No gross deformities of extremities. Neurologic:  Normal speech and language. No gross focal neurologic deficits are appreciated.  Skin:  Skin is warm, dry and intact. No rash noted. Psychiatric: Mood and affect are normal. Speech and behavior are normal.  ____________________________________________   LABS (all labs ordered are listed, but only abnormal results are displayed)  Labs Reviewed  BASIC METABOLIC PANEL - Abnormal; Notable for the following components:      Result Value   Sodium 133 (*)    Glucose, Bld 116 (*)     Creatinine, Ser 1.07 (*)    GFR calc non Af Amer 52 (*)    All other components within normal limits  CBC WITH DIFFERENTIAL/PLATELET - Abnormal; Notable for the following components:   Monocytes Absolute 1.0 (*)    All other components within normal limits  MAGNESIUM   ____________________________________________  EKG  ED ECG REPORT #1 I, Hinda Kehr, the attending physician, personally viewed and interpreted this ECG.  Date: 12/23/2017 EKG Time: 14:45 Rate: 174 Rhythm: SVT QRS Axis: normal Intervals: normal ST/T Wave abnormalities: Non-specific ST segment / T-wave changes, but no evidence of acute ischemia. Narrative Interpretation: Possibility of rate related demand ischemia, no evidence of STEMI   ED ECG REPORT #2 I, Hinda Kehr, the attending physician, personally viewed and interpreted this ECG.  Date: 12/23/2017 EKG Time: 15: 01 Rate: 89 Rhythm: normal sinus rhythm QRS Axis: Right axis deviation Intervals: normal ST/T Wave abnormalities: Non-specific ST segment / T-wave changes, but no evidence of acute ischemia. Narrative Interpretation: no evidence of acute ischemia     ____________________________________________  RADIOLOGY   ED MD interpretation: No indication for imaging  Official radiology report(s): No results found.  ____________________________________________   PROCEDURES  Critical Care performed: Yes, see critical care procedure note(s)   Procedure(s) performed:   .Critical Care Performed by: Hinda Kehr, MD Authorized by: Hinda Kehr, MD   Critical care provider statement:    Critical care time (minutes):  30   Critical care time was exclusive of:  Separately billable procedures and treating other patients   Critical care was necessary to treat or prevent imminent or life-threatening deterioration of the following conditions: SVT requiring adenosine and Zoll monitor/pads.   Critical care was time spent personally by me on the  following activities:  Development of treatment plan with patient or surrogate, discussions with consultants, evaluation of patient's response to treatment, examination of patient, obtaining history from patient or surrogate, ordering and performing treatments and interventions, ordering and review of laboratory studies, ordering and review of radiographic studies, pulse oximetry, re-evaluation of patient's condition and review of old charts    I performed chemical cardioversion of the patient's supraventricular tachycardia using 6 mg of adenosine rapidly flushed. Within a minute the patient became very briefly uncomfortable, had a sinus pause, then converted back to sinus rhythm with borderline tachycardia.   ____________________________________________   INITIAL IMPRESSION / ASSESSMENT AND PLAN / ED COURSE  As part of my medical decision making, I reviewed the following data within the electronic MEDICAL RECORD NUMBER History obtained from family, Labs  reviewed , EKG interpreted , Old chart reviewed, Discussed with cardiologist (Dr. Saralyn Pilar) and Notes from prior ED visits    Differential diagnosis includes, but is not limited to, SVT, A. fib with RVR, electrolyte abnormality, medication side effect, caffeine or other unintentional drug overdose, acute infection.  The patient was in her usual state of health until the symptoms started about an hour and a half prior to arrival.  She had no symptoms of infectious process beforehand and denies any contributory factors such as sleep deprivation or increased caffeine use.  Even when she was in SVT she denies any chest pain or shortness of breath, just reports some tightness in her back and feeling ill in general.  I reviewed her chart and saw that during her last visit, approximately 14 months ago, she required conversion with adenosine but tolerated without difficulty.  After she was hooked up to the Zoll and a liter of fluids was ready, we  administered adenosine as described in the procedure section above with successful cardioversion to a normal sinus rhythm and a complete resolution of her symptoms.  I ordered Toprol tartrate 25 mg p.o. to try and prevent recurrence of the SVT.  Subsequently I discussed case by phone with Dr. Saralyn Pilar who is on-call for her cardiologist, Dr. Nehemiah Massed.  We discussed the case and he agreed with the management thus far and recommended no additional medication changes, particularly given that she has been well controlled on the carvedilol for a year.  I am waiting for lab results to make sure her electrolytes are within normal limits, will replete as needed, and after short period of observation we will discharge patient home for outpatient follow-up.  Clinical Course as of Dec 23 1616  Nancy Fetter Dec 23, 2017  1617 Patient asymptomatic with a systolic blood pressure in the 120s and a heart rate of 60.  She says she is ready to go home and I agree.  Lab results are reassuring with no evidence of acute electro light abnormality, normal potassium, normal magnesium, etc.  Red cell counts are appropriate and within normal limits.  I gave my usual customary return precautions and reiterated the plan for her to stay on her regular medicines and follow with Dr. Nehemiah Massed in clinic.   [CF]    Clinical Course User Index [CF] Hinda Kehr, MD    ____________________________________________  FINAL CLINICAL IMPRESSION(S) / ED DIAGNOSES  Final diagnoses:  SVT (supraventricular tachycardia) ([place])     MEDICATIONS GIVEN DURING THIS VISIT:  Medications  metoprolol tartrate (LOPRESSOR) tablet 25 mg (has no administration in time range)  sodium chloride 0.9 % bolus 500 mL (has no administration in time range)  adenosine (ADENOCARD) 6 MG/2ML injection 6 mg (6 mg Intravenous Given 12/23/17 1501)     ED Discharge Orders    None       Note:  This document was prepared using Dragon voice recognition software and  may include unintentional dictation errors.    Hinda Kehr, MD 12/23/17 [phone]

## 2017-12-23 NOTE — Discharge Instructions (Signed)
As we discussed, your signs and symptoms strongly suggest a condition called paroxysmal supraventricular tachycardia (PSVT).  This is a generally benign condition, but we recommend you follow up with Dr. Nehemiah Massed to discuss any recommended medication changes.  For now, continue on your carvedilol at the current dose.  Go ahead and take your evening dose tonight.  Please read through the included information and try some of the techniques we discussed the next time you have the symptoms.  Return to the Emergency Department if you develop new or worsening symptoms that concern you.

## 2017-12-23 NOTE — ED Triage Notes (Signed)
Pt arrives to ED with elevated HR. States it was 170 at home. Pt HR in triage in 150's-180-'s. Hx of same. Was given meds before to bring HR down. Takes carvedilol. Alert, oriented, well appearing. Talking in complete sentences.

## 2018-07-01 DIAGNOSIS — L570 Actinic keratosis: Secondary | ICD-10-CM | POA: Diagnosis not present

## 2018-07-01 DIAGNOSIS — Z86018 Personal history of other benign neoplasm: Secondary | ICD-10-CM | POA: Diagnosis not present

## 2018-07-01 DIAGNOSIS — L578 Other skin changes due to chronic exposure to nonionizing radiation: Secondary | ICD-10-CM | POA: Diagnosis not present

## 2018-07-01 DIAGNOSIS — Z872 Personal history of diseases of the skin and subcutaneous tissue: Secondary | ICD-10-CM | POA: Diagnosis not present

## 2018-07-02 DIAGNOSIS — I10 Essential (primary) hypertension: Secondary | ICD-10-CM | POA: Diagnosis not present

## 2018-07-02 DIAGNOSIS — Z79899 Other long term (current) drug therapy: Secondary | ICD-10-CM | POA: Diagnosis not present

## 2018-07-02 DIAGNOSIS — E559 Vitamin D deficiency, unspecified: Secondary | ICD-10-CM | POA: Diagnosis not present

## 2018-07-02 DIAGNOSIS — Z1239 Encounter for other screening for malignant neoplasm of breast: Secondary | ICD-10-CM | POA: Diagnosis not present

## 2018-07-02 DIAGNOSIS — Z0000 Encounter for general adult medical examination without abnormal findings: Secondary | ICD-10-CM | POA: Diagnosis not present

## 2018-07-02 DIAGNOSIS — Z13220 Encounter for screening for lipoid disorders: Secondary | ICD-10-CM | POA: Diagnosis not present

## 2018-07-02 DIAGNOSIS — R5382 Chronic fatigue, unspecified: Secondary | ICD-10-CM | POA: Diagnosis not present

## 2018-07-02 DIAGNOSIS — E890 Postprocedural hypothyroidism: Secondary | ICD-10-CM | POA: Diagnosis not present

## 2018-07-02 DIAGNOSIS — F3341 Major depressive disorder, recurrent, in partial remission: Secondary | ICD-10-CM | POA: Diagnosis not present

## 2018-07-03 DIAGNOSIS — G8929 Other chronic pain: Secondary | ICD-10-CM | POA: Diagnosis not present

## 2018-07-03 DIAGNOSIS — M5442 Lumbago with sciatica, left side: Secondary | ICD-10-CM | POA: Diagnosis not present

## 2018-07-03 DIAGNOSIS — M545 Low back pain: Secondary | ICD-10-CM | POA: Diagnosis not present

## 2018-07-16 DIAGNOSIS — Z13220 Encounter for screening for lipoid disorders: Secondary | ICD-10-CM | POA: Diagnosis not present

## 2018-07-16 DIAGNOSIS — R8290 Unspecified abnormal findings in urine: Secondary | ICD-10-CM | POA: Diagnosis not present

## 2018-07-16 DIAGNOSIS — E890 Postprocedural hypothyroidism: Secondary | ICD-10-CM | POA: Diagnosis not present

## 2018-07-16 DIAGNOSIS — Z79899 Other long term (current) drug therapy: Secondary | ICD-10-CM | POA: Diagnosis not present

## 2018-07-22 DIAGNOSIS — M461 Sacroiliitis, not elsewhere classified: Secondary | ICD-10-CM | POA: Diagnosis not present

## 2018-07-22 DIAGNOSIS — M47816 Spondylosis without myelopathy or radiculopathy, lumbar region: Secondary | ICD-10-CM | POA: Diagnosis not present

## 2018-07-26 DIAGNOSIS — M461 Sacroiliitis, not elsewhere classified: Secondary | ICD-10-CM | POA: Diagnosis not present

## 2018-09-02 DIAGNOSIS — M47816 Spondylosis without myelopathy or radiculopathy, lumbar region: Secondary | ICD-10-CM | POA: Diagnosis not present

## 2018-09-02 DIAGNOSIS — M461 Sacroiliitis, not elsewhere classified: Secondary | ICD-10-CM | POA: Diagnosis not present

## 2018-09-06 DIAGNOSIS — E890 Postprocedural hypothyroidism: Secondary | ICD-10-CM | POA: Diagnosis not present

## 2018-09-11 DIAGNOSIS — M47816 Spondylosis without myelopathy or radiculopathy, lumbar region: Secondary | ICD-10-CM | POA: Diagnosis not present

## 2018-09-12 DIAGNOSIS — E042 Nontoxic multinodular goiter: Secondary | ICD-10-CM | POA: Diagnosis not present

## 2018-09-12 DIAGNOSIS — E890 Postprocedural hypothyroidism: Secondary | ICD-10-CM | POA: Diagnosis not present

## 2018-09-26 ENCOUNTER — Other Ambulatory Visit: Payer: Self-pay | Admitting: *Deleted

## 2018-09-26 NOTE — Patient Outreach (Signed)
Late entry 08/07/18- Telephone call to patient for follow up on [place]. Spoke with pt, addressed needs, pt reports she has no needs at present, Per Surveyor, quantity pt to be placed into engagement program.  RN CM mailed welcome letter to pt home including 24 hour nurse line magnet and pamphlet.    PLAN Outreach pt in 6 months  Jacqlyn Larsen [facility], [place] Coordinator [phone]

## 2018-10-01 DIAGNOSIS — I10 Essential (primary) hypertension: Secondary | ICD-10-CM | POA: Diagnosis not present

## 2018-10-01 DIAGNOSIS — M533 Sacrococcygeal disorders, not elsewhere classified: Secondary | ICD-10-CM | POA: Diagnosis not present

## 2018-10-01 DIAGNOSIS — F3341 Major depressive disorder, recurrent, in partial remission: Secondary | ICD-10-CM | POA: Diagnosis not present

## 2018-10-01 DIAGNOSIS — Z13820 Encounter for screening for osteoporosis: Secondary | ICD-10-CM | POA: Diagnosis not present

## 2018-10-01 DIAGNOSIS — E890 Postprocedural hypothyroidism: Secondary | ICD-10-CM | POA: Diagnosis not present

## 2018-10-01 DIAGNOSIS — R194 Change in bowel habit: Secondary | ICD-10-CM | POA: Diagnosis not present

## 2018-10-01 DIAGNOSIS — R5382 Chronic fatigue, unspecified: Secondary | ICD-10-CM | POA: Diagnosis not present

## 2018-10-03 ENCOUNTER — Other Ambulatory Visit: Payer: Self-pay | Admitting: Internal Medicine

## 2018-10-03 DIAGNOSIS — Z1231 Encounter for screening mammogram for malignant neoplasm of breast: Secondary | ICD-10-CM

## 2018-10-14 ENCOUNTER — Other Ambulatory Visit: Payer: Self-pay | Admitting: Student

## 2018-10-14 DIAGNOSIS — D490 Neoplasm of unspecified behavior of digestive system: Secondary | ICD-10-CM | POA: Diagnosis not present

## 2018-10-14 DIAGNOSIS — K5909 Other constipation: Secondary | ICD-10-CM | POA: Diagnosis not present

## 2018-10-14 DIAGNOSIS — K219 Gastro-esophageal reflux disease without esophagitis: Secondary | ICD-10-CM | POA: Diagnosis not present

## 2018-10-14 DIAGNOSIS — R141 Gas pain: Secondary | ICD-10-CM

## 2018-10-14 DIAGNOSIS — R140 Abdominal distension (gaseous): Secondary | ICD-10-CM | POA: Diagnosis not present

## 2018-10-14 DIAGNOSIS — R1310 Dysphagia, unspecified: Secondary | ICD-10-CM | POA: Diagnosis not present

## 2018-10-16 DIAGNOSIS — E042 Nontoxic multinodular goiter: Secondary | ICD-10-CM | POA: Diagnosis not present

## 2018-10-16 DIAGNOSIS — M810 Age-related osteoporosis without current pathological fracture: Secondary | ICD-10-CM | POA: Diagnosis not present

## 2018-10-24 ENCOUNTER — Ambulatory Visit
Admission: RE | Admit: 2018-10-24 | Discharge: 2018-10-24 | Disposition: A | Discharge status: Home / Self Care | Payer: PPO | Source: Ambulatory Visit | Attending: Student | Admitting: Student

## 2018-10-24 ENCOUNTER — Other Ambulatory Visit: Payer: Self-pay | Admitting: Student

## 2018-10-24 ENCOUNTER — Other Ambulatory Visit: Payer: Self-pay

## 2018-10-24 DIAGNOSIS — D490 Neoplasm of unspecified behavior of digestive system: Secondary | ICD-10-CM | POA: Insufficient documentation

## 2018-10-24 DIAGNOSIS — R140 Abdominal distension (gaseous): Secondary | ICD-10-CM | POA: Diagnosis not present

## 2018-10-24 DIAGNOSIS — R933 Abnormal findings on diagnostic imaging of other parts of digestive tract: Secondary | ICD-10-CM | POA: Diagnosis not present

## 2018-10-24 DIAGNOSIS — R141 Gas pain: Secondary | ICD-10-CM | POA: Diagnosis not present

## 2018-10-24 DIAGNOSIS — K862 Cyst of pancreas: Secondary | ICD-10-CM | POA: Diagnosis not present

## 2018-10-24 LAB — POCT I-STAT CREATININE: Creatinine, Ser: 0.7 mg/dL (ref 0.44–1.00)

## 2018-10-24 MED ORDER — GADOBUTROL 1 MMOL/ML IV SOLN
7.0000 mL | Freq: Once | INTRAVENOUS | Status: AC | PRN
  Administered 2018-10-24: 7 mL via INTRAVENOUS

## 2018-10-28 ENCOUNTER — Other Ambulatory Visit: Payer: Self-pay | Admitting: *Deleted

## 2018-10-28 NOTE — Patient Outreach (Signed)
Case closed due to transition to external program [employer]/ [facility].  RN CM faxed case closure letter to primary care provider.    Case closed  Jacqlyn Larsen [facility], [place] Coordinator [phone]

## 2018-11-12 DIAGNOSIS — I10 Essential (primary) hypertension: Secondary | ICD-10-CM | POA: Diagnosis not present

## 2018-11-12 DIAGNOSIS — M533 Sacrococcygeal disorders, not elsewhere classified: Secondary | ICD-10-CM | POA: Diagnosis not present

## 2018-11-12 DIAGNOSIS — F3341 Major depressive disorder, recurrent, in partial remission: Secondary | ICD-10-CM | POA: Diagnosis not present

## 2018-11-14 ENCOUNTER — Ambulatory Visit
Admission: RE | Admit: 2018-11-14 | Discharge: 2018-11-14 | Disposition: A | Discharge status: Home / Self Care | Payer: PPO | Source: Ambulatory Visit | Attending: Internal Medicine | Admitting: Internal Medicine

## 2018-11-14 DIAGNOSIS — Z1231 Encounter for screening mammogram for malignant neoplasm of breast: Secondary | ICD-10-CM | POA: Diagnosis not present

## 2018-11-20 DIAGNOSIS — I10 Essential (primary) hypertension: Secondary | ICD-10-CM | POA: Diagnosis not present

## 2018-11-20 DIAGNOSIS — E782 Mixed hyperlipidemia: Secondary | ICD-10-CM | POA: Diagnosis not present

## 2018-11-20 DIAGNOSIS — I471 Supraventricular tachycardia: Secondary | ICD-10-CM | POA: Diagnosis not present

## 2018-12-06 DIAGNOSIS — I10 Essential (primary) hypertension: Secondary | ICD-10-CM | POA: Diagnosis not present

## 2018-12-06 DIAGNOSIS — F3341 Major depressive disorder, recurrent, in partial remission: Secondary | ICD-10-CM | POA: Diagnosis not present

## 2018-12-06 DIAGNOSIS — I471 Supraventricular tachycardia: Secondary | ICD-10-CM | POA: Diagnosis not present

## 2019-01-27 DIAGNOSIS — K219 Gastro-esophageal reflux disease without esophagitis: Secondary | ICD-10-CM | POA: Diagnosis not present

## 2019-01-27 DIAGNOSIS — R143 Flatulence: Secondary | ICD-10-CM | POA: Diagnosis not present

## 2019-01-27 DIAGNOSIS — D490 Neoplasm of unspecified behavior of digestive system: Secondary | ICD-10-CM | POA: Diagnosis not present

## 2019-01-27 DIAGNOSIS — K5909 Other constipation: Secondary | ICD-10-CM | POA: Diagnosis not present

## 2019-01-27 DIAGNOSIS — R140 Abdominal distension (gaseous): Secondary | ICD-10-CM | POA: Diagnosis not present

## 2019-01-30 DIAGNOSIS — I471 Supraventricular tachycardia: Secondary | ICD-10-CM | POA: Diagnosis not present

## 2019-01-30 DIAGNOSIS — E890 Postprocedural hypothyroidism: Secondary | ICD-10-CM | POA: Diagnosis not present

## 2019-01-30 DIAGNOSIS — R5382 Chronic fatigue, unspecified: Secondary | ICD-10-CM | POA: Diagnosis not present

## 2019-01-30 DIAGNOSIS — Z23 Encounter for immunization: Secondary | ICD-10-CM | POA: Diagnosis not present

## 2019-01-30 DIAGNOSIS — F3341 Major depressive disorder, recurrent, in partial remission: Secondary | ICD-10-CM | POA: Diagnosis not present

## 2019-01-30 DIAGNOSIS — I10 Essential (primary) hypertension: Secondary | ICD-10-CM | POA: Diagnosis not present

## 2019-02-06 ENCOUNTER — Ambulatory Visit: Payer: BLUE CROSS/BLUE SHIELD | Admitting: *Deleted

## 2019-03-10 DIAGNOSIS — M5136 Other intervertebral disc degeneration, lumbar region: Secondary | ICD-10-CM | POA: Diagnosis not present

## 2019-03-10 DIAGNOSIS — M47816 Spondylosis without myelopathy or radiculopathy, lumbar region: Secondary | ICD-10-CM | POA: Diagnosis not present

## 2019-03-10 DIAGNOSIS — M461 Sacroiliitis, not elsewhere classified: Secondary | ICD-10-CM | POA: Diagnosis not present

## 2019-03-14 DIAGNOSIS — E890 Postprocedural hypothyroidism: Secondary | ICD-10-CM | POA: Diagnosis not present

## 2019-03-21 DIAGNOSIS — E042 Nontoxic multinodular goiter: Secondary | ICD-10-CM | POA: Diagnosis not present

## 2019-03-21 DIAGNOSIS — E890 Postprocedural hypothyroidism: Secondary | ICD-10-CM | POA: Diagnosis not present

## 2019-03-28 DIAGNOSIS — G8929 Other chronic pain: Secondary | ICD-10-CM | POA: Diagnosis not present

## 2019-03-28 DIAGNOSIS — M545 Low back pain: Secondary | ICD-10-CM | POA: Diagnosis not present

## 2019-04-07 DIAGNOSIS — I10 Essential (primary) hypertension: Secondary | ICD-10-CM | POA: Diagnosis not present

## 2019-04-07 DIAGNOSIS — R42 Dizziness and giddiness: Secondary | ICD-10-CM | POA: Diagnosis not present

## 2019-04-07 DIAGNOSIS — I471 Supraventricular tachycardia: Secondary | ICD-10-CM | POA: Diagnosis not present

## 2019-04-07 DIAGNOSIS — E782 Mixed hyperlipidemia: Secondary | ICD-10-CM | POA: Diagnosis not present

## 2019-04-10 DIAGNOSIS — M545 Low back pain: Secondary | ICD-10-CM | POA: Diagnosis not present

## 2019-04-10 DIAGNOSIS — G8929 Other chronic pain: Secondary | ICD-10-CM | POA: Diagnosis not present

## 2019-04-17 DIAGNOSIS — I471 Supraventricular tachycardia: Secondary | ICD-10-CM | POA: Diagnosis not present

## 2019-04-21 DIAGNOSIS — M47816 Spondylosis without myelopathy or radiculopathy, lumbar region: Secondary | ICD-10-CM | POA: Diagnosis not present

## 2019-04-21 DIAGNOSIS — M5136 Other intervertebral disc degeneration, lumbar region: Secondary | ICD-10-CM | POA: Diagnosis not present

## 2019-04-21 DIAGNOSIS — M461 Sacroiliitis, not elsewhere classified: Secondary | ICD-10-CM | POA: Diagnosis not present

## 2019-04-23 DIAGNOSIS — M47816 Spondylosis without myelopathy or radiculopathy, lumbar region: Secondary | ICD-10-CM | POA: Diagnosis not present

## 2019-04-28 DIAGNOSIS — I471 Supraventricular tachycardia: Secondary | ICD-10-CM | POA: Diagnosis not present

## 2019-05-05 DIAGNOSIS — E890 Postprocedural hypothyroidism: Secondary | ICD-10-CM | POA: Diagnosis not present

## 2019-05-05 DIAGNOSIS — E559 Vitamin D deficiency, unspecified: Secondary | ICD-10-CM | POA: Diagnosis not present

## 2019-05-05 DIAGNOSIS — Z79899 Other long term (current) drug therapy: Secondary | ICD-10-CM | POA: Diagnosis not present

## 2019-05-05 DIAGNOSIS — I471 Supraventricular tachycardia: Secondary | ICD-10-CM | POA: Diagnosis not present

## 2019-05-05 DIAGNOSIS — E782 Mixed hyperlipidemia: Secondary | ICD-10-CM | POA: Diagnosis not present

## 2019-05-05 DIAGNOSIS — I10 Essential (primary) hypertension: Secondary | ICD-10-CM | POA: Diagnosis not present

## 2019-05-05 DIAGNOSIS — R55 Syncope and collapse: Secondary | ICD-10-CM | POA: Diagnosis not present

## 2019-05-05 DIAGNOSIS — R5382 Chronic fatigue, unspecified: Secondary | ICD-10-CM | POA: Diagnosis not present

## 2019-05-07 DIAGNOSIS — I10 Essential (primary) hypertension: Secondary | ICD-10-CM | POA: Diagnosis not present

## 2019-05-07 DIAGNOSIS — E782 Mixed hyperlipidemia: Secondary | ICD-10-CM | POA: Diagnosis not present

## 2019-05-07 DIAGNOSIS — I471 Supraventricular tachycardia: Secondary | ICD-10-CM | POA: Diagnosis not present

## 2019-05-15 ENCOUNTER — Other Ambulatory Visit: Payer: Self-pay | Admitting: Student

## 2019-05-15 DIAGNOSIS — D490 Neoplasm of unspecified behavior of digestive system: Secondary | ICD-10-CM

## 2019-05-18 ENCOUNTER — Ambulatory Visit
Admission: RE | Admit: 2019-05-18 | Discharge: 2019-05-18 | Disposition: A | Discharge status: Home / Self Care | Payer: PPO | Source: Ambulatory Visit | Attending: Student | Admitting: Student

## 2019-05-18 ENCOUNTER — Other Ambulatory Visit: Payer: Self-pay

## 2019-05-18 DIAGNOSIS — D490 Neoplasm of unspecified behavior of digestive system: Secondary | ICD-10-CM

## 2019-05-18 DIAGNOSIS — K862 Cyst of pancreas: Secondary | ICD-10-CM | POA: Diagnosis not present

## 2019-05-18 DIAGNOSIS — R933 Abnormal findings on diagnostic imaging of other parts of digestive tract: Secondary | ICD-10-CM | POA: Diagnosis not present

## 2019-05-18 MED ORDER — GADOBUTROL 1 MMOL/ML IV SOLN
7.5000 mL | Freq: Once | INTRAVENOUS | Status: AC | PRN
  Administered 2019-05-18: 11:00:00 7.5 mL via INTRAVENOUS

## 2019-06-16 DIAGNOSIS — D3610 Benign neoplasm of peripheral nerves and autonomic nervous system, unspecified: Secondary | ICD-10-CM | POA: Diagnosis not present

## 2019-06-16 DIAGNOSIS — R42 Dizziness and giddiness: Secondary | ICD-10-CM | POA: Diagnosis not present

## 2019-06-16 DIAGNOSIS — R232 Flushing: Secondary | ICD-10-CM | POA: Diagnosis not present

## 2019-06-16 DIAGNOSIS — Z79899 Other long term (current) drug therapy: Secondary | ICD-10-CM | POA: Diagnosis not present

## 2019-06-16 DIAGNOSIS — R200 Anesthesia of skin: Secondary | ICD-10-CM | POA: Diagnosis not present

## 2019-06-16 DIAGNOSIS — R410 Disorientation, unspecified: Secondary | ICD-10-CM | POA: Diagnosis not present

## 2019-06-16 DIAGNOSIS — E559 Vitamin D deficiency, unspecified: Secondary | ICD-10-CM | POA: Diagnosis not present

## 2019-06-16 DIAGNOSIS — F3341 Major depressive disorder, recurrent, in partial remission: Secondary | ICD-10-CM | POA: Diagnosis not present

## 2019-06-16 DIAGNOSIS — E531 Pyridoxine deficiency: Secondary | ICD-10-CM | POA: Diagnosis not present

## 2019-06-16 DIAGNOSIS — R2689 Other abnormalities of gait and mobility: Secondary | ICD-10-CM | POA: Diagnosis not present

## 2019-06-16 DIAGNOSIS — E519 Thiamine deficiency, unspecified: Secondary | ICD-10-CM | POA: Diagnosis not present

## 2019-06-16 DIAGNOSIS — Z114 Encounter for screening for human immunodeficiency virus [HIV]: Secondary | ICD-10-CM | POA: Diagnosis not present

## 2019-06-16 DIAGNOSIS — E538 Deficiency of other specified B group vitamins: Secondary | ICD-10-CM | POA: Diagnosis not present

## 2019-06-19 ENCOUNTER — Other Ambulatory Visit: Payer: Self-pay | Admitting: Neurology

## 2019-06-19 ENCOUNTER — Other Ambulatory Visit (HOSPITAL_COMMUNITY): Payer: Self-pay | Admitting: Neurology

## 2019-06-19 DIAGNOSIS — R42 Dizziness and giddiness: Secondary | ICD-10-CM

## 2019-06-19 DIAGNOSIS — D3610 Benign neoplasm of peripheral nerves and autonomic nervous system, unspecified: Secondary | ICD-10-CM

## 2019-06-30 ENCOUNTER — Other Ambulatory Visit: Payer: Self-pay

## 2019-06-30 ENCOUNTER — Ambulatory Visit
Admission: RE | Admit: 2019-06-30 | Discharge: 2019-06-30 | Disposition: A | Discharge status: Home / Self Care | Payer: PPO | Source: Ambulatory Visit | Attending: Neurology | Admitting: Neurology

## 2019-06-30 DIAGNOSIS — D3610 Benign neoplasm of peripheral nerves and autonomic nervous system, unspecified: Secondary | ICD-10-CM | POA: Diagnosis not present

## 2019-06-30 DIAGNOSIS — R42 Dizziness and giddiness: Secondary | ICD-10-CM | POA: Insufficient documentation

## 2019-06-30 LAB — POCT I-STAT CREATININE: Creatinine, Ser: 0.8 mg/dL (ref 0.44–1.00)

## 2019-06-30 MED ORDER — GADOBUTROL 1 MMOL/ML IV SOLN
8.0000 mL | Freq: Once | INTRAVENOUS | Status: AC | PRN
  Administered 2019-06-30: 17:00:00 8 mL via INTRAVENOUS

## 2019-07-03 DIAGNOSIS — L570 Actinic keratosis: Secondary | ICD-10-CM | POA: Diagnosis not present

## 2019-07-03 DIAGNOSIS — L821 Other seborrheic keratosis: Secondary | ICD-10-CM | POA: Diagnosis not present

## 2019-07-03 DIAGNOSIS — L814 Other melanin hyperpigmentation: Secondary | ICD-10-CM | POA: Diagnosis not present

## 2019-07-03 DIAGNOSIS — L578 Other skin changes due to chronic exposure to nonionizing radiation: Secondary | ICD-10-CM | POA: Diagnosis not present

## 2019-07-03 DIAGNOSIS — D485 Neoplasm of uncertain behavior of skin: Secondary | ICD-10-CM | POA: Diagnosis not present

## 2019-07-03 DIAGNOSIS — Z872 Personal history of diseases of the skin and subcutaneous tissue: Secondary | ICD-10-CM | POA: Diagnosis not present

## 2019-07-03 DIAGNOSIS — C44319 Basal cell carcinoma of skin of other parts of face: Secondary | ICD-10-CM | POA: Diagnosis not present

## 2019-07-03 DIAGNOSIS — Z86018 Personal history of other benign neoplasm: Secondary | ICD-10-CM | POA: Diagnosis not present

## 2019-07-14 DIAGNOSIS — R200 Anesthesia of skin: Secondary | ICD-10-CM | POA: Diagnosis not present

## 2019-07-21 DIAGNOSIS — Z961 Presence of intraocular lens: Secondary | ICD-10-CM | POA: Diagnosis not present

## 2019-07-22 DIAGNOSIS — C44319 Basal cell carcinoma of skin of other parts of face: Secondary | ICD-10-CM | POA: Diagnosis not present

## 2019-07-22 DIAGNOSIS — C4491 Basal cell carcinoma of skin, unspecified: Secondary | ICD-10-CM | POA: Diagnosis not present

## 2019-08-11 DIAGNOSIS — R140 Abdominal distension (gaseous): Secondary | ICD-10-CM | POA: Diagnosis not present

## 2019-08-11 DIAGNOSIS — K449 Diaphragmatic hernia without obstruction or gangrene: Secondary | ICD-10-CM | POA: Diagnosis not present

## 2019-08-11 DIAGNOSIS — K5909 Other constipation: Secondary | ICD-10-CM | POA: Diagnosis not present

## 2019-08-11 DIAGNOSIS — R143 Flatulence: Secondary | ICD-10-CM | POA: Diagnosis not present

## 2019-08-11 DIAGNOSIS — D490 Neoplasm of unspecified behavior of digestive system: Secondary | ICD-10-CM | POA: Diagnosis not present

## 2019-08-11 DIAGNOSIS — K8689 Other specified diseases of pancreas: Secondary | ICD-10-CM | POA: Diagnosis not present

## 2019-08-11 DIAGNOSIS — K219 Gastro-esophageal reflux disease without esophagitis: Secondary | ICD-10-CM | POA: Diagnosis not present

## 2019-08-15 DIAGNOSIS — R569 Unspecified convulsions: Secondary | ICD-10-CM | POA: Diagnosis not present

## 2019-08-22 DIAGNOSIS — R569 Unspecified convulsions: Secondary | ICD-10-CM | POA: Diagnosis not present

## 2019-08-22 DIAGNOSIS — R2689 Other abnormalities of gait and mobility: Secondary | ICD-10-CM | POA: Diagnosis not present

## 2019-08-22 DIAGNOSIS — F3341 Major depressive disorder, recurrent, in partial remission: Secondary | ICD-10-CM | POA: Diagnosis not present

## 2019-08-22 DIAGNOSIS — D3610 Benign neoplasm of peripheral nerves and autonomic nervous system, unspecified: Secondary | ICD-10-CM | POA: Diagnosis not present

## 2019-08-25 ENCOUNTER — Other Ambulatory Visit: Payer: Self-pay | Admitting: Internal Medicine

## 2019-08-25 DIAGNOSIS — Z79899 Other long term (current) drug therapy: Secondary | ICD-10-CM | POA: Diagnosis not present

## 2019-08-25 DIAGNOSIS — E782 Mixed hyperlipidemia: Secondary | ICD-10-CM | POA: Diagnosis not present

## 2019-08-25 DIAGNOSIS — R5382 Chronic fatigue, unspecified: Secondary | ICD-10-CM | POA: Diagnosis not present

## 2019-08-25 DIAGNOSIS — Z1231 Encounter for screening mammogram for malignant neoplasm of breast: Secondary | ICD-10-CM

## 2019-08-25 DIAGNOSIS — I471 Supraventricular tachycardia: Secondary | ICD-10-CM | POA: Diagnosis not present

## 2019-08-25 DIAGNOSIS — I10 Essential (primary) hypertension: Secondary | ICD-10-CM | POA: Diagnosis not present

## 2019-08-25 DIAGNOSIS — F3341 Major depressive disorder, recurrent, in partial remission: Secondary | ICD-10-CM | POA: Diagnosis not present

## 2019-08-25 DIAGNOSIS — E890 Postprocedural hypothyroidism: Secondary | ICD-10-CM | POA: Diagnosis not present

## 2019-08-25 DIAGNOSIS — Z0000 Encounter for general adult medical examination without abnormal findings: Secondary | ICD-10-CM | POA: Diagnosis not present

## 2019-08-25 DIAGNOSIS — E559 Vitamin D deficiency, unspecified: Secondary | ICD-10-CM | POA: Diagnosis not present

## 2019-09-01 DIAGNOSIS — F5105 Insomnia due to other mental disorder: Secondary | ICD-10-CM | POA: Diagnosis not present

## 2019-09-01 DIAGNOSIS — F331 Major depressive disorder, recurrent, moderate: Secondary | ICD-10-CM | POA: Diagnosis not present

## 2019-09-13 DIAGNOSIS — F331 Major depressive disorder, recurrent, moderate: Secondary | ICD-10-CM | POA: Diagnosis not present

## 2019-09-13 DIAGNOSIS — F5105 Insomnia due to other mental disorder: Secondary | ICD-10-CM | POA: Diagnosis not present

## 2019-10-14 DIAGNOSIS — F5105 Insomnia due to other mental disorder: Secondary | ICD-10-CM | POA: Diagnosis not present

## 2019-10-14 DIAGNOSIS — F331 Major depressive disorder, recurrent, moderate: Secondary | ICD-10-CM | POA: Diagnosis not present

## 2019-11-17 DIAGNOSIS — I10 Essential (primary) hypertension: Secondary | ICD-10-CM | POA: Diagnosis not present

## 2019-11-17 DIAGNOSIS — E890 Postprocedural hypothyroidism: Secondary | ICD-10-CM | POA: Diagnosis not present

## 2019-11-17 DIAGNOSIS — Z79899 Other long term (current) drug therapy: Secondary | ICD-10-CM | POA: Diagnosis not present

## 2019-11-17 DIAGNOSIS — E782 Mixed hyperlipidemia: Secondary | ICD-10-CM | POA: Diagnosis not present

## 2019-11-24 DIAGNOSIS — E890 Postprocedural hypothyroidism: Secondary | ICD-10-CM | POA: Diagnosis not present

## 2019-11-24 DIAGNOSIS — F3341 Major depressive disorder, recurrent, in partial remission: Secondary | ICD-10-CM | POA: Diagnosis not present

## 2019-11-24 DIAGNOSIS — E559 Vitamin D deficiency, unspecified: Secondary | ICD-10-CM | POA: Diagnosis not present

## 2019-11-24 DIAGNOSIS — E782 Mixed hyperlipidemia: Secondary | ICD-10-CM | POA: Diagnosis not present

## 2019-11-24 DIAGNOSIS — I10 Essential (primary) hypertension: Secondary | ICD-10-CM | POA: Diagnosis not present

## 2019-12-01 DIAGNOSIS — M461 Sacroiliitis, not elsewhere classified: Secondary | ICD-10-CM | POA: Diagnosis not present

## 2019-12-01 DIAGNOSIS — M47816 Spondylosis without myelopathy or radiculopathy, lumbar region: Secondary | ICD-10-CM | POA: Diagnosis not present

## 2019-12-01 DIAGNOSIS — M5136 Other intervertebral disc degeneration, lumbar region: Secondary | ICD-10-CM | POA: Diagnosis not present

## 2019-12-09 DIAGNOSIS — F331 Major depressive disorder, recurrent, moderate: Secondary | ICD-10-CM | POA: Diagnosis not present

## 2019-12-09 DIAGNOSIS — F5105 Insomnia due to other mental disorder: Secondary | ICD-10-CM | POA: Diagnosis not present

## 2019-12-11 DIAGNOSIS — Z79899 Other long term (current) drug therapy: Secondary | ICD-10-CM | POA: Diagnosis not present

## 2019-12-11 DIAGNOSIS — F331 Major depressive disorder, recurrent, moderate: Secondary | ICD-10-CM | POA: Diagnosis not present

## 2019-12-24 ENCOUNTER — Ambulatory Visit
Admission: RE | Admit: 2019-12-24 | Discharge: 2019-12-24 | Disposition: A | Discharge status: Home / Self Care | Payer: PPO | Source: Ambulatory Visit | Attending: Internal Medicine | Admitting: Internal Medicine

## 2019-12-24 ENCOUNTER — Other Ambulatory Visit: Payer: Self-pay

## 2019-12-24 DIAGNOSIS — Z1231 Encounter for screening mammogram for malignant neoplasm of breast: Secondary | ICD-10-CM | POA: Diagnosis not present

## 2019-12-30 DIAGNOSIS — M47816 Spondylosis without myelopathy or radiculopathy, lumbar region: Secondary | ICD-10-CM | POA: Diagnosis not present

## 2020-01-06 DIAGNOSIS — F5105 Insomnia due to other mental disorder: Secondary | ICD-10-CM | POA: Diagnosis not present

## 2020-01-06 DIAGNOSIS — F331 Major depressive disorder, recurrent, moderate: Secondary | ICD-10-CM | POA: Diagnosis not present

## 2020-01-08 ENCOUNTER — Ambulatory Visit (INDEPENDENT_AMBULATORY_CARE_PROVIDER_SITE_OTHER): Payer: Self-pay | Admitting: Family Medicine

## 2020-01-21 DIAGNOSIS — D490 Neoplasm of unspecified behavior of digestive system: Secondary | ICD-10-CM | POA: Diagnosis not present

## 2020-01-21 DIAGNOSIS — R140 Abdominal distension (gaseous): Secondary | ICD-10-CM | POA: Diagnosis not present

## 2020-01-21 DIAGNOSIS — K5909 Other constipation: Secondary | ICD-10-CM | POA: Diagnosis not present

## 2020-01-21 DIAGNOSIS — M75111 Incomplete rotator cuff tear or rupture of right shoulder, not specified as traumatic: Secondary | ICD-10-CM | POA: Diagnosis not present

## 2020-01-21 DIAGNOSIS — K449 Diaphragmatic hernia without obstruction or gangrene: Secondary | ICD-10-CM | POA: Diagnosis not present

## 2020-01-21 DIAGNOSIS — R143 Flatulence: Secondary | ICD-10-CM | POA: Diagnosis not present

## 2020-01-21 DIAGNOSIS — K219 Gastro-esophageal reflux disease without esophagitis: Secondary | ICD-10-CM | POA: Diagnosis not present

## 2020-01-22 ENCOUNTER — Ambulatory Visit (INDEPENDENT_AMBULATORY_CARE_PROVIDER_SITE_OTHER): Payer: Self-pay | Admitting: Family Medicine

## 2020-01-29 ENCOUNTER — Ambulatory Visit (INDEPENDENT_AMBULATORY_CARE_PROVIDER_SITE_OTHER): Payer: PPO | Admitting: Family Medicine

## 2020-01-29 ENCOUNTER — Encounter (INDEPENDENT_AMBULATORY_CARE_PROVIDER_SITE_OTHER): Payer: Self-pay | Admitting: Family Medicine

## 2020-01-29 ENCOUNTER — Other Ambulatory Visit: Payer: Self-pay

## 2020-01-29 VITALS — BP 144/86 | HR 64 | Temp 97.6°F | Ht 65.0 in | Wt 189.0 lb

## 2020-01-29 DIAGNOSIS — E538 Deficiency of other specified B group vitamins: Secondary | ICD-10-CM | POA: Insufficient documentation

## 2020-01-29 DIAGNOSIS — I10 Essential (primary) hypertension: Secondary | ICD-10-CM

## 2020-01-29 DIAGNOSIS — E669 Obesity, unspecified: Secondary | ICD-10-CM | POA: Diagnosis not present

## 2020-01-29 DIAGNOSIS — R739 Hyperglycemia, unspecified: Secondary | ICD-10-CM | POA: Diagnosis not present

## 2020-01-29 DIAGNOSIS — D508 Other iron deficiency anemias: Secondary | ICD-10-CM | POA: Diagnosis not present

## 2020-01-29 DIAGNOSIS — R0602 Shortness of breath: Secondary | ICD-10-CM | POA: Diagnosis not present

## 2020-01-29 DIAGNOSIS — Z6831 Body mass index (BMI) 31.0-31.9, adult: Secondary | ICD-10-CM

## 2020-01-29 DIAGNOSIS — F3289 Other specified depressive episodes: Secondary | ICD-10-CM | POA: Diagnosis not present

## 2020-01-29 DIAGNOSIS — F339 Major depressive disorder, recurrent, unspecified: Secondary | ICD-10-CM | POA: Diagnosis not present

## 2020-01-29 DIAGNOSIS — E039 Hypothyroidism, unspecified: Secondary | ICD-10-CM | POA: Diagnosis not present

## 2020-01-29 DIAGNOSIS — D649 Anemia, unspecified: Secondary | ICD-10-CM | POA: Diagnosis not present

## 2020-01-29 DIAGNOSIS — E559 Vitamin D deficiency, unspecified: Secondary | ICD-10-CM

## 2020-01-29 DIAGNOSIS — E7849 Other hyperlipidemia: Secondary | ICD-10-CM

## 2020-01-29 DIAGNOSIS — E7841 Elevated Lipoprotein(a): Secondary | ICD-10-CM | POA: Insufficient documentation

## 2020-01-29 DIAGNOSIS — R5383 Other fatigue: Secondary | ICD-10-CM

## 2020-01-29 DIAGNOSIS — Z0289 Encounter for other administrative examinations: Secondary | ICD-10-CM

## 2020-01-29 DIAGNOSIS — Z9884 Bariatric surgery status: Secondary | ICD-10-CM | POA: Diagnosis not present

## 2020-01-30 LAB — VITAMIN B12: Vitamin B-12: >2000 pg/mL — ABNORMAL HIGH (ref 232–1245)

## 2020-01-30 LAB — HEMOGLOBIN A1C
Est. average glucose Bld gHb Est-mCnc: 111 mg/dL
Hgb A1c MFr Bld: 5.5 % (ref 4.8–5.6)

## 2020-01-30 LAB — VITAMIN D 25 HYDROXY (VIT D DEFICIENCY, FRACTURES): Vit D, 25-Hydroxy: 32.6 ng/mL (ref 30.0–100.0)

## 2020-01-30 LAB — FOLATE: Folate: 19.1 ng/mL (ref >3.0)

## 2020-01-30 LAB — INSULIN, RANDOM: INSULIN: 5.5 u[IU]/mL (ref 2.6–24.9)

## 2020-02-02 DIAGNOSIS — F331 Major depressive disorder, recurrent, moderate: Secondary | ICD-10-CM | POA: Diagnosis not present

## 2020-02-02 DIAGNOSIS — F5105 Insomnia due to other mental disorder: Secondary | ICD-10-CM | POA: Diagnosis not present

## 2020-02-03 NOTE — Progress Notes (Signed)
Chief Complaint:   OBESITY Barbara Mclaughlin (MR# [id number]) is a 69 y.o. female who presents for evaluation and treatment of obesity and related comorbidities. Current BMI is Body mass index is 31.45 kg/m. Barbara Mclaughlin has been struggling with her weight for many years and has been unsuccessful in either losing weight, maintaining weight loss, or reaching her healthy weight goal.  Barbara Mclaughlin is currently in the action stage of change and ready to dedicate time achieving and maintaining a healthier weight. Barbara Mclaughlin is interested in becoming our patient and working on intensive lifestyle modifications including (but not limited to) diet and exercise for weight loss.  Barbara Mclaughlin lives with her husband, [name].  Barbara Mclaughlin's habits were reviewed today and are as follows: Her family eats meals together, she thinks her family will eat healthier with her, she struggles with family and or coworkers weight loss sabotage, her desired weight loss is 33 pounds, she has been heavy most of her life, she started gaining weight in her 90s, her heaviest weight ever was 240 pounds, she craves sweets and chocolate, she snacks frequently in the evenings, she wakes up frequently in the middle of the night to eat, she is frequently drinking liquids with calories and she struggles with emotional eating.  Depression Screen Barbara Mclaughlin's Food and Mood (modified PHQ-9) score was 22.  Depression screen [name] 2/9 01/29/2020  Decreased Interest 3  Down, Depressed, Hopeless 3  PHQ - 2 Score 6  Altered sleeping 3  Tired, decreased energy 3  Change in appetite 2  Feeling bad or failure about yourself  3  Trouble concentrating 1  Moving slowly or fidgety/restless 2  Suicidal thoughts 2  PHQ-9 Score 22  Difficult doing work/chores -   Subjective:   1. Other fatigue Barbara Mclaughlin denies daytime somnolence and admits to waking up still tired. Patent has a history of symptoms of morning fatigue and snoring. Barbara Mclaughlin  generally gets 8-10 hours of sleep per night, and states that she has generally restful sleep. Snoring is present. Apneic episodes are not present. Epworth Sleepiness Score is 4.  2. Shortness of breath on exertion Barbara Mclaughlin notes increasing shortness of breath with exercising and seems to be worsening over time with weight gain. She notes getting out of breath sooner with activity than she used to. This has gotten worse recently. Barbara Mclaughlin denies shortness of breath at rest or orthopnea.  3. S/P gastric bypass In 2009 in [place] by Dr. Vilma Prader.  She got down to 162 pounds around 10 months after surgery.  4. Essential hypertension Review: taking medications as instructed, no medication side effects noted, no chest pain on exertion, no dyspnea on exertion, no swelling of ankles.  She is on Norvasc, Coreg, and losartan.  Her blood pressure at home runs 13s/70s-80s.  She did not take her blood pressure pills this morning.  BP Readings from Last 3 Encounters:  01/29/20 (!) 144/86  12/23/17 132/77  09/10/17 137/69   5. Hypothyroidism, unspecified type She is taking levothyroxine 100 mcg daily.  Thyroid lab on 11/06/2019 showed that her TSH was within normal limits.   Lab Results  Component Value Date   TSH 5.74 (H) 07/06/2015   6. Other iron deficiency anemia Barbara Mclaughlin is not a vegetarian.  She has a history of weight loss surgery.  Hgb 11.5 on 11/17/2019.  CBC Latest Ref Rng & Units 12/23/2017 10/30/2016 06/04/2015  WBC 3.6 - 11.0 K/uL 7.6 6.4 5.8  Hemoglobin 12.0 - 16.0 g/dL 12.4 13.2 12.8  Hematocrit  35 - 47 % 35.6 37.7 38.1  Platelets 150 - 440 K/uL 356 374 308.0   Lab Results  Component Value Date   IRON 89 04/09/2014   TIBC 334 04/09/2014   FERRITIN 40.7 08/11/2014   Lab Results  Component Value Date   VITAMINB12 >2000 (H) 01/29/2020   7. Other hyperlipidemia Barbara Mclaughlin has hyperlipidemia and has been trying to improve her cholesterol levels with intensive lifestyle  modification including a low saturated fat diet, exercise and weight loss. She denies any chest pain, claudication or myalgias.  She had labs one on 11/17/2019.  LDL 130, TG 103, HDL 57.  Lab Results  Component Value Date   ALT 21 10/30/2016   AST 29 10/30/2016   ALKPHOS 62 10/30/2016   BILITOT 0.3 10/30/2016   Lab Results  Component Value Date   CHOL 186 08/11/2014   HDL 52.70 08/11/2014   LDLCALC 108 (H) 08/11/2014   TRIG 125.0 08/11/2014   CHOLHDL 4 08/11/2014   8. Vitamin D deficiency, unspecified She is currently taking OTC vitamin D 2,000 IU each day. She denies nausea, vomiting or muscle weakness.  She endorses fatigue.  9. B12 deficiency She notes fatigue. She is not a vegetarian.  She does not have a previous diagnosis of pernicious anemia.  She has a history of weight loss surgery.  She endorses tiredness and fatigue.  Lab Results  Component Value Date   VITAMINB12 >2000 (H) 01/29/2020   10. Other depression, with emotional eating Barbara Mclaughlin is struggling with emotional eating and using food for comfort to the extent that it is negatively impacting her health. She has been working on behavior modification techniques to help reduce her emotional eating and has been unsuccessful. She shows no sign of suicidal or homicidal ideations.  She sees Dr. Nicolasa Ducking for psychiatric care.  Denies SI/HI.  She takes Seroquel and Wellbutrin 75 mg twice daily.  Also Prozac 20 mg daily.  She has a flat affect today.  This is the patient's first visit at [facility].  The patient's NEW PATIENT PACKET that they filled out prior to today's office visit was reviewed at length and some information from that paperwork was also included within the following office visit note.    Included in the packet: current and past health history, medications, allergies, ROS, gynecologic history (women only), surgical history, family history, social history, weight history, weight loss surgery history  (for those that have had weight loss surgery), nutritional evaluation, mood and food questionnaire along with a depression screening (PHQ9) on all patients, an Epworth questionnaire, sleep habits questionnaire, patient life and health improvement goals questionnaire. These will all be scanned into the patient's chart under media.   During the visit, I independently reviewed the patient's EKG, bioimpedance scale results, and indirect calorimeter results. I used this information to tailor a meal plan for the patient that will help Silveria to lose weight and will improve her obesity-related conditions going forward. I performed a medically necessary appropriate examination and/or evaluation. I discussed the assessment and treatment plan with the patient. The patient was provided an opportunity to ask questions and all were answered. The patient agreed with the plan and demonstrated an understanding of the instructions. Labs were ordered at this visit and will be reviewed at the next visit unless more critical results need to be addressed immediately. Clinical information was updated and documented in the EMR.   Time spent on visit including pre-visit chart review and post-visit care was estimated  to be 60-74 minutes.  A separate 15 minutes was spent on risk counseling (see above/below).   Assessment/Plan:   1. Other fatigue Makenzee does feel that her weight is causing her energy to be lower than it should be. Fatigue may be related to obesity, depression or many other causes. Labs will be ordered, and in the meanwhile, Ralphine will focus on self care including making healthy food choices, increasing physical activity and focusing on stress reduction.   - EKG 12-Lead - Vitamin B12 - Folate - Hemoglobin A1c - Insulin, random - VITAMIN D 25 Hydroxy (Vit-D Deficiency, Fractures)  2. Shortness of breath on exertion Jaclynne does feel that she gets out of breath more easily that she used to when  she exercises. Savon's shortness of breath appears to be obesity related and exercise induced. She has agreed to work on weight loss and gradually increase exercise to treat her exercise induced shortness of breath. Will continue to monitor closely.  3. S/P gastric bypass Janis is at risk for malnutrition due to her previous bariatric surgery.   Counseling  You may need to eat 3 meals and 2 snacks, or 5 small meals each day in order to reach your protein and calorie goals.   Allow at least 15 minutes for each meal so that you can eat mindfully. Listen to your body so that you do not overeat. For most people, your sleeve or pouch will comfortably hold 4-6 ounces.  Eat foods from all food groups. This includes fruits and vegetables, grains, dairy, and meat and other proteins.  Include a protein-rich food at every meal and snack, and eat the protein food first.   You should be taking a Bariatric Multivitamin as well as calcium.   - Vitamin B12 - Folate - Hemoglobin A1c - Insulin, random - VITAMIN D 25 Hydroxy (Vit-D Deficiency, Fractures)  4. Essential hypertension Randal is working on healthy weight loss and exercise to improve blood pressure control. We will watch for signs of hypotension as she continues her lifestyle modifications.  5. Hypothyroidism, unspecified type Patient with long-standing hypothyroidism, on levothyroxine therapy. She appears euthyroid. Orders and follow up as documented in patient record.  Continue medications.  Will continue to follow.  Counseling . Good thyroid control is important for overall health. Supratherapeutic thyroid levels are dangerous and will not improve weight loss results. . The correct way to take levothyroxine is fasting, with water, separated by at least 30 minutes from breakfast, and separated by more than 4 hours from calcium, iron, multivitamins, acid reflux medications (PPIs).   6. Other iron deficiency anemia Orders and  follow up as documented in patient record.  Continue to monitor alongside PCP.  Counseling . Iron is essential for our bodies to make red blood cells.  Reasons that someone may be deficient include: an iron-deficient diet (more likely in those following vegan or vegetarian diets), women with heavy menses, patients with GI disorders or poor absorption, patients that have had bariatric surgery, frequent blood donors, patients with cancer, and patients with heart disease.   [place] foods include dark leafy greens, red and white meats, eggs, seafood, and beans.   . Certain foods and drinks prevent your body from absorbing iron properly. Avoid eating these foods in the same meal as iron-rich foods or with iron supplements. These foods include: coffee, black tea, and red wine; milk, dairy products, and foods that are high in calcium; beans and soybeans; whole grains.  . Constipation can be a  side effect of iron supplementation. Increased water and fiber intake are helpful. Water goal: > 2 liters/day. Fiber goal: > 25 grams/day.  - Vitamin B12 - Folate  7. Other hyperlipidemia Cardiovascular risk and specific lipid/LDL goals reviewed.  We discussed several lifestyle modifications today and Torri will continue to work on diet, exercise and weight loss efforts. Orders and follow up as documented in patient record.  Weight loss, prudent nutritional plan, low salt/trans fat diet.  Counseling Intensive lifestyle modifications are the first line treatment for this issue. . Dietary changes: Increase soluble fiber. Decrease simple carbohydrates. . Exercise changes: Moderate to vigorous-intensity aerobic activity 150 minutes per week if tolerated. . Lipid-lowering medications: see documented in medical record.  8. Vitamin D deficiency, unspecified Low Vitamin D level contributes to fatigue and are associated with obesity, breast, and colon cancer. She agrees to continue to take OTC Vitamin D @2 ,000 IU  daily and will follow-up for routine testing of Vitamin D, at least 2-3 times per year to avoid over-replacement.  Check vitamin D level today.  - VITAMIN D 25 Hydroxy (Vit-D Deficiency, Fractures)  9. B12 deficiency The diagnosis was reviewed with the patient. Counseling provided today, see below. We will continue to monitor. Orders and follow up as documented in patient record.  Check B12 level today.  Counseling . The body needs vitamin B12: to make red blood cells; to make DNA; and to help the nerves work properly so they can carry messages from the brain to the body.  . The main causes of vitamin B12 deficiency include dietary deficiency, digestive diseases, pernicious anemia, and having a surgery in which part of the stomach or small intestine is removed.  . Certain medicines can make it harder for the body to absorb vitamin B12. These medicines include: heartburn medications; some antibiotics; some medications used to treat diabetes, gout, and high cholesterol.  . In some cases, there are no symptoms of this condition. If the condition leads to anemia or nerve damage, various symptoms can occur, such as weakness or fatigue, shortness of breath, and numbness or tingling in your hands and feet.   . Treatment:  o May include taking vitamin B12 supplements.  o Avoid alcohol.  o Eat lots of healthy foods that contain vitamin B12: - Beef, pork, chicken, [place], and organ meats, such as liver.  - Seafood: This includes clams, rainbow trout, salmon, tuna, and haddock. Eggs.  - Cereal and dairy products that are fortified: This means that vitamin B12 has been added to the food.   - Vitamin B12  10. Other depression, with emotional eating Behavior modification techniques were discussed today to help Shawnna deal with her emotional/non-hunger eating behaviors.  Orders and follow up as documented in patient record.  Continue treatment with psychiatrist.  She will call to schedule CBT/therapy  session with counselor in hopes to help her feel better.  11. Class 1 obesity with serious comorbidity and body mass index (BMI) of 31.0 to 31.9 in adult, unspecified obesity type  Landree is currently in the action stage of change and her goal is to continue with weight loss efforts. I recommend Iyanni begin the structured treatment plan as follows:  She has agreed to the Category 1 Plan.  Exercise goals: As is.   Behavioral modification strategies: increasing lean protein intake, increasing water intake, no skipping meals, meal planning and cooking strategies and planning for success.  She was informed of the importance of frequent follow-up visits to maximize her success  with intensive lifestyle modifications for her multiple health conditions. She was informed we would discuss her lab results at her next visit unless there is a critical issue that needs to be addressed sooner. Lakeshia agreed to keep her next visit at the agreed upon time to discuss these results.  Objective:   Blood pressure (!) 144/86, pulse 64, temperature 97.6 F (36.4 C), height 5\' 5"  (1.651 m), weight 189 lb (85.7 kg), SpO2 98 %. Body mass index is 31.45 kg/m.  EKG: Normal sinus rhythm, rate 67 bpm.  Indirect Calorimeter completed today shows a VO2 of 239 and a REE of 1664.  Her calculated basal metabolic rate is 8841 thus her basal metabolic rate is better than expected.  General: Cooperative, alert, well developed, in no acute distress. HEENT: Conjunctivae and lids unremarkable. Cardiovascular: Regular rhythm.  Lungs: Normal work of breathing. Neurologic: No focal deficits.   Lab Results  Component Value Date   CREATININE 0.80 06/30/2019   BUN 20 12/23/2017   NA 133 (L) 12/23/2017   K 4.3 12/23/2017   CL 99 12/23/2017   CO2 26 12/23/2017   Lab Results  Component Value Date   ALT 21 10/30/2016   AST 29 10/30/2016   ALKPHOS 62 10/30/2016   BILITOT 0.3 10/30/2016   Lab Results  Component  Value Date   HGBA1C 5.5 01/29/2020   Lab Results  Component Value Date   INSULIN 5.5 01/29/2020   Lab Results  Component Value Date   TSH 5.74 (H) 07/06/2015   Lab Results  Component Value Date   CHOL 186 08/11/2014   HDL 52.70 08/11/2014   LDLCALC 108 (H) 08/11/2014   TRIG 125.0 08/11/2014   CHOLHDL 4 08/11/2014   Lab Results  Component Value Date   WBC 7.6 12/23/2017   HGB 12.4 12/23/2017   HCT 35.6 12/23/2017   MCV 92.4 12/23/2017   PLT 356 12/23/2017   Lab Results  Component Value Date   IRON 89 04/09/2014   TIBC 334 04/09/2014   FERRITIN 40.7 08/11/2014   Obesity Behavioral Intervention:   Approximately 15 minutes were spent on the discussion below.  ASK: We discussed the diagnosis of obesity with [place] today and Marissa agreed to give [place] permission to discuss obesity behavioral modification therapy today.  ASSESS: Sandhya has the diagnosis of obesity and her BMI today is 31.6. Azrielle is in the action stage of change.   ADVISE: Arayla was educated on the multiple health risks of obesity as well as the benefit of weight loss to improve her health. She was advised of the need for long term treatment and the importance of lifestyle modifications to improve her current health and to decrease her risk of future health problems.  AGREE: Multiple dietary modification options and treatment options were discussed and Anjuli agreed to follow the recommendations documented in the above note.  ARRANGE: Han was educated on the importance of frequent visits to treat obesity as outlined per CMS and USPSTF guidelines and agreed to schedule her next follow up appointment today.  Attestation Statements:   Reviewed by clinician on day of visit: allergies, medications, problem list, medical history, surgical history, family history, social history, and previous encounter notes.  I, Water quality scientist, CMA, am acting as Location manager for [employer],  DO.  I have reviewed the above documentation for accuracy and completeness, and I agree with the above. Mellody Dance, DO

## 2020-02-12 ENCOUNTER — Encounter (INDEPENDENT_AMBULATORY_CARE_PROVIDER_SITE_OTHER): Payer: Self-pay | Admitting: Family Medicine

## 2020-02-12 ENCOUNTER — Ambulatory Visit (INDEPENDENT_AMBULATORY_CARE_PROVIDER_SITE_OTHER): Payer: PPO | Admitting: Family Medicine

## 2020-02-12 ENCOUNTER — Other Ambulatory Visit: Payer: Self-pay

## 2020-02-12 VITALS — BP 128/82 | HR 64 | Temp 98.0°F | Ht 65.0 in | Wt 188.0 lb

## 2020-02-12 DIAGNOSIS — E538 Deficiency of other specified B group vitamins: Secondary | ICD-10-CM

## 2020-02-12 DIAGNOSIS — I10 Essential (primary) hypertension: Secondary | ICD-10-CM

## 2020-02-12 DIAGNOSIS — E8881 Metabolic syndrome: Secondary | ICD-10-CM | POA: Diagnosis not present

## 2020-02-12 DIAGNOSIS — Z6831 Body mass index (BMI) 31.0-31.9, adult: Secondary | ICD-10-CM

## 2020-02-12 DIAGNOSIS — E669 Obesity, unspecified: Secondary | ICD-10-CM | POA: Diagnosis not present

## 2020-02-12 DIAGNOSIS — E559 Vitamin D deficiency, unspecified: Secondary | ICD-10-CM | POA: Diagnosis not present

## 2020-02-12 MED ORDER — VITAMIN D (ERGOCALCIFEROL) 1.25 MG (50000 UNIT) PO CAPS: 50000.0000 [IU] | ORAL_CAPSULE | ORAL | 0 refills | Status: AC

## 2020-02-12 MED ORDER — B-12 250 MCG PO TABS: 250.0000 ug | ORAL_TABLET | Freq: Every day | ORAL | Status: AC

## 2020-02-16 NOTE — Progress Notes (Signed)
Chief Complaint:   OBESITY Barbara Mclaughlin is here to discuss her progress with her obesity treatment plan along with follow-up of her obesity related diagnoses. Barbara Mclaughlin is on the Category 1 Plan and states she is following her eating plan approximately 30-50% of the time. Barbara Mclaughlin states she is using the stationary bike for 30 minutes 3 times per week.  Today's visit was #: 2 Starting weight: 189 lbs Starting date: 01/29/2020 Today's weight: 188 lbs Today's date: 02/12/2020 Total lbs lost to date: 1 lb Total lbs lost since last in-office visit: 1 lb  Interim History: Barbara Mclaughlin says, "I can't do it.  It's too structured, too restrictive and boring."  She misses butter, mayo, etc.  Her sister has a birthday coming up and she is traveling into town from [place] for 10 days.  Pt doesn't think she will be following the plan during that time   Assessment/Plan:   - Labs of vit D, b12, insulin, A1c, folate were drawn last OV - Laboratory blood tests performed by [place] recently and were reviewed and discussed with pt.  Treatment plan rendered based on those results.  All patient questions answered today and understanding to treatment plan was confirmed.  Moderate complexity.   1. Essential hypertension Discussed labs with patient today.  At goal. Medications: Coreg, Cozaar. We will monitor for hypotension with continued weight loss.   BP Readings from Last 3 Encounters:  02/12/20 128/82  01/29/20 (!) 144/86  12/23/17 132/77   Lab Results  Component Value Date   CREATININE 0.80 06/30/2019   Plan:  Continue medications at current doses.  Continue prudent nutritional plan, weight loss, low salt diet.   2. Insulin resistance New.  Discussed labs with patient today.  Nalani has a diagnosis of insulin resistance based on her elevated fasting insulin level >5. She continues to work on diet and exercise to decrease her risk of diabetes.  She is not on any medications for this.  Lab  Results  Component Value Date   INSULIN 5.5 01/29/2020   Lab Results  Component Value Date   HGBA1C 5.5 01/29/2020   Plan:  Continue prudent nutritional plan and weight loss.  Will continue to monitor.  We will consider medications in future if pt cont to struggle with wt loss.   3. B12 deficiency Worsening.  Discussed labs with patient today.  Barbara Mclaughlin's B12 level is >2000.  She is taking vitamin B12 OTC.  Lab Results  Component Value Date   VITAMINB12 >2000 (H) 01/29/2020   Plan:  Decrease vitamin B12 to 250 mcg daily OTC.  Continue prudent nutritional plan and weight loss.  Reck 3 mo or so   4. Vitamin D deficiency, unspecified Worsening.  Discussed labs with patient today.  Current vitamin D is 32.6, tested on 01/29/2020. Not at goal. Optimal goal > 50 ng/dL. There is also evidence to support a goal of >70 ng/dL in patients with cancer and heart disease.   Plan:  Start Vitamin D @50 ,000 IU every week with follow-up for routine testing of Vitamin D at least 2-3 times per year to avoid over-replacement.  Will check vitamin D level in 3 months.  Meds ordered this encounter  Medications  . Cyanocobalamin (VITAMIN B-12) 250 MCG TABS    Sig: Take 250 mcg by mouth daily.  . Vitamin D, Ergocalciferol, (DRISDOL) 1.25 MG (50000 UNIT) CAPS capsule    Sig: Take 1 capsule (50,000 Units total) by mouth every 7 (seven) days.  Dispense:  4 capsule    Refill:  0    5. Class 1 obesity with serious comorbidity and body mass index (BMI) of 31.0 to 31.9 in adult, unspecified obesity type  Barbara Mclaughlin is currently in the action stage of change. As such, her goal is to continue with weight loss efforts. She has agreed to change her plan from CAT 1 to journaling in order to give her more freedom:  keeping a food journal and adhering to recommended goals of 1100 calories and 80+ grams of protein.   Exercise goals: As is.  Behavioral modification strategies: increasing lean protein intake,  decreasing simple carbohydrates, increasing vegetables, meal planning and cooking strategies and planning for success.  Motivational interviewing as well as evidence-based interventions for health behavior change were utilized today including the discussion of self monitoring techniques, problem-solving barriers and SMART goal setting techniques.   Specifically regarding patient's less desirable eating habits and patterns, we employed the technique of small changes when Barbara Mclaughlin has not been able to fully commit to her prudent nutritional plan.    [facility] has agreed to follow-up with our clinic in 2 weeks.   She was informed of the importance of frequent follow-up visits to maximize her success with intensive lifestyle modifications for her multiple health conditions.   Objective:   Blood pressure 128/82, pulse 64, temperature 98 F (36.7 C), height 5\' 5"  (1.651 m), weight 188 lb (85.3 kg), SpO2 95 %. Body mass index is 31.28 kg/m.  General: Cooperative, alert, well developed, in no acute distress. HEENT: Conjunctivae and lids unremarkable. Cardiovascular: Regular rhythm.  Lungs: Normal work of breathing. Neurologic: No focal deficits.   Lab Results  Component Value Date   CREATININE 0.80 06/30/2019   BUN 20 12/23/2017   NA 133 (L) 12/23/2017   K 4.3 12/23/2017   CL 99 12/23/2017   CO2 26 12/23/2017   Lab Results  Component Value Date   ALT 21 10/30/2016   AST 29 10/30/2016   ALKPHOS 62 10/30/2016   BILITOT 0.3 10/30/2016   Lab Results  Component Value Date   HGBA1C 5.5 01/29/2020   Lab Results  Component Value Date   INSULIN 5.5 01/29/2020   Lab Results  Component Value Date   TSH 5.74 (H) 07/06/2015   Lab Results  Component Value Date   CHOL 186 08/11/2014   HDL 52.70 08/11/2014   LDLCALC 108 (H) 08/11/2014   TRIG 125.0 08/11/2014   CHOLHDL 4 08/11/2014   Lab Results  Component Value Date   WBC 7.6 12/23/2017   HGB 12.4 12/23/2017    HCT 35.6 12/23/2017   MCV 92.4 12/23/2017   PLT 356 12/23/2017   Lab Results  Component Value Date   IRON 89 04/09/2014   TIBC 334 04/09/2014   FERRITIN 40.7 08/11/2014   Obesity Behavioral Intervention:   Approximately 15 minutes were spent on the discussion below.  ASK: We discussed the diagnosis of obesity with [place] today and Gyselle agreed to give [place] permission to discuss obesity behavioral modification therapy today.  ASSESS: Nashla has the diagnosis of obesity and her BMI today is 31.3. Liesl is in the action stage of change.   ADVISE: Ahley was educated on the multiple health risks of obesity as well as the benefit of weight loss to improve her health. She was advised of the need for long term treatment and the importance of lifestyle modifications to improve her current health and to decrease her risk of future health  problems.  AGREE: Multiple dietary modification options and treatment options were discussed and Morgann agreed to follow the recommendations documented in the above note.  ARRANGE: Samanda was educated on the importance of frequent visits to treat obesity as outlined per CMS and USPSTF guidelines and agreed to schedule her next follow up appointment today.  Attestation Statements:   Reviewed by clinician on day of visit: allergies, medications, problem list, medical history, surgical history, family history, social history, and previous encounter notes.  I, Water quality scientist, CMA, am acting as Location manager for [employer], DO.  I have reviewed the above documentation for accuracy and completeness, and I agree with the above. Marjory Sneddon, D.O.  The [place] was signed into law in 2016 which includes the topic of electronic health records.  This provides immediate access to information in MyChart.  This includes consultation notes, operative notes, office notes, lab results and pathology reports.  If you have any  questions about what you read please let us know at your next visit so we can discuss your concerns and take corrective action if need be.  We are right here with you.

## 2020-02-18 ENCOUNTER — Ambulatory Visit (INDEPENDENT_AMBULATORY_CARE_PROVIDER_SITE_OTHER): Payer: PPO | Admitting: Family Medicine

## 2020-03-01 ENCOUNTER — Ambulatory Visit (INDEPENDENT_AMBULATORY_CARE_PROVIDER_SITE_OTHER): Payer: PPO | Admitting: Family Medicine

## 2020-03-02 ENCOUNTER — Other Ambulatory Visit (INDEPENDENT_AMBULATORY_CARE_PROVIDER_SITE_OTHER): Payer: Self-pay | Admitting: Family Medicine

## 2020-03-02 DIAGNOSIS — M5136 Other intervertebral disc degeneration, lumbar region: Secondary | ICD-10-CM | POA: Diagnosis not present

## 2020-03-02 DIAGNOSIS — E559 Vitamin D deficiency, unspecified: Secondary | ICD-10-CM

## 2020-03-02 DIAGNOSIS — M461 Sacroiliitis, not elsewhere classified: Secondary | ICD-10-CM | POA: Diagnosis not present

## 2020-03-02 DIAGNOSIS — M47816 Spondylosis without myelopathy or radiculopathy, lumbar region: Secondary | ICD-10-CM | POA: Diagnosis not present

## 2020-03-26 DIAGNOSIS — E782 Mixed hyperlipidemia: Secondary | ICD-10-CM | POA: Diagnosis not present

## 2020-03-26 DIAGNOSIS — I10 Essential (primary) hypertension: Secondary | ICD-10-CM | POA: Diagnosis not present

## 2020-03-26 DIAGNOSIS — E559 Vitamin D deficiency, unspecified: Secondary | ICD-10-CM | POA: Diagnosis not present

## 2020-03-26 DIAGNOSIS — E890 Postprocedural hypothyroidism: Secondary | ICD-10-CM | POA: Diagnosis not present

## 2020-03-26 DIAGNOSIS — Z79899 Other long term (current) drug therapy: Secondary | ICD-10-CM | POA: Diagnosis not present

## 2020-04-06 DIAGNOSIS — F5105 Insomnia due to other mental disorder: Secondary | ICD-10-CM | POA: Diagnosis not present

## 2020-04-06 DIAGNOSIS — F331 Major depressive disorder, recurrent, moderate: Secondary | ICD-10-CM | POA: Diagnosis not present

## 2020-04-27 DIAGNOSIS — I471 Supraventricular tachycardia: Secondary | ICD-10-CM | POA: Diagnosis not present

## 2020-04-27 DIAGNOSIS — I10 Essential (primary) hypertension: Secondary | ICD-10-CM | POA: Diagnosis not present

## 2020-04-27 DIAGNOSIS — E782 Mixed hyperlipidemia: Secondary | ICD-10-CM | POA: Diagnosis not present

## 2020-05-13 ENCOUNTER — Other Ambulatory Visit: Payer: Self-pay | Admitting: Gastroenterology

## 2020-05-13 DIAGNOSIS — K862 Cyst of pancreas: Secondary | ICD-10-CM | POA: Diagnosis not present

## 2020-05-13 DIAGNOSIS — K5909 Other constipation: Secondary | ICD-10-CM | POA: Diagnosis not present

## 2020-05-13 DIAGNOSIS — K863 Pseudocyst of pancreas: Secondary | ICD-10-CM | POA: Diagnosis not present

## 2020-05-13 DIAGNOSIS — K219 Gastro-esophageal reflux disease without esophagitis: Secondary | ICD-10-CM | POA: Diagnosis not present

## 2020-05-20 ENCOUNTER — Ambulatory Visit
Admission: RE | Admit: 2020-05-20 | Discharge: 2020-05-20 | Disposition: A | Discharge status: Home / Self Care | Payer: PPO | Source: Ambulatory Visit | Attending: Gastroenterology | Admitting: Gastroenterology

## 2020-05-20 ENCOUNTER — Other Ambulatory Visit: Payer: Self-pay

## 2020-05-20 DIAGNOSIS — K862 Cyst of pancreas: Secondary | ICD-10-CM | POA: Diagnosis not present

## 2020-05-20 DIAGNOSIS — K863 Pseudocyst of pancreas: Secondary | ICD-10-CM | POA: Insufficient documentation

## 2020-05-20 DIAGNOSIS — N281 Cyst of kidney, acquired: Secondary | ICD-10-CM | POA: Diagnosis not present

## 2020-05-20 MED ORDER — GADOBUTROL 1 MMOL/ML IV SOLN
8.0000 mL | Freq: Once | INTRAVENOUS | Status: AC | PRN
  Administered 2020-05-20: 8 mL via INTRAVENOUS

## 2020-06-25 DIAGNOSIS — F331 Major depressive disorder, recurrent, moderate: Secondary | ICD-10-CM | POA: Diagnosis not present

## 2020-06-25 DIAGNOSIS — F5105 Insomnia due to other mental disorder: Secondary | ICD-10-CM | POA: Diagnosis not present

## 2020-07-06 DIAGNOSIS — M75111 Incomplete rotator cuff tear or rupture of right shoulder, not specified as traumatic: Secondary | ICD-10-CM | POA: Diagnosis not present

## 2020-07-07 DIAGNOSIS — L821 Other seborrheic keratosis: Secondary | ICD-10-CM | POA: Diagnosis not present

## 2020-07-07 DIAGNOSIS — L720 Epidermal cyst: Secondary | ICD-10-CM | POA: Diagnosis not present

## 2020-07-07 DIAGNOSIS — Z86018 Personal history of other benign neoplasm: Secondary | ICD-10-CM | POA: Diagnosis not present

## 2020-07-07 DIAGNOSIS — L578 Other skin changes due to chronic exposure to nonionizing radiation: Secondary | ICD-10-CM | POA: Diagnosis not present

## 2020-07-07 DIAGNOSIS — L570 Actinic keratosis: Secondary | ICD-10-CM | POA: Diagnosis not present

## 2020-07-07 DIAGNOSIS — Z872 Personal history of diseases of the skin and subcutaneous tissue: Secondary | ICD-10-CM | POA: Diagnosis not present

## 2020-07-07 DIAGNOSIS — Z85828 Personal history of other malignant neoplasm of skin: Secondary | ICD-10-CM | POA: Diagnosis not present

## 2020-07-10 IMAGING — MR MR ABDOMEN WO/W CM MRCP
17 of 21 series · 37 of 48 positions shown · IV contrast (7.5ml Gadavist)
Comparison: 10/24/2018 MRI abdomen.

CLINICAL DATA: Follow-up cystic pancreatic lesion.

EXAM:
MRI ABDOMEN WITHOUT AND WITH CONTRAST (INCLUDING MRCP)
TECHNIQUE: Multiplanar multisequence MR imaging of the abdomen was performed
both before and after the administration of intravenous contrast.
Heavily T2-weighted images of the biliary and pancreatic ducts were
obtained, and three-dimensional MRCP images were rendered by post
processing.
CONTRAST:  7.5mL GADAVIST GADOBUTROL 1 MMOL/ML IV SOLN

[Series 3: T2 · coronal · 6.0mm · 1.25mm/px · 2 of 33 slices shown (1 of 2)]
[im 1/33]
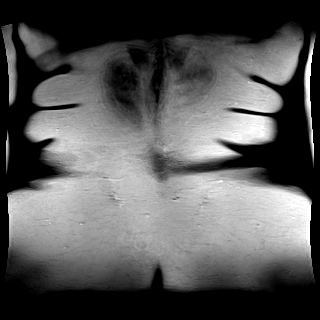
[im 33/33]
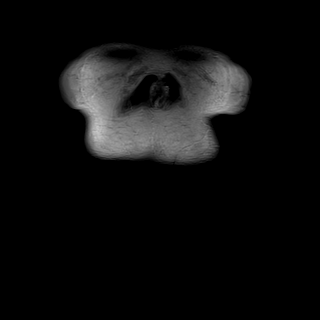

[Series 4: T2 · axial · 6.0mm · 1.19mm/px · z∈[-6,+217]mm · 2 of 32 slices shown (2 of 2)]
[im 1/32]
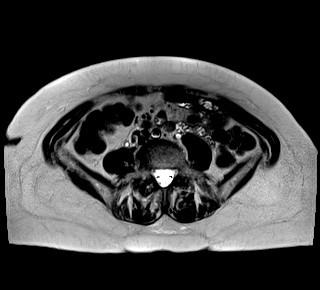
[im 32/32]
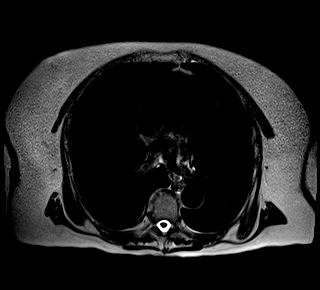

[Series 5: T1 · axial · 6.0mm · 0.74mm/px · 1 of 32 slices shown (1 of 2)]
[im 1/32]
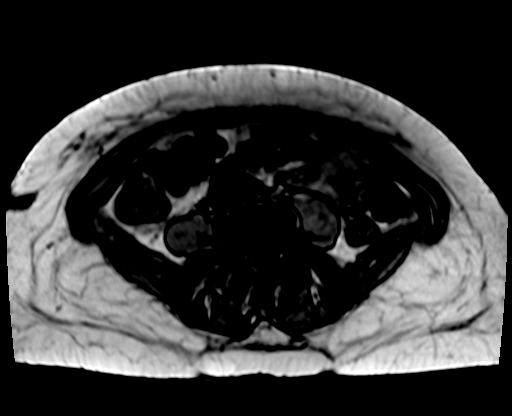

[Series 5: T1 · axial · 6.0mm · 0.74mm/px · 1 of 32 slices shown (2 of 2)]
[im 1/32]
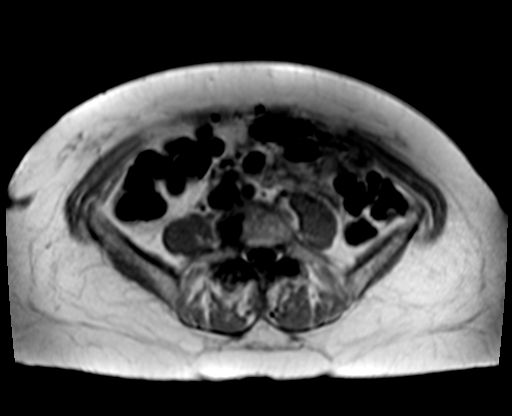

[Series 6: ax dwi_tracew · axial · 6.0mm · 1.42mm/px · z∈[+5,+229]mm · 4 of 96 slices shown]
[im 1/96]
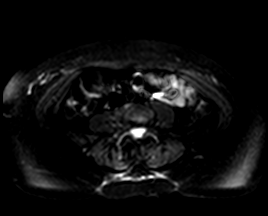
[im 32/96]
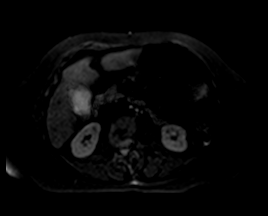
[im 64/96]
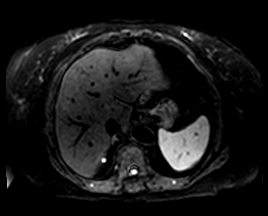
[im 96/96]
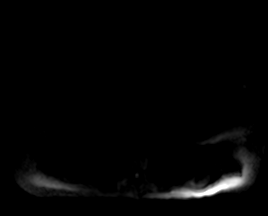

[Series 7: ax dwi_adc · axial · 6.0mm · 1.42mm/px · 1 of 32 slices shown]
[im 1/32]
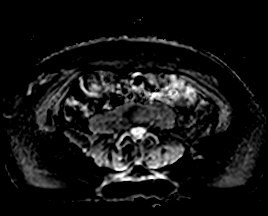

[Series 10: T2 fat-sat · axial · 6.0mm · 1.19mm/px · 1 of 32 slices shown]
[im 1/32]
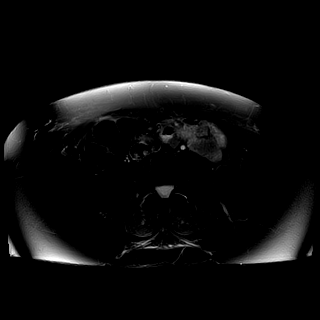

[Series 11: MRCP · coronal · 3.0mm · 1.12mm/px · 1 of 23 slices shown]
[im 1/23]
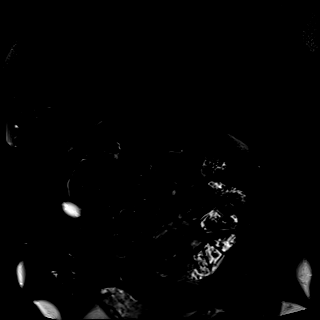

[Series 13: t2_space_cor_cs20_trig_384_iso · coronal · 1.0mm · 0.49mm/px · 3 of 80 slices shown]
[im 1/80]
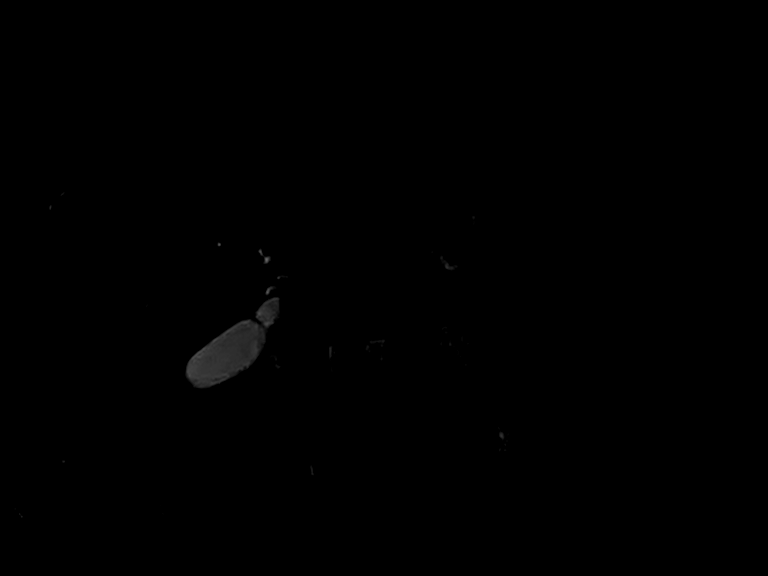
[im 40/80]
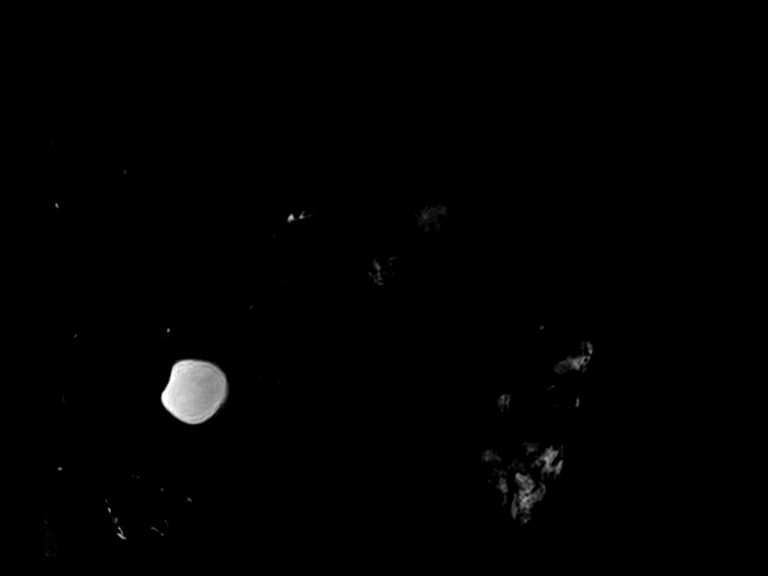
[im 80/80]
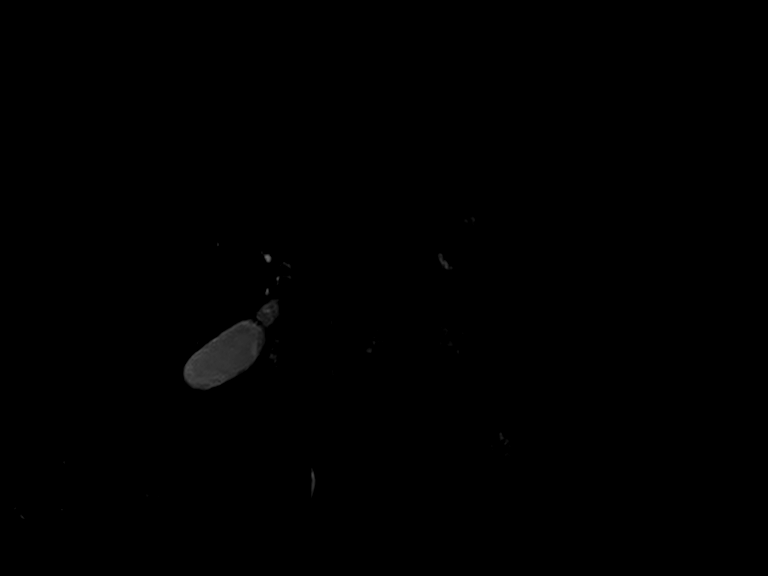

[Series 15: radials · coronal · 50.0mm · 0.78mm/px · 1 of 5 slices shown]
[im 1/5]
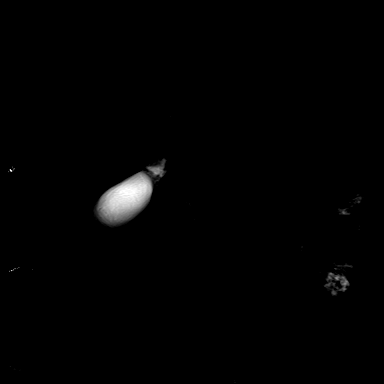

[Series 16: T1 dynamic fat-sat · axial · non-contrast · 3.0mm · 1.19mm/px · z∈[-13,+224]mm · 3 of 80 slices shown (1 of 4)]
[im 1/80]
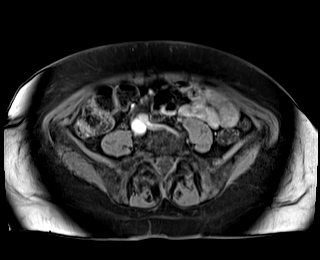
[im 40/80]
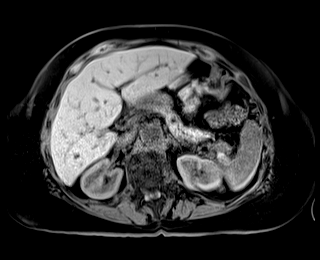
[im 80/80]
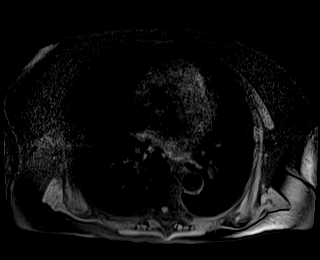

[Series 17: T1 dynamic fat-sat post-contrast · axial · 3.0mm · 1.19mm/px · z∈[-13,+224]mm · 3 of 80 slices shown (1 of 3)]
[im 1/80]
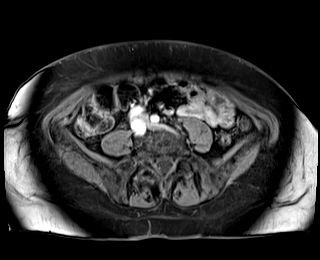
[im 40/80]
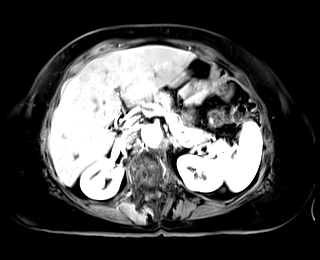
[im 80/80]
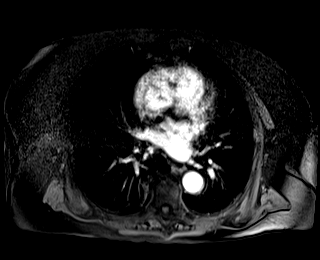

[Series 18: T1 dynamic fat-sat · axial · 3.0mm · 1.19mm/px · z∈[-13,+224]mm · 3 of 80 slices shown (2 of 4)]
[im 1/80]
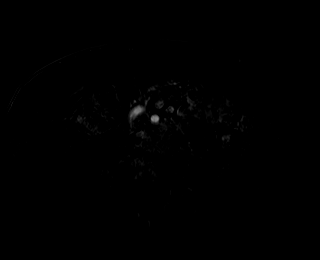
[im 40/80]
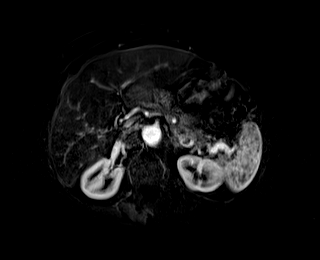
[im 80/80]
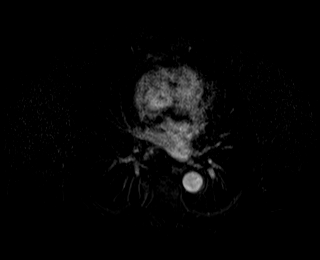

[Series 19: T1 dynamic fat-sat post-contrast · axial · 3.0mm · 1.19mm/px · z∈[-13,+224]mm · 3 of 80 slices shown (2 of 3)]
[im 1/80]
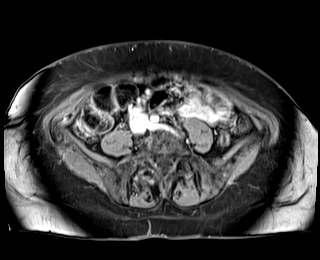
[im 40/80]
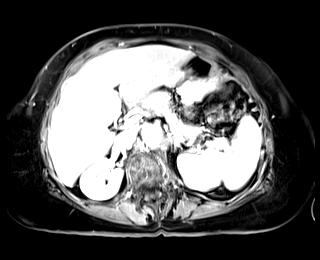
[im 80/80]
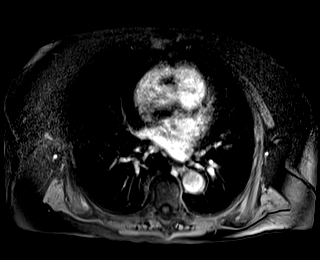

[Series 20: T1 dynamic fat-sat · axial · 3.0mm · 1.19mm/px · z∈[-13,+224]mm · 3 of 80 slices shown (3 of 4)]
[im 1/80]
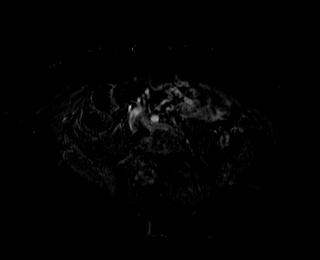
[im 40/80]
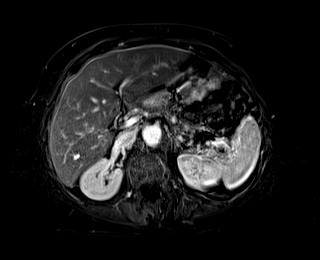
[im 80/80]
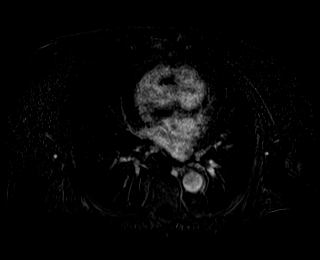

[Series 21: T1 dynamic fat-sat post-contrast · axial · 3.0mm · 1.19mm/px · z∈[-13,+224]mm · 3 of 80 slices shown (3 of 3)]
[im 1/80]
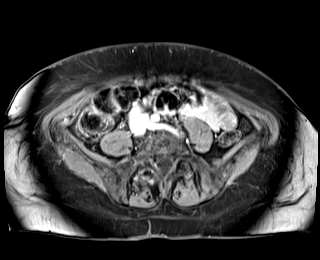
[im 40/80]
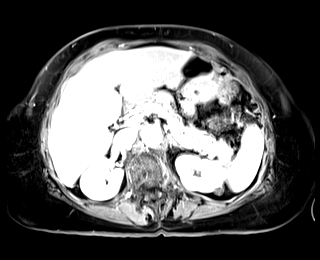
[im 80/80]
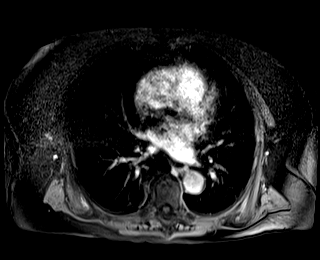

[Series 22: T1 dynamic fat-sat · axial · 3.0mm · 1.19mm/px · z∈[-13,+104]mm · 2 of 80 slices shown (4 of 4)]
[im 1/80]
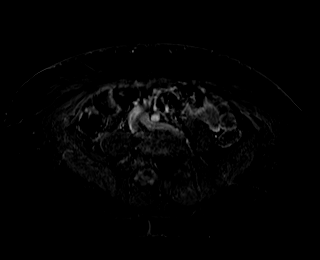
[im 40/80]
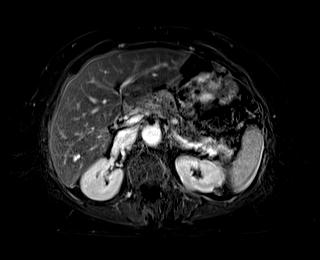

[37 of 48 positions shown; findings below may reference images not displayed]

FINDINGS: Lower chest: No acute abnormality at the lung bases.

Hepatobiliary: Normal liver size and configuration. No hepatic
steatosis. No liver mass. Normal gallbladder with no cholelithiasis.
No biliary ductal dilatation. Common bile duct diameter 4 mm. No
choledocholithiasis. No biliary masses, strictures or beading.

Pancreas: Cystic 2.6 x 1.2 x 2.4 cm pancreatic head mass (series
10/image 25) with lobulated outer contour and thin internal
septations, with direct communication with main pancreatic duct,
without wall thickening or solid enhancement, previously 2.7 x 1.8 x
2.0 cm on 10/24/2018 and 2.6 x 1.2 x 1.9 cm on 11/02/2016 MRI using
similar measurement technique, not substantially changed. No new
pancreatic lesions. No significant pancreatic duct dilation (2-3 mm
diameter). No pancreas divisum.

Spleen: Normal size. No mass.

Adrenals/Urinary Tract: Normal adrenals. No hydronephrosis.
Exophytic 1.3 cm renal cortical lesion in the lateral upper left
kidney (series 4/image 17) with mild heterogeneous precontrast T1
hyperintensity and no appreciable enhancement on the motion degraded
postcontrast sequences, compatible with Bosniak category 2
hemorrhagic/proteinaceous renal cyst. Exophytic subcentimeter renal
cortical lesion in the upper right kidney (series 4/image 10) with
mild precontrast T1 hyperintensity and no appreciable enhancement,
also compatible with Bosniak category 2 hemorrhagic/proteinaceous
renal cyst.

Stomach/Bowel: Small moderate hiatal hernia. Otherwise normal
nondistended stomach. Visualized small and large bowel is normal
caliber, with no bowel wall thickening.

Vascular/Lymphatic: Normal caliber abdominal aorta. Patent portal,
splenic, hepatic and renal veins. No pathologically enlarged lymph
nodes in the abdomen.

Other: No abdominal ascites or focal fluid collection.

Musculoskeletal: No aggressive appearing focal osseous lesions.
IMPRESSION: 1. Continued stability of cystic 2.6 cm pancreatic head mass with
direct communication with pancreatic duct, without high risk MRI
features, compatible with side branch IPMN. No biliary or pancreatic
duct dilation. Follow-up MRI abdomen without and with IV contrast
recommended in 1 year. This recommendation follows ACR consensus
guidelines: Management of Incidental Pancreatic Cysts: A White Paper
of the ACR Incidental Findings Committee. [HOSPITAL]
8868;[DATE].
2. Small to moderate hiatal hernia.
3. Small Bosniak category 2 hemorrhagic/proteinaceous renal cysts
bilaterally.

## 2020-07-19 DIAGNOSIS — E890 Postprocedural hypothyroidism: Secondary | ICD-10-CM | POA: Diagnosis not present

## 2020-07-19 DIAGNOSIS — E782 Mixed hyperlipidemia: Secondary | ICD-10-CM | POA: Diagnosis not present

## 2020-07-19 DIAGNOSIS — E559 Vitamin D deficiency, unspecified: Secondary | ICD-10-CM | POA: Diagnosis not present

## 2020-07-19 DIAGNOSIS — Z79899 Other long term (current) drug therapy: Secondary | ICD-10-CM | POA: Diagnosis not present

## 2020-07-19 DIAGNOSIS — R2689 Other abnormalities of gait and mobility: Secondary | ICD-10-CM | POA: Diagnosis not present

## 2020-07-19 DIAGNOSIS — F320 Major depressive disorder, single episode, mild: Secondary | ICD-10-CM | POA: Diagnosis not present

## 2020-07-19 DIAGNOSIS — I10 Essential (primary) hypertension: Secondary | ICD-10-CM | POA: Diagnosis not present

## 2020-07-30 DIAGNOSIS — M2042 Other hammer toe(s) (acquired), left foot: Secondary | ICD-10-CM | POA: Diagnosis not present

## 2020-07-30 DIAGNOSIS — M79671 Pain in right foot: Secondary | ICD-10-CM | POA: Diagnosis not present

## 2020-07-30 DIAGNOSIS — S92524S Nondisplaced fracture of medial phalanx of right lesser toe(s), sequela: Secondary | ICD-10-CM | POA: Diagnosis not present

## 2020-07-30 DIAGNOSIS — M2041 Other hammer toe(s) (acquired), right foot: Secondary | ICD-10-CM | POA: Diagnosis not present

## 2020-08-20 ENCOUNTER — Other Ambulatory Visit: Payer: Self-pay

## 2020-08-20 ENCOUNTER — Ambulatory Visit: Payer: PPO | Attending: Internal Medicine

## 2020-08-20 DIAGNOSIS — R2681 Unsteadiness on feet: Secondary | ICD-10-CM | POA: Insufficient documentation

## 2020-08-20 DIAGNOSIS — R2689 Other abnormalities of gait and mobility: Secondary | ICD-10-CM | POA: Diagnosis not present

## 2020-08-20 DIAGNOSIS — R269 Unspecified abnormalities of gait and mobility: Secondary | ICD-10-CM | POA: Diagnosis not present

## 2020-08-20 DIAGNOSIS — R262 Difficulty in walking, not elsewhere classified: Secondary | ICD-10-CM | POA: Insufficient documentation

## 2020-08-20 DIAGNOSIS — M6281 Muscle weakness (generalized): Secondary | ICD-10-CM | POA: Diagnosis not present

## 2020-08-20 DIAGNOSIS — R278 Other lack of coordination: Secondary | ICD-10-CM | POA: Diagnosis not present

## 2020-08-20 NOTE — Therapy (Signed)
Barbara Mclaughlin MAIN [facility] SERVICES [address] [place], [place], [address] Phone: [phone]   Fax:  [phone]  Physical Therapy Evaluation  Patient Details  Name: Barbara Mclaughlin MRN: [id number] Date of Birth: 1951/04/27 Referring Provider (PT): Barbara Mclaughlin   Encounter Date: 08/20/2020   PT End of Session - 08/20/20 0914    Visit Number 1    Number of Visits 25    Date for PT Re-Evaluation 11/12/20    Authorization Type Eval: 4/15; 1/10 PN    Authorization - Visit Number 1    Authorization - Number of Visits 10    PT Start Time 0800    PT Stop Time 0902    PT Time Calculation (min) 62 min    Equipment Utilized During Treatment Gait belt    Activity Tolerance Patient tolerated treatment well;Other (comment)   Increase in LBP at end of session   Behavior During Therapy [facility] for tasks assessed/performed           Past Medical History:  Diagnosis Date  . Acoustic neuroma (HCC)    right ear  . Anemia   . Arthritis   . B12 deficiency   . Basal cell carcinoma, eyelid   . Chronic fatigue syndrome   . Depression   . Dysrhythmia 0ne episode of tachycardia on 10/03/14  . GERD (gastroesophageal reflux disease)   . Headache   . Heart murmur bruit  . Hypertension   . Hyperthyroidism 01/05/2015  . Osteoporosis   . Palpitations   . Postablative hypothyroidism 09/03/2015  . SVT (supraventricular tachycardia) ([place])   . Thyroid nodule   . Vitamin D deficiency     Past Surgical History:  Procedure Laterality Date  . BREAST REDUCTION SURGERY  1990  . EYE SURGERY Bilateral cataract removal  . GASTRIC BYPASS    . LAPAROSCOPIC HYSTERECTOMY  07/2009  . REDUCTION MAMMAPLASTY Bilateral 20+ yrs ago  . TOTAL KNEE ARTHROPLASTY  2013   Dr. Marry Guan, left  . TOTAL KNEE ARTHROPLASTY Right 06/2013    There were no vitals filed for this visit.    Subjective Assessment - 08/20/20 0804    Subjective Pt presents to PT for balance impairments.  Pt reports relying on furniture or walls for slight balance when walking inside her home. Most nervous when walking in her community relying on husband for support or walking stick. Pt relies on shopping cart when shopping for balance and reports she has most difficulty with stepping up onto curbs without external support. 1 fall in last 6 months walking in grass on uneven surface. Pt states history of R knee pain and R sided LBP for last several years and walks "with a limp". Pt's goal is to improve her balance and be able to asc/desc a curb safely.    Pertinent History Pt is a 70 y.o. female referred to PT for balance impairment. PMH includes: HTN, HLD, dpression, High risk medication use, hypothyroidism, obesity, OA, osteoporosis. Has history of hammer toes on second and third digit on L foot and stable, non-displaced fracture of second metatarsal on R foot that is being treated conservatively. Pt reports pain in her R knee 3/10 from twisting her right knee getting out of her car. Pt reports neuropathy diagnosis where she was referred to neurologist. Pt reports in her community, she feels like she has to hold onto something or someone and feels unsteady. Most difficulty with stepping up onto curbs. Pt reports having a fall 2 months  ago in grass due to the uneven surface. This is the only fall that has occured in the last 6 months. Pt believes she always has to look down at her feet due to uneven surfaces for her safety. Pt reports utilizing a walking stick or holding onto her husband when walking up and down the road on average 1x/week and with community tasks. When in community with shopping tasks, she utilizes a shopping cart for her balance when husband is not with her. Pt's goal is to improve her walking and be able to asc/desc curbs safely.    Limitations Standing;Walking;Mclaughlin hold activities    How long can you sit comfortably? Unlimited    How long can you stand comfortably? 30 min    How long can  you walk comfortably? household is unlimited with furniture walking. With walking stick, 20 min.    Patient Stated Goals Be able to asc/desc curb and improve walking.    Currently in Pain? Yes    Pain Score 3     Pain Location Back    Pain Orientation Lower;Right    Pain Descriptors / Indicators Aching    Pain Type Chronic pain    Pain Onset More than a month ago              Barbara Mclaughlin PT Assessment - 08/20/20 0814      Assessment   Medical Diagnosis Balance impairment    Referring Provider (PT) Barbara Mclaughlin    Hand Dominance Right    Next MD Visit April this year    Prior Therapy Yes      Precautions   Precautions Fall      Balance Screen   Has the patient fallen in the past 6 months Yes    How many times? 1    Has the patient had a decrease in activity level because of a fear of falling?  Yes    Is the patient reluctant to leave their home because of a fear of falling?  Yes      [place] Private residence    Living Arrangements Spouse/significant other    Type of [place] to enter    Entrance Stairs-Number of Steps 8   From garage   Entrance Stairs-Rails Can reach both    [place] Two level    Alternate Level Stairs-Number of Steps [place] --   3 point cane, walking stick     Prior Function   Level of Independence Independent    Vocation Retired      Associate Professor   Overall Cognitive Status Within Functional Limits for tasks assessed      Ambulation/Gait   Ambulation/Gait Yes    Ambulation/Gait Assistance 7: Independent      6 minute walk test results    Aerobic Endurance Distance Walked 825    Endurance additional comments R antalgic gait worsens as time goes on. Variable step lengths.      Balance   Balance Assessed Yes      Functional Gait  Assessment   Gait assessed  Yes    Gait Level Surface Walks 20 ft in less than 7 sec but greater than 5.5 sec, uses assistive device, slower speed,  mild gait deviations, or deviates 6-10 in outside of the 12 in walkway width.    Change in Gait Speed Makes only minor adjustments to walking speed, or accomplishes a change in  speed with significant gait deviations, deviates 10-15 in outside the 12 in walkway width, or changes speed but loses balance but is able to recover and continue walking.    Gait with Horizontal Head Turns Performs head turns with moderate changes in gait velocity, slows down, deviates 10-15 in outside 12 in walkway width but recovers, can continue to walk.    Gait with Vertical Head Turns Performs task with slight change in gait velocity (eg, minor disruption to smooth gait path), deviates 6 - 10 in outside 12 in walkway width or uses assistive device    Gait and Pivot Turn Pivot turns safely in greater than 3 sec and stops with no loss of balance, or pivot turns safely within 3 sec and stops with mild imbalance, requires small steps to catch balance.    Step Over Obstacle Is able to step over one shoe box (4.5 in total height) but must slow down and adjust steps to clear box safely. May require verbal cueing.    Gait with Narrow Base of Support Ambulates less than 4 steps heel to toe or cannot perform without assistance.    Gait with Eyes Closed Walks 20 ft, slow speed, abnormal gait pattern, evidence for imbalance, deviates 10-15 in outside 12 in walkway width. Requires more than 9 sec to ambulate 20 ft.    Ambulating Backwards Walks 20 ft, slow speed, abnormal gait pattern, evidence for imbalance, deviates 10-15 in outside 12 in walkway width.    Steps Two feet to a stair, must use rail.    Total Score 12          Vitals:   BP: 117/64 mm Hg  HR: 91 BPM  SPO2: 96 %   PAIN: 5/10 on R knee  POSTURE: Slightly kyphotic in standing with forward head. Increased weightbearing on LLE > RLE in standing.   AROM BLE: Grossly WFL for tasks assessed. Limited B hip extension noted during gait.    STRENGTH:  Graded on a 0-5  scale; NT = not testeed Muscle Group Left Right                          Hip Flex 4-/5 4-/5  Hip Abd 4-/5 4-/5  Hip Add 4-/5 4-/5  Hip Ext NT NT  Hip IR/ER NT NT  Knee Flex 5/5 5/5  Knee Ext 5/5 5/5  Ankle DF 5/5 5/5  Ankle PF 5/5 5/5   SENSATION:  Slight decrease to LT on Lateral plantar surface on R foot compared to L foot due to neuropathy.  NEUROLOGICAL SCREEN: (2+ unless otherwise noted.) N=normal  Ab=abnormal   Level Dermatome R L                                     L2 Medial thigh/groin N N  L3 Lower thigh/med.knee N N  L4 Medial leg/lat thigh N N  L5 Lat. leg & dorsal foot N N  S1 post/lat foot/thigh/leg Ab N  S2 Post./med. thigh & leg N N    SOMATOSENSORY:  Any N & T in extremities or weakness: reports : None    Proprioception:  B feet intact   B great toes intact     FUNCTIONAL MOBILITY:  Sit to stand: requires B hands on seat to assist in standing. Extra time to perform.   Asc/desc stairs: Requires B handrail use but able  to perform step through pattern with asc/desc.   BALANCE: Static Sitting Balance  Normal Able to maintain balance against maximal resistance   Good Able to maintain balance against moderate resistance X  Good-/Fair+ Accepts minimal resistance   Fair Able to sit unsupported without balance loss and without UE support   Poor+ Able to maintain with Minimal assistance from individual or chair   Poor Unable to maintain balance-requires mod/max support from individual or chair    Static Standing Balance  Normal Able to maintain standing balance against maximal resistance   Good Able to maintain standing balance against moderate resistance   Good-/Fair+ Able to maintain standing balance against minimal resistance   Fair Able to stand unsupported without UE support and without LOB for 1-2 min X  Fair- Requires Min A and UE support to maintain standing without loss of balance   Poor+ Requires mod A and UE support to maintain  standing without loss of balance   Poor Requires max A and UE support to maintain standing balance without loss    Dynamic Sitting Balance  Normal Able to sit unsupported and weight shift across midline maximally   Good Able to sit unsupported and weight shift across midline moderately X  Good-/Fair+ Able to sit unsupported and weight shift across midline minimally   Fair Minimal weight shifting ipsilateral/front, difficulty crossing midline   Fair- Reach to ipsilateral side and unable to weight shift   Poor + Able to sit unsupported with min A and reach to ipsilateral side, unable to weight shift   Poor Able to sit unsupported with mod A and reach ipsilateral/front-can't cross midline    Standing Dynamic Balance  Normal Stand independently unsupported, able to weight shift and cross midline maximally   Good Stand independently unsupported, able to weight shift and cross midline moderately   Good-/Fair+ Stand independently unsupported, able to weight shift across midline minimally X  Fair Stand independently unsupported, weight shift, and reach ipsilaterally, loss of balance when crossing midline   Poor+ Able to stand with Min A and reach ipsilaterally, unable to weight shift   Poor Able to stand with Mod A and minimally reach ipsilaterally, unable to cross midline.      GAIT: Pt ambulates with R antalgic gait that worsens as pt fatigues. B trendelenburg noted with decreased stance time on RLE and limited hip extension bilaterally during terminal stance of gait. Noted intermittent variable step lengths that pt is able to correct independently.  Assessed gait with cane at end of session. Decrease in R antalgic gait with more normalized mechanics and improved stance time on RLE with more consistent step lengths with no noted variable steps.   OUTCOME MEASURES: TEST Outcome Interpretation  5 times sit<>stand 17. 76 sec with hands on chair >60 yo, >15 sec indicates increased risk for falls   10 meter walk test 0.739 M/s self selected 0.813 m/s fast <1.0 m/s indicates increased risk for falls; limited community ambulator  6 minute walk test 825 Feet 1000 feet is community ambulator  FGA 12/30 > 19 at decreased risk of falls  FOTO 45/60         Pt educated on need of cane use for decreasing risk of falls. Pt educated on how to adjust cane height and how to ambulate safely on flat, level surfaces along with how to asc/desc stairs with cane and single railing. Due to improved gait mechanics and independent use of cane with PT supervision in clinic, PT educated pt  to begin practicing with cane in home on flat, level surfaces with supervision.     Objective measurements completed on examination: See above findings.      PT Education - 08/20/20 0914    Education Details Asc/desc stairs with cane to enter/exit home safely. POC going forward. How to amb on flat level surfaces with cane. How to adjust cane to appropriate height.    Person(s) Educated Patient    Methods Explanation;Demonstration    Comprehension Verbalized understanding;Returned demonstration;Need further instruction            PT Short Term Goals - 08/20/20 0926      PT SHORT TERM GOAL #1   Title Pt will be indep with strength and balance HEP to improve functional mobility.    Baseline 4/15: Initiate next session.    Time 6    Period Weeks    Status New    Target Date 10/01/20             PT Long Term Goals - 08/20/20 0929      PT LONG TERM GOAL #1   Title Pt will improve 5xSTS to < 12 seconds with no UE support for clinically significant improvement in LE strength.    Baseline 4/15: 17.76 sec with hands on seat for assist.    Time 12    Period Weeks    Status New    Target Date 11/12/20      PT LONG TERM GOAL #2   Title Pt will improve 6MWT to > 1000' with no AD to demonstrate pt is at a decreased risk of falls for community ambulation distances.    Baseline 4/15: 825' with no AD. Variable  step lengths intermittently and R antalgic gait.    Time 12    Period Weeks    Status New    Target Date 11/12/20      PT LONG TERM GOAL #3   Title Pt will improve FGA to 19 or greater to demonstrate pt is at a decreased risk of falls with community and household ADL's.    Baseline 4/15: 12/30    Time 12    Period Weeks    Status New    Target Date 11/12/20      PT LONG TERM GOAL #4   Title Pt will improve FOTO to target score of 60 to demonstrate improvements in functional mobility.    Baseline 4/15: 45/60    Time 12    Period Weeks    Status New    Target Date 11/12/20      PT LONG TERM GOAL #5   Title Pt will improve gait speed to at least 0.8 m/s with no AD and normalized gait to be at decreased risk of falls for houshold and short community distances.    Baseline 4/15: 0.739 m/s self selected speed. No AD with antalgic gait on RLE.    Time 12    Period Weeks    Status New    Target Date 11/12/20                  Plan - 08/20/20 0916    Clinical Impression Statement Pt is a pleasant 70 y.o. woman referred to PT for balance impairments with 1 fall in grass in last 6 months. Has baseline R antalgic gait due to R knee pain and R sided LBP. Slight decreased sensation to LT on lateral aspect of plantar surface of R foot with normal sensation on  L foot. Normal proprioception of B feet and great toes. Normal sensation to LT in lumbar dermatomes. Pt has proximal hip weakness scored at 4-/5 in hip flexors, abductors, and adductors with 5/5 strength from knees down to feet. Pt ambulates with R antalgic gait that worsens as gait persists due to fatigue and displays B trendelenburg with intermittent variable step lengths and decreased stance time on RLE. Performed 5xSTS in 17.76 sec requiring BUE support, 6 minute walk test in 825', and FGA scoring a 12/30 with a FOTO score of 45/60. These objective measures indicate LE weakness and pt is at increased risk of falls with household and  community walking tasks. Pt educated on benefits of cane for improved stability and educated on how to adjust height, asc/desc stairs with single handrail and cane. Also educated on how to walk on flat, level surfaces. No imbalance present and good technique and sequencing in PT clinic with supervision. Reduced antalgic gait and pt reports of improved stability. Pt educated to practice cane use at home with supervision on levels surfaces and PT will reinforce use of cane with community tasks with verbalized understanding. Pt can benefit from skilled PT services to improve BLE strength, walking mechanics, and balance to reduce pt's risk of falls.    Personal Factors and Comorbidities Age;Comorbidity 3+;Fitness;Time since onset of injury/illness/exacerbation    Comorbidities HTN, HLD, depression, High risk medication use, hypothyroidism, obesity, OA, osteoporosis. Has history of hammer toes on second and third digit on L foot and stable, non-displaced fracture of second metatarsal on R foot that is being treated conservatively.    Examination-Activity Limitations Lift;Squat;Stairs;Locomotion Level;Transfers    Examination-Participation Restrictions Cleaning;Shop;Community Activity;Yard Work    [employer]chant [employer] officer Evolving/Moderate complexity    Clinical Decision Making Moderate    Rehab Potential Good    PT Frequency 2x / week    PT Duration 12 weeks    PT Treatment/Interventions ADLs/Self Care Home Management;Cryotherapy;Electrical Stimulation;Moist Heat;DME Instruction;Gait training;Stair training;Functional mobility training;Therapeutic activities;Therapeutic exercise;Balance training;Neuromuscular re-education;Patient/family education;Energy conservation;Dry needling;Passive range of motion;Manual techniques    PT Next Visit Plan Reassess cane on level surfaces/stairs. Progress to community type tasks with cane. Develop LE strength HEP.    PT Home Exercise Plan Next session. Begin use of  cane on flat, level surface in home    Consulted and Agree with Plan of Care Patient           Patient will benefit from skilled therapeutic intervention in order to improve the following deficits and impairments:  Abnormal gait,Impaired sensation,Pain,Improper body mechanics,Decreased mobility,Decreased activity tolerance,Decreased endurance,Decreased strength,Decreased balance,Difficulty walking  Visit Diagnosis: Abnormality of gait and mobility  Difficulty in walking, not elsewhere classified  Muscle weakness (generalized)     Problem List Patient Active Problem List   Diagnosis Date Noted  . S/P gastric bypass 01/29/2020  . Elevated lipoprotein(a) 01/29/2020  . B12 deficiency 01/29/2020  . Migraines 09/10/2017  . AV nodal re-entry tachycardia ([place]) 11/03/2016  . Vitamin D deficiency, unspecified 06/12/2016  . Schwannoma 10/05/2015  . Postablative hypothyroidism 09/03/2015  . Memory change 07/06/2015  . Insomnia 06/04/2015  . Chronic fatigue 06/04/2015  . Hyperthyroidism 01/05/2015  . Atrophic vaginitis 01/05/2015  . SVT (supraventricular tachycardia) ([place]) 10/08/2014  . Thyroid nodule 08/11/2014  . Basal cell carcinoma of left eyelid 08/11/2014  . Anemia, iron deficiency 04/09/2014  . Acute low back pain 09/16/2013  . Dysphagia, pharyngoesophageal phase 04/29/2013  . Routine general medical examination at a health care facility 01/13/2013  . Anemia 01/11/2012  .  Chronic pain 11/20/2011  . Screening for breast cancer 11/20/2011  . Arthritis 04/17/2011  . Hypertension 04/17/2011  . Depression, recurrent ([place]) 04/17/2011  . GERD (gastroesophageal reflux disease) 04/17/2011  . Menopausal and perimenopausal disorder 04/17/2011  . Overweight (BMI 25.0-29.9) 04/17/2011  . Pre-operative cardiovascular examination 09/19/2010  . Abnormal EKG 09/19/2010    Salem Caster. Fairly IV, PT, DPT Physical Therapist- [facility]  08/20/2020,  10:07 AM  [place] MAIN [facility] SERVICES [address] [place], [place], [address] Phone: [phone]   Fax:  [phone]  Name: ANAYSIA GERMER MRN: [id number] Date of Birth: Aug 04, 1950

## 2020-08-23 ENCOUNTER — Ambulatory Visit: Payer: PPO

## 2020-08-24 ENCOUNTER — Ambulatory Visit: Payer: PPO

## 2020-08-24 ENCOUNTER — Other Ambulatory Visit: Payer: Self-pay

## 2020-08-24 DIAGNOSIS — R269 Unspecified abnormalities of gait and mobility: Secondary | ICD-10-CM

## 2020-08-24 DIAGNOSIS — R262 Difficulty in walking, not elsewhere classified: Secondary | ICD-10-CM

## 2020-08-24 DIAGNOSIS — M6281 Muscle weakness (generalized): Secondary | ICD-10-CM

## 2020-08-24 DIAGNOSIS — R2689 Other abnormalities of gait and mobility: Secondary | ICD-10-CM

## 2020-08-24 NOTE — Therapy (Signed)
[place] MAIN [facility] SERVICES [address] [place], [place], [address] Phone: [phone]   Fax:  [phone]  Physical Therapy Treatment  Patient Details  Name: Barbara Mclaughlin MRN: [id number] Date of Birth: [birth year] Referring Provider (PT): Fulton Reek   Encounter Date: 08/24/2020   PT End of Session - 08/24/20 0858    Visit Number 2    Number of Visits 25    Date for PT Re-Evaluation 11/12/20    Authorization Type Eval: 4/15; 1/10 PN    Authorization - Visit Number 1    Authorization - Number of Visits 10    PT Start Time 0845    PT Stop Time 0928    PT Time Calculation (min) 43 min    Equipment Utilized During Treatment Gait belt    Activity Tolerance Patient tolerated treatment well;Other (comment)   Increase in LBP at end of session   Behavior During Therapy [facility] for tasks assessed/performed           Past Medical History:  Diagnosis Date  . Acoustic neuroma (HCC)    right ear  . Anemia   . Arthritis   . B12 deficiency   . Basal cell carcinoma, eyelid   . Chronic fatigue syndrome   . Depression   . Dysrhythmia 0ne episode of tachycardia on 10/03/14  . GERD (gastroesophageal reflux disease)   . Headache   . Heart murmur bruit  . Hypertension   . Hyperthyroidism 01/05/2015  . Osteoporosis   . Palpitations   . Postablative hypothyroidism 09/03/2015  . SVT (supraventricular tachycardia) ([place])   . Thyroid nodule   . Vitamin D deficiency     Past Surgical History:  Procedure Laterality Date  . BREAST REDUCTION SURGERY  1990  . EYE SURGERY Bilateral cataract removal  . GASTRIC BYPASS    . LAPAROSCOPIC HYSTERECTOMY  07/2009  . REDUCTION MAMMAPLASTY Bilateral 20+ yrs ago  . TOTAL KNEE ARTHROPLASTY  2013   Dr. Marry Guan, left  . TOTAL KNEE ARTHROPLASTY Right 06/2013    There were no vitals filed for this visit.   Subjective Assessment - 08/24/20 0857    Subjective Patient reports she is trying to use her cane  and brought it in to be adjusted.    Pertinent History Pt is a 70 y.o. female referred to PT for balance impairment. PMH includes: HTN, HLD, dpression, High risk medication use, hypothyroidism, obesity, OA, osteoporosis. Has history of hammer toes on second and third digit on L foot and stable, non-displaced fracture of second metatarsal on R foot that is being treated conservatively. Pt reports pain in her R knee 3/10 from twisting her right knee getting out of her car. Pt reports neuropathy diagnosis where she was referred to neurologist. Pt reports in her community, she feels like she has to hold onto something or someone and feels unsteady. Most difficulty with stepping up onto curbs. Pt reports having a fall 2 months ago in grass due to the uneven surface. This is the only fall that has occured in the last 6 months. Pt believes she always has to look down at her feet due to uneven surfaces for her safety. Pt reports utilizing a walking stick or holding onto her husband when walking up and down the road on average 1x/week and with community tasks. When in community with shopping tasks, she utilizes a shopping cart for her balance when husband is not with her. Pt's goal is to improve her walking and  be able to asc/desc curbs safely.    Limitations Standing;Walking;House hold activities    How long can you sit comfortably? Unlimited    How long can you stand comfortably? 30 min    How long can you walk comfortably? household is unlimited with furniture walking. With walking stick, 20 min.    Patient Stated Goals Be able to asc/desc curb and improve walking.    Currently in Pain? Yes    Pain Score 5     Pain Location Back    Pain Orientation Posterior    Pain Descriptors / Indicators Aching    Pain Type Chronic pain    Pain Onset More than a month ago    Pain Frequency Constant            Adjusted cane for appropriate height and patient instructed in gait with use of cane on left side x 200  feet. Patient denied any issues with using cane on left side.   Instructed patient in seated LE strengthening:   Hip march x 10 reps BLE Hip abd x 10 reps BLE Sit to stand with min B UE support x 6 reps VC and PT visual demo for correct techniques. Patient able to follow and denied low back with exercises.  Neuromuscular re-ed:   Static stand with feet together with EO, EC x 20 sec x 3 sets each - VC for correct technique- Patient with mild increased sway with EC.   Static stand with feet Staggered- EO, EC, Head turns with EO and then Head turns with EC- 4 trials each - mild increased sway with EC and 1 UE support at times with EC and head turning.      Access Code: [email or web address] URL: [email or web address] Date: 08/24/2020 Prepared by: Sande Brothers, PT  Exercises Seated Hip Flexion March with Ankle Weights - 1 x daily - 5 x weekly - 3 sets - 10 reps - 2 hold Seated Hip Abduction - 1 x daily - 5 x weekly - 3 sets - 10 reps Romberg Stance with Eyes Closed - 1 x daily - 3-4 x weekly - 3 sets - 20 hold Standing Romberg to 3/4 Tandem Stance - 1 x daily - 3-4 x weekly - 3 sets - 20 hold Standing with Head Rotation - 1 x daily - 3-4 x weekly - 3 sets - 20 hold Proper Sit to Stand Technique - 1 x daily - 5 x weekly - 3 sets - 10 reps        Clinical Impression: Patient challenged today with balance activities and seated therex yet able to follow verbal cues and visual demo and improved with practice and instruction today. Patient fatigued with therex yet presented with good participation. Issued handout for initial balance exercises and walking. Pt can benefit from skilled PT services to improve BLE strength, walking mechanics, and balance to reduce pt's risk of falls.                   PT Education - 08/24/20 1725    Education Details Access Code: [email or web address]  URL: [email or web address]    Person(s) Educated Patient    Methods  Explanation;Demonstration;Tactile cues;Verbal cues;Handout    Comprehension Verbalized understanding;Verbal cues required;Need further instruction;Returned demonstration;Tactile cues required            PT Short Term Goals - 08/20/20 0926      PT SHORT TERM GOAL #1   Title Pt will be indep with strength and balance HEP to improve  functional mobility.    Baseline 4/15: Initiate next session.    Time 6    Period Weeks    Status New    Target Date 10/01/20             PT Long Term Goals - 08/20/20 0929      PT LONG TERM GOAL #1   Title Pt will improve 5xSTS to < 12 seconds with no UE support for clinically significant improvement in LE strength.    Baseline 4/15: 17.76 sec with hands on seat for assist.    Time 12    Period Weeks    Status New    Target Date 11/12/20      PT LONG TERM GOAL #2   Title Pt will improve 6MWT to > 1000' with no AD to demonstrate pt is at a decreased risk of falls for community ambulation distances.    Baseline 4/15: 825' with no AD. Variable step lengths intermittently and R antalgic gait.    Time 12    Period Weeks    Status New    Target Date 11/12/20      PT LONG TERM GOAL #3   Title Pt will improve FGA to 19 or greater to demonstrate pt is at a decreased risk of falls with community and household ADL's.    Baseline 4/15: 12/30    Time 12    Period Weeks    Status New    Target Date 11/12/20      PT LONG TERM GOAL #4   Title Pt will improve FOTO to target score of 60 to demonstrate improvements in functional mobility.    Baseline 4/15: 45/60    Time 12    Period Weeks    Status New    Target Date 11/12/20      PT LONG TERM GOAL #5   Title Pt will improve gait speed to at least 0.8 m/s with no AD and normalized gait to be at decreased risk of falls for houshold and short community distances.    Baseline 4/15: 0.739 m/s self selected speed. No AD with antalgic gait on RLE.    Time 12    Period Weeks    Status New    Target Date  11/12/20                 Plan - 08/24/20 1728    Clinical Impression Statement Patient challenged today with balance activities and seated therex yet able to follow verbal cues and visual demo and improved with practice and instruction today. Patient fatigued with therex yet presented with good participation. Issued handout for initial balance exercises and walking. Pt can benefit from skilled PT services to improve BLE strength, walking mechanics, and balance to reduce pt's risk of falls.    Personal Factors and Comorbidities Age;Comorbidity 3+;Fitness;Time since onset of injury/illness/exacerbation    Comorbidities HTN, HLD, depression, High risk medication use, hypothyroidism, obesity, OA, osteoporosis. Has history of hammer toes on second and third digit on L foot and stable, non-displaced fracture of second metatarsal on R foot that is being treated conservatively.    Examination-Activity Limitations Lift;Squat;Stairs;Locomotion Level;Transfers    Examination-Participation Restrictions Cleaning;Shop;Community Activity;Yard Work    [employer]chant [employer] officer Evolving/Moderate complexity    Rehab Potential Good    PT Frequency 2x / week    PT Duration 12 weeks    PT Treatment/Interventions ADLs/Self Care Home Management;Cryotherapy;Electrical Stimulation;Moist Heat;DME Instruction;Gait training;Stair training;Functional mobility training;Therapeutic activities;Therapeutic exercise;Balance training;Neuromuscular re-education;Patient/family education;Energy  conservation;Dry needling;Passive range of motion;Manual techniques    PT Next Visit Plan Reassess cane on level surfaces/stairs. Progress to community type tasks with cane. Develop LE strength HEP.    PT Home Exercise Plan Next session. Begin use of cane on flat, level surface in home    Consulted and Agree with Plan of Care Patient           Patient will benefit from skilled therapeutic intervention in order to improve the  following deficits and impairments:  Abnormal gait,Impaired sensation,Pain,Improper body mechanics,Decreased mobility,Decreased activity tolerance,Decreased endurance,Decreased strength,Decreased balance,Difficulty walking  Visit Diagnosis: Abnormality of gait and mobility  Difficulty in walking, not elsewhere classified  Muscle weakness (generalized)  Other abnormalities of gait and mobility     Problem List Patient Active Problem List   Diagnosis Date Noted  . S/P gastric bypass 01/29/2020  . Elevated lipoprotein(a) 01/29/2020  . B12 deficiency 01/29/2020  . Migraines 09/10/2017  . AV nodal re-entry tachycardia ([place]) 11/03/2016  . Vitamin D deficiency, unspecified 06/12/2016  . Schwannoma 10/05/2015  . Postablative hypothyroidism 09/03/2015  . Memory change 07/06/2015  . Insomnia 06/04/2015  . Chronic fatigue 06/04/2015  . Hyperthyroidism 01/05/2015  . Atrophic vaginitis 01/05/2015  . SVT (supraventricular tachycardia) ([place]) 10/08/2014  . Thyroid nodule 08/11/2014  . Basal cell carcinoma of left eyelid 08/11/2014  . Anemia, iron deficiency 04/09/2014  . Acute low back pain 09/16/2013  . Dysphagia, pharyngoesophageal phase 04/29/2013  . Routine general medical examination at a health care facility 01/13/2013  . Anemia 01/11/2012  . Chronic pain 11/20/2011  . Screening for breast cancer 11/20/2011  . Arthritis 04/17/2011  . Hypertension 04/17/2011  . Depression, recurrent ([place]) 04/17/2011  . GERD (gastroesophageal reflux disease) 04/17/2011  . Menopausal and perimenopausal disorder 04/17/2011  . Overweight (BMI 25.0-29.9) 04/17/2011  . Pre-operative cardiovascular examination 09/19/2010  . Abnormal EKG 09/19/2010    Lewis Moccasin, PT 08/24/2020, 5:31 PM  [place] MAIN [facility] SERVICES [address] [place], [place], [address] Phone: [phone]   Fax:  [phone]  Name: Barbara Mclaughlin MRN: [id number] Date  of Birth: Sep 27, 1950

## 2020-08-26 ENCOUNTER — Ambulatory Visit: Payer: PPO

## 2020-08-26 DIAGNOSIS — R1319 Other dysphagia: Secondary | ICD-10-CM | POA: Diagnosis not present

## 2020-08-26 DIAGNOSIS — R1013 Epigastric pain: Secondary | ICD-10-CM | POA: Diagnosis not present

## 2020-08-26 DIAGNOSIS — R1084 Generalized abdominal pain: Secondary | ICD-10-CM | POA: Diagnosis not present

## 2020-08-31 ENCOUNTER — Ambulatory Visit: Payer: PPO

## 2020-08-31 ENCOUNTER — Other Ambulatory Visit: Payer: Self-pay

## 2020-08-31 DIAGNOSIS — R262 Difficulty in walking, not elsewhere classified: Secondary | ICD-10-CM

## 2020-08-31 DIAGNOSIS — E559 Vitamin D deficiency, unspecified: Secondary | ICD-10-CM | POA: Diagnosis not present

## 2020-08-31 DIAGNOSIS — R269 Unspecified abnormalities of gait and mobility: Secondary | ICD-10-CM | POA: Diagnosis not present

## 2020-08-31 DIAGNOSIS — E782 Mixed hyperlipidemia: Secondary | ICD-10-CM | POA: Diagnosis not present

## 2020-08-31 DIAGNOSIS — I10 Essential (primary) hypertension: Secondary | ICD-10-CM | POA: Diagnosis not present

## 2020-08-31 DIAGNOSIS — E890 Postprocedural hypothyroidism: Secondary | ICD-10-CM | POA: Diagnosis not present

## 2020-08-31 DIAGNOSIS — F321 Major depressive disorder, single episode, moderate: Secondary | ICD-10-CM | POA: Diagnosis not present

## 2020-08-31 DIAGNOSIS — M6281 Muscle weakness (generalized): Secondary | ICD-10-CM

## 2020-08-31 DIAGNOSIS — Z79899 Other long term (current) drug therapy: Secondary | ICD-10-CM | POA: Diagnosis not present

## 2020-08-31 DIAGNOSIS — R278 Other lack of coordination: Secondary | ICD-10-CM

## 2020-08-31 DIAGNOSIS — Z0000 Encounter for general adult medical examination without abnormal findings: Secondary | ICD-10-CM | POA: Diagnosis not present

## 2020-08-31 NOTE — Therapy (Signed)
[place] MAIN [facility] SERVICES [address] [place], [place], [address] Phone: [phone]   Fax:  [phone]  Physical Therapy Treatment  Patient Details  Name: Barbara Mclaughlin MRN: [id number] Date of Birth: 1950-07-01 Referring Provider (PT): Fulton Reek   Encounter Date: 08/31/2020   PT End of Session - 08/31/20 1346    Visit Number 3    Number of Visits 25    Date for PT Re-Evaluation 11/12/20    Authorization Type Eval: 4/15; 1/10 PN    Authorization - Visit Number 1    Authorization - Number of Visits 10    PT Start Time 5397    PT Stop Time 1430    PT Time Calculation (min) 45 min    Equipment Utilized During Treatment Gait belt    Activity Tolerance Patient tolerated treatment well;Other (comment)   Increase in LBP at end of session   Behavior During Therapy [facility] for tasks assessed/performed           Past Medical History:  Diagnosis Date  . Acoustic neuroma (HCC)    right ear  . Anemia   . Arthritis   . B12 deficiency   . Basal cell carcinoma, eyelid   . Chronic fatigue syndrome   . Depression   . Dysrhythmia 0ne episode of tachycardia on 10/03/14  . GERD (gastroesophageal reflux disease)   . Headache   . Heart murmur bruit  . Hypertension   . Hyperthyroidism 01/05/2015  . Osteoporosis   . Palpitations   . Postablative hypothyroidism 09/03/2015  . SVT (supraventricular tachycardia) ([place])   . Thyroid nodule   . Vitamin D deficiency     Past Surgical History:  Procedure Laterality Date  . BREAST REDUCTION SURGERY  1990  . EYE SURGERY Bilateral cataract removal  . GASTRIC BYPASS    . LAPAROSCOPIC HYSTERECTOMY  07/2009  . REDUCTION MAMMAPLASTY Bilateral 20+ yrs ago  . TOTAL KNEE ARTHROPLASTY  2013   Dr. Marry Guan, left  . TOTAL KNEE ARTHROPLASTY Right 06/2013    There were no vitals filed for this visit.   Subjective Assessment - 08/31/20 1345    Subjective Patient reports she is using her cane and  trying the exercises that have been prescribed.    Pertinent History Pt is a 70 y.o. female referred to PT for balance impairment. PMH includes: HTN, HLD, dpression, High risk medication use, hypothyroidism, obesity, OA, osteoporosis. Has history of hammer toes on second and third digit on L foot and stable, non-displaced fracture of second metatarsal on R foot that is being treated conservatively. Pt reports pain in her R knee 3/10 from twisting her right knee getting out of her car. Pt reports neuropathy diagnosis where she was referred to neurologist. Pt reports in her community, she feels like she has to hold onto something or someone and feels unsteady. Most difficulty with stepping up onto curbs. Pt reports having a fall 2 months ago in grass due to the uneven surface. This is the only fall that has occured in the last 6 months. Pt believes she always has to look down at her feet due to uneven surfaces for her safety. Pt reports utilizing a walking stick or holding onto her husband when walking up and down the road on average 1x/week and with community tasks. When in community with shopping tasks, she utilizes a shopping cart for her balance when husband is not with her. Pt's goal is to improve her walking and be  able to asc/desc curbs safely.    Limitations Standing;Walking;House hold activities    How long can you sit comfortably? Unlimited    How long can you stand comfortably? 30 min    How long can you walk comfortably? household is unlimited with furniture walking. With walking stick, 20 min.    Patient Stated Goals Be able to asc/desc curb and improve walking.    Currently in Pain? Yes    Pain Location Back    Pain Onset More than a month ago           Interventions:  Neuromuscular re-education:   Side step over orange hurdle x 12 reps each LE with min 1UE support on bar.  Forward/backward step over hurdle with 1 UE support on bar x 10 reps- Mild difficulty reported and observed with  backward step requiring CGA.  Tandem gait- 1UE support - along bar approx 6 feet x 4 trials. - mild difficulty Staggered stance in corner near bar and steps - hold 20 sec x 3 each feet - mild increased sway- EO then EC x 3 sets each. Gait in hallway with horizontal/vertical head movements using SPC: Forward walking with head turns up/down, side/side x80 feet each Patient required mod VCs to increase step length and improve gait speed Required cues to improve gaze stabilization with head turns to reduce veering side/side.  Standing on purple pad - Feet apart - EO then closed x 20 sec x 3 trials each- Patient with marked increased sway A/P   Standing on 1/2 white foam roll - attempting to static stand - Patient exhibited increased posterior sway with increased unsteadiness.  Education provided throughout session via VC/TC and demonstration to facilitate movement at target joints and correct muscle activation for all testing and exercises performed.      Clinical Impression: Patient challenged today with balance activities including increased A/P sway with unsteadiness yet she did improve overall with practice. Patient was able to progress to performing tandem gait  Without significant unsteadiness today.  Pt can benefit from skilled PT services to improve BLE strength, walking mechanics, and balance to reduce pt's risk of falls.                      PT Education - 09/01/20 0919    Education Details specific balance technique with activities    Person(s) Educated Patient    Methods Explanation;Demonstration;Tactile cues;Verbal cues    Comprehension Verbalized understanding;Verbal cues required;Returned demonstration;Tactile cues required;Need further instruction            PT Short Term Goals - 08/20/20 0926      PT SHORT TERM GOAL #1   Title Pt will be indep with strength and balance HEP to improve functional mobility.    Baseline 4/15: Initiate next session.    Time  6    Period Weeks    Status New    Target Date 10/01/20             PT Long Term Goals - 08/20/20 0929      PT LONG TERM GOAL #1   Title Pt will improve 5xSTS to < 12 seconds with no UE support for clinically significant improvement in LE strength.    Baseline 4/15: 17.76 sec with hands on seat for assist.    Time 12    Period Weeks    Status New    Target Date 11/12/20      PT LONG TERM GOAL #2  Title Pt will improve 6MWT to > 1000' with no AD to demonstrate pt is at a decreased risk of falls for community ambulation distances.    Baseline 4/15: 825' with no AD. Variable step lengths intermittently and R antalgic gait.    Time 12    Period Weeks    Status New    Target Date 11/12/20      PT LONG TERM GOAL #3   Title Pt will improve FGA to 19 or greater to demonstrate pt is at a decreased risk of falls with community and household ADL's.    Baseline 4/15: 12/30    Time 12    Period Weeks    Status New    Target Date 11/12/20      PT LONG TERM GOAL #4   Title Pt will improve FOTO to target score of 60 to demonstrate improvements in functional mobility.    Baseline 4/15: 45/60    Time 12    Period Weeks    Status New    Target Date 11/12/20      PT LONG TERM GOAL #5   Title Pt will improve gait speed to at least 0.8 m/s with no AD and normalized gait to be at decreased risk of falls for houshold and short community distances.    Baseline 4/15: 0.739 m/s self selected speed. No AD with antalgic gait on RLE.    Time 12    Period Weeks    Status New    Target Date 11/12/20                 Plan - 08/31/20 1346    Clinical Impression Statement Patient challenged today with balance activities including increased A/P sway with unsteadiness yet she did improve overall with practice. Patient was able to progress to performing tandem gait  Without significant unsteadiness today.  Pt can benefit from skilled PT services to improve BLE strength, walking mechanics, and  balance to reduce pt's risk of falls.    Personal Factors and Comorbidities Age;Comorbidity 3+;Fitness;Time since onset of injury/illness/exacerbation    Comorbidities HTN, HLD, depression, High risk medication use, hypothyroidism, obesity, OA, osteoporosis. Has history of hammer toes on second and third digit on L foot and stable, non-displaced fracture of second metatarsal on R foot that is being treated conservatively.    Examination-Activity Limitations Lift;Squat;Stairs;Locomotion Level;Transfers    Examination-Participation Restrictions Cleaning;Shop;Community Activity;Yard Work    [employer]chant [employer] officer Evolving/Moderate complexity    Rehab Potential Good    PT Frequency 2x / week    PT Duration 12 weeks    PT Treatment/Interventions ADLs/Self Care Home Management;Cryotherapy;Electrical Stimulation;Moist Heat;DME Instruction;Gait training;Stair training;Functional mobility training;Therapeutic activities;Therapeutic exercise;Balance training;Neuromuscular re-education;Patient/family education;Energy conservation;Dry needling;Passive range of motion;Manual techniques    PT Next Visit Plan Reassess cane on level surfaces/stairs. Progress to community type tasks with cane. Develop LE strength HEP.    PT Home Exercise Plan Next session. Begin use of cane on flat, level surface in home    Consulted and Agree with Plan of Care Patient           Patient will benefit from skilled therapeutic intervention in order to improve the following deficits and impairments:  Abnormal gait,Impaired sensation,Pain,Improper body mechanics,Decreased mobility,Decreased activity tolerance,Decreased endurance,Decreased strength,Decreased balance,Difficulty walking  Visit Diagnosis: Abnormality of gait and mobility  Difficulty in walking, not elsewhere classified  Muscle weakness (generalized)  Other lack of coordination     Problem List Patient Active Problem List   Diagnosis Date  Noted   . S/P gastric bypass 01/29/2020  . Elevated lipoprotein(a) 01/29/2020  . B12 deficiency 01/29/2020  . Migraines 09/10/2017  . AV nodal re-entry tachycardia ([place]) 11/03/2016  . Vitamin D deficiency, unspecified 06/12/2016  . Schwannoma 10/05/2015  . Postablative hypothyroidism 09/03/2015  . Memory change 07/06/2015  . Insomnia 06/04/2015  . Chronic fatigue 06/04/2015  . Hyperthyroidism 01/05/2015  . Atrophic vaginitis 01/05/2015  . SVT (supraventricular tachycardia) ([place]) 10/08/2014  . Thyroid nodule 08/11/2014  . Basal cell carcinoma of left eyelid 08/11/2014  . Anemia, iron deficiency 04/09/2014  . Acute low back pain 09/16/2013  . Dysphagia, pharyngoesophageal phase 04/29/2013  . Routine general medical examination at a health care facility 01/13/2013  . Anemia 01/11/2012  . Chronic pain 11/20/2011  . Screening for breast cancer 11/20/2011  . Arthritis 04/17/2011  . Hypertension 04/17/2011  . Depression, recurrent ([place]) 04/17/2011  . GERD (gastroesophageal reflux disease) 04/17/2011  . Menopausal and perimenopausal disorder 04/17/2011  . Overweight (BMI 25.0-29.9) 04/17/2011  . Pre-operative cardiovascular examination 09/19/2010  . Abnormal EKG 09/19/2010    Lewis Moccasin, PT 09/01/2020, 3:30 PM  [place] MAIN [facility] SERVICES [address] [place], [place], [address] Phone: [phone]   Fax:  [phone]  Name: Barbara Mclaughlin MRN: [id number] Date of Birth: 13-Jul-1950

## 2020-09-01 DIAGNOSIS — R569 Unspecified convulsions: Secondary | ICD-10-CM | POA: Diagnosis not present

## 2020-09-01 DIAGNOSIS — D3610 Benign neoplasm of peripheral nerves and autonomic nervous system, unspecified: Secondary | ICD-10-CM | POA: Diagnosis not present

## 2020-09-01 DIAGNOSIS — E538 Deficiency of other specified B group vitamins: Secondary | ICD-10-CM | POA: Diagnosis not present

## 2020-09-02 ENCOUNTER — Ambulatory Visit: Payer: PPO

## 2020-09-02 ENCOUNTER — Other Ambulatory Visit: Payer: Self-pay

## 2020-09-02 DIAGNOSIS — R269 Unspecified abnormalities of gait and mobility: Secondary | ICD-10-CM | POA: Diagnosis not present

## 2020-09-02 DIAGNOSIS — R2681 Unsteadiness on feet: Secondary | ICD-10-CM

## 2020-09-02 DIAGNOSIS — R2689 Other abnormalities of gait and mobility: Secondary | ICD-10-CM

## 2020-09-02 NOTE — Therapy (Signed)
[place] MAIN [facility] SERVICES [address] [place], [place], [address] Phone: [phone]   Fax:  [phone]  Physical Therapy Treatment  Patient Details  Name: Barbara Mclaughlin MRN: [id number] Date of Birth: [birth year] Referring Provider (PT): Fulton Reek   Encounter Date: 09/02/2020   PT End of Session - 09/02/20 1020    Visit Number 4    Number of Visits 25    Date for PT Re-Evaluation 11/12/20    Authorization Type Eval: 4/15; 1/10 PN    Authorization - Visit Number 1    Authorization - Number of Visits 10    PT Start Time [phone]    PT Stop Time 0920    PT Time Calculation (min) 46 min    Equipment Utilized During Treatment Gait belt    Activity Tolerance Patient tolerated treatment well   Increase in LBP at end of session   Behavior During Therapy [facility] for tasks assessed/performed           Past Medical History:  Diagnosis Date  . Acoustic neuroma (HCC)    right ear  . Anemia   . Arthritis   . B12 deficiency   . Basal cell carcinoma, eyelid   . Chronic fatigue syndrome   . Depression   . Dysrhythmia 0ne episode of tachycardia on 10/03/14  . GERD (gastroesophageal reflux disease)   . Headache   . Heart murmur bruit  . Hypertension   . Hyperthyroidism 01/05/2015  . Osteoporosis   . Palpitations   . Postablative hypothyroidism 09/03/2015  . SVT (supraventricular tachycardia) ([place])   . Thyroid nodule   . Vitamin D deficiency     Past Surgical History:  Procedure Laterality Date  . BREAST REDUCTION SURGERY  1990  . EYE SURGERY Bilateral cataract removal  . GASTRIC BYPASS    . LAPAROSCOPIC HYSTERECTOMY  07/2009  . REDUCTION MAMMAPLASTY Bilateral 20+ yrs ago  . TOTAL KNEE ARTHROPLASTY  2013   Dr. Marry Guan, left  . TOTAL KNEE ARTHROPLASTY Right 06/2013    There were no vitals filed for this visit.  TREATMENT  Neuromuscular re-education: CGA provided for all of the following exercises   Forward/backward  stepping over orange hurdle x multiple reps each LE with with decreasing levels of UE support for going forward, and at least one finger-touch support stepping backward. A few instances of knocking over hurdle/staggering with backward stepping.  Side-to-side step over hurdle with with intermittent UE support to no UE support on bar x 10 rep each direction. Pt rates exercise as difficult  Tandem gait- with SPC - 6x back and forth over 10 meter track. Greater difficulty balancing with RLE as stance leg, exhibits stepping strategy to maintain balance.  Staggered stance at support bar - 4x30 sec each LE. Pt reports greater difficulty with RLE and exhibits increased postural sway.  Gait along 10 m path with horizontal/vertical/diagonal head movements using SPC: Most challenged with RtoL diagonal. Increased gait variabliity, relies on stepping strategy, decreased gait speed, and increased UE support on cane.  Stepping forward/backward/side-to-side onto 6" step with decreasing levels of UE support. Pt hesitant to take steps.   Education provided throughout session via VC/TC and demonstration to facilitate movement at target joints and correct muscle activation for all testing and exercises performed.   A: Pt challenged with both static and dynamic balance tasks. She had most difficulty with diagonal head turns looking up to the R and down to the L, and had difficulty clearing  hurdle with forward/backward stepping. The pt will benefit from further skilled PT to improve BLE strength, gait mechanics and balance to reduce risk of falls.    PT Short Term Goals - 08/20/20 0926      PT SHORT TERM GOAL #1   Title Pt will be indep with strength and balance HEP to improve functional mobility.    Baseline 4/15: Initiate next session.    Time 6    Period Weeks    Status New    Target Date 10/01/20             PT Long Term Goals - 08/20/20 0929      PT LONG TERM GOAL #1   Title Pt will improve 5xSTS  to < 12 seconds with no UE support for clinically significant improvement in LE strength.    Baseline 4/15: 17.76 sec with hands on seat for assist.    Time 12    Period Weeks    Status New    Target Date 11/12/20      PT LONG TERM GOAL #2   Title Pt will improve 6MWT to > 1000' with no AD to demonstrate pt is at a decreased risk of falls for community ambulation distances.    Baseline 4/15: 825' with no AD. Variable step lengths intermittently and R antalgic gait.    Time 12    Period Weeks    Status New    Target Date 11/12/20      PT LONG TERM GOAL #3   Title Pt will improve FGA to 19 or greater to demonstrate pt is at a decreased risk of falls with community and household ADL's.    Baseline 4/15: 12/30    Time 12    Period Weeks    Status New    Target Date 11/12/20      PT LONG TERM GOAL #4   Title Pt will improve FOTO to target score of 60 to demonstrate improvements in functional mobility.    Baseline 4/15: 45/60    Time 12    Period Weeks    Status New    Target Date 11/12/20      PT LONG TERM GOAL #5   Title Pt will improve gait speed to at least 0.8 m/s with no AD and normalized gait to be at decreased risk of falls for houshold and short community distances.    Baseline 4/15: 0.739 m/s self selected speed. No AD with antalgic gait on RLE.    Time 12    Period Weeks    Status New    Target Date 11/12/20                 Plan - 09/02/20 1026    Clinical Impression Statement Pt challenged with both static and dynamic balance tasks. She had most difficulty with diagonal head turns looking up to the R and down to the L, and had difficulty clearing hurdle with forward/backward stepping. The pt will benefit from further skilled PT to improve BLE strength, gait mechanics and balance to reduce risk of falls.    Personal Factors and Comorbidities Age;Comorbidity 3+;Fitness;Time since onset of injury/illness/exacerbation    Comorbidities HTN, HLD, depression, High  risk medication use, hypothyroidism, obesity, OA, osteoporosis. Has history of hammer toes on second and third digit on L foot and stable, non-displaced fracture of second metatarsal on R foot that is being treated conservatively.    Examination-Activity Limitations Lift;Squat;Stairs;Locomotion Level;Transfers    Examination-Participation Restrictions  Cleaning;Shop;Community Activity;Yard Work    [employer] Evolving/Moderate complexity    Rehab Potential Good    PT Frequency 2x / week    PT Duration 12 weeks    PT Treatment/Interventions ADLs/Self Care Home Management;Cryotherapy;Electrical Stimulation;Moist Heat;DME Instruction;Gait training;Stair training;Functional mobility training;Therapeutic activities;Therapeutic exercise;Balance training;Neuromuscular re-education;Patient/family education;Energy conservation;Dry needling;Passive range of motion;Manual techniques    PT Next Visit Plan Reassess cane on level surfaces/stairs. Progress to community type tasks with cane. Develop LE strength HEP. Continue POC as previously indicated    PT Home Exercise Plan Next session. Begin use of cane on flat, level surface in home    Consulted and Agree with Plan of Care Patient           Patient will benefit from skilled therapeutic intervention in order to improve the following deficits and impairments:  Abnormal gait,Impaired sensation,Pain,Improper body mechanics,Decreased mobility,Decreased activity tolerance,Decreased endurance,Decreased strength,Decreased balance,Difficulty walking  Visit Diagnosis: Unsteadiness on feet  Other abnormalities of gait and mobility     Problem List Patient Active Problem List   Diagnosis Date Noted  . S/P gastric bypass 01/29/2020  . Elevated lipoprotein(a) 01/29/2020  . B12 deficiency 01/29/2020  . Migraines 09/10/2017  . AV nodal re-entry tachycardia ([place]) 11/03/2016  . Vitamin D deficiency, unspecified 06/12/2016  . Schwannoma  10/05/2015  . Postablative hypothyroidism 09/03/2015  . Memory change 07/06/2015  . Insomnia 06/04/2015  . Chronic fatigue 06/04/2015  . Hyperthyroidism 01/05/2015  . Atrophic vaginitis 01/05/2015  . SVT (supraventricular tachycardia) ([place]) 10/08/2014  . Thyroid nodule 08/11/2014  . Basal cell carcinoma of left eyelid 08/11/2014  . Anemia, iron deficiency 04/09/2014  . Acute low back pain 09/16/2013  . Dysphagia, pharyngoesophageal phase 04/29/2013  . Routine general medical examination at a health care facility 01/13/2013  . Anemia 01/11/2012  . Chronic pain 11/20/2011  . Screening for breast cancer 11/20/2011  . Arthritis 04/17/2011  . Hypertension 04/17/2011  . Depression, recurrent ([place]) 04/17/2011  . GERD (gastroesophageal reflux disease) 04/17/2011  . Menopausal and perimenopausal disorder 04/17/2011  . Overweight (BMI 25.0-29.9) 04/17/2011  . Pre-operative cardiovascular examination 09/19/2010  . Abnormal EKG 09/19/2010   Ricard Dillon PT, DPT 09/02/2020, 10:27 AM  [place] MAIN [facility] SERVICES [address] [place], [place], [address] Phone: [phone]   Fax:  [phone]  Name: Barbara Mclaughlin MRN: [id number] Date of Birth: Dec 30, 1950

## 2020-09-07 ENCOUNTER — Ambulatory Visit: Payer: PPO

## 2020-09-07 ENCOUNTER — Ambulatory Visit: Payer: PPO | Admitting: Physical Therapy

## 2020-09-09 ENCOUNTER — Ambulatory Visit: Payer: PPO | Attending: Internal Medicine

## 2020-09-09 ENCOUNTER — Other Ambulatory Visit: Payer: Self-pay

## 2020-09-09 DIAGNOSIS — R269 Unspecified abnormalities of gait and mobility: Secondary | ICD-10-CM | POA: Diagnosis not present

## 2020-09-09 DIAGNOSIS — R262 Difficulty in walking, not elsewhere classified: Secondary | ICD-10-CM | POA: Insufficient documentation

## 2020-09-09 DIAGNOSIS — R2689 Other abnormalities of gait and mobility: Secondary | ICD-10-CM | POA: Diagnosis not present

## 2020-09-09 DIAGNOSIS — R2681 Unsteadiness on feet: Secondary | ICD-10-CM | POA: Diagnosis not present

## 2020-09-09 DIAGNOSIS — R278 Other lack of coordination: Secondary | ICD-10-CM | POA: Diagnosis not present

## 2020-09-09 DIAGNOSIS — M6281 Muscle weakness (generalized): Secondary | ICD-10-CM | POA: Diagnosis not present

## 2020-09-09 NOTE — Therapy (Signed)
[place] MAIN [facility] SERVICES [address] [place], [place], [address] Phone: [phone]   Fax:  [phone]  Physical Therapy Treatment  Patient Details  Name: Barbara Mclaughlin MRN: [id number] Date of Birth: 12-Feb-1951 Referring Provider (PT): Fulton Reek   Encounter Date: 09/09/2020    Past Medical History:  Diagnosis Date  . Acoustic neuroma (HCC)    right ear  . Anemia   . Arthritis   . B12 deficiency   . Basal cell carcinoma, eyelid   . Chronic fatigue syndrome   . Depression   . Dysrhythmia 0ne episode of tachycardia on 10/03/14  . GERD (gastroesophageal reflux disease)   . Headache   . Heart murmur bruit  . Hypertension   . Hyperthyroidism 01/05/2015  . Osteoporosis   . Palpitations   . Postablative hypothyroidism 09/03/2015  . SVT (supraventricular tachycardia) ([place])   . Thyroid nodule   . Vitamin D deficiency     Past Surgical History:  Procedure Laterality Date  . BREAST REDUCTION SURGERY  1990  . EYE SURGERY Bilateral cataract removal  . GASTRIC BYPASS    . LAPAROSCOPIC HYSTERECTOMY  07/2009  . REDUCTION MAMMAPLASTY Bilateral 20+ yrs ago  . TOTAL KNEE ARTHROPLASTY  2013   Dr. Marry Guan, left  . TOTAL KNEE ARTHROPLASTY Right 06/2013    There were no vitals filed for this visit.      TREATMENT  Neuromuscular re-education: CGA provided for all of the following exercises   Forward/backward stepping over orange hurdle x multiple reps each LE with with 1 UE support, CGA with good ability and did not knowck over hurdle today. Side-to-side step over hurdle with with intermittent UE support to no UE support on bar x 10 rep each direction. Tandem gait- with SPC - 6x back and forth over 10 meter track. Greater difficulty balancing with RLE as stance leg, exhibits stepping strategy to maintain balance.    Static stand with feet together with EO, EC x 20 sec x 3 sets each - VC for correct technique- Patient with  mild increased sway with EC.   Static stand with feet Staggered- EO, EC, Head turns with EO and then Head turns with EC- 4 trials each - mild increased sway with EC and 1 UE support at times with EC and head turning.   Standing SLS- at bar with min to no UE- patient performed several tries each leg - able to hold 2-3 sec at most.   Tandem gait- forward/backward along single bar length area (4-5 steps forward then backward x 4 trials  Stepping forward/backward/side-to-side onto 6" step with decreasing levels of UE support. Pt hesitant to take steps.   Education provided throughout session via VC/TC and demonstration to facilitate movement at target joints and correct muscle activation for all testing and exercises performed.   A: Patient performed well with balance activities yet most challenged with any activity requiring eyes closed- demo increased sway and continued difficulty with performing SLS today. She was responsive to [facility] and motivated throughout the treatment today. Instructed her to continue to use her cane for safe mobility at this time.The pt will benefit from further skilled PT to improve BLE strength, gait mechanics and balance to reduce risk of falls.                            PT Short Term Goals - 08/20/20 0926      PT SHORT  TERM GOAL #1   Title Pt will be indep with strength and balance HEP to improve functional mobility.    Baseline 4/15: Initiate next session.    Time 6    Period Weeks    Status New    Target Date 10/01/20             PT Long Term Goals - 08/20/20 0929      PT LONG TERM GOAL #1   Title Pt will improve 5xSTS to < 12 seconds with no UE support for clinically significant improvement in LE strength.    Baseline 4/15: 17.76 sec with hands on seat for assist.    Time 12    Period Weeks    Status New    Target Date 11/12/20      PT LONG TERM GOAL #2   Title Pt will improve 6MWT to > 1000' with no AD to demonstrate pt is  at a decreased risk of falls for community ambulation distances.    Baseline 4/15: 825' with no AD. Variable step lengths intermittently and R antalgic gait.    Time 12    Period Weeks    Status New    Target Date 11/12/20      PT LONG TERM GOAL #3   Title Pt will improve FGA to 19 or greater to demonstrate pt is at a decreased risk of falls with community and household ADL's.    Baseline 4/15: 12/30    Time 12    Period Weeks    Status New    Target Date 11/12/20      PT LONG TERM GOAL #4   Title Pt will improve FOTO to target score of 60 to demonstrate improvements in functional mobility.    Baseline 4/15: 45/60    Time 12    Period Weeks    Status New    Target Date 11/12/20      PT LONG TERM GOAL #5   Title Pt will improve gait speed to at least 0.8 m/s with no AD and normalized gait to be at decreased risk of falls for houshold and short community distances.    Baseline 4/15: 0.739 m/s self selected speed. No AD with antalgic gait on RLE.    Time 12    Period Weeks    Status New    Target Date 11/12/20                  Patient will benefit from skilled therapeutic intervention in order to improve the following deficits and impairments:  Abnormal gait,Impaired sensation,Pain,Improper body mechanics,Decreased mobility,Decreased activity tolerance,Decreased endurance,Decreased strength,Decreased balance,Difficulty walking  Visit Diagnosis: Abnormality of gait and mobility  Difficulty in walking, not elsewhere classified  Muscle weakness (generalized)  Other lack of coordination     Problem List Patient Active Problem List   Diagnosis Date Noted  . S/P gastric bypass 01/29/2020  . Elevated lipoprotein(a) 01/29/2020  . B12 deficiency 01/29/2020  . Migraines 09/10/2017  . AV nodal re-entry tachycardia ([place]) 11/03/2016  . Vitamin D deficiency, unspecified 06/12/2016  . Schwannoma 10/05/2015  . Postablative hypothyroidism 09/03/2015  . Memory change  07/06/2015  . Insomnia 06/04/2015  . Chronic fatigue 06/04/2015  . Hyperthyroidism 01/05/2015  . Atrophic vaginitis 01/05/2015  . SVT (supraventricular tachycardia) ([place]) 10/08/2014  . Thyroid nodule 08/11/2014  . Basal cell carcinoma of left eyelid 08/11/2014  . Anemia, iron deficiency 04/09/2014  . Acute low back pain 09/16/2013  . Dysphagia, pharyngoesophageal phase 04/29/2013  .  Routine general medical examination at a health care facility 01/13/2013  . Anemia 01/11/2012  . Chronic pain 11/20/2011  . Screening for breast cancer 11/20/2011  . Arthritis 04/17/2011  . Hypertension 04/17/2011  . Depression, recurrent ([place]) 04/17/2011  . GERD (gastroesophageal reflux disease) 04/17/2011  . Menopausal and perimenopausal disorder 04/17/2011  . Overweight (BMI 25.0-29.9) 04/17/2011  . Pre-operative cardiovascular examination 09/19/2010  . Abnormal EKG 09/19/2010    Lewis Moccasin, PT 09/13/2020, 6:04 AM  [place] MAIN [facility] SERVICES [address] [place], [place], [address] Phone: [phone]   Fax:  [phone]  Name: DYLIN IHNEN MRN: [id number] Date of Birth: 04-07-1951

## 2020-09-14 ENCOUNTER — Ambulatory Visit: Payer: PPO

## 2020-09-14 ENCOUNTER — Other Ambulatory Visit: Payer: Self-pay

## 2020-09-14 DIAGNOSIS — R2689 Other abnormalities of gait and mobility: Secondary | ICD-10-CM

## 2020-09-14 DIAGNOSIS — R269 Unspecified abnormalities of gait and mobility: Secondary | ICD-10-CM | POA: Diagnosis not present

## 2020-09-14 DIAGNOSIS — R2681 Unsteadiness on feet: Secondary | ICD-10-CM

## 2020-09-14 NOTE — Therapy (Signed)
[place] MAIN [facility] SERVICES [address] [place], [place], [address] Phone: [phone]   Fax:  [phone]  Physical Therapy Treatment  Patient Details  Name: Barbara Mclaughlin MRN: [id number] Date of Birth: 1951-01-06 Referring Provider (PT): Fulton Reek   Encounter Date: 09/14/2020   PT End of Session - 09/14/20 1034    Visit Number 6    Number of Visits 25    Date for PT Re-Evaluation 11/12/20    Authorization Type Eval: 4/15; 1/10 PN    Authorization - Visit Number 1    Authorization - Number of Visits 10    PT Start Time 0945    PT Stop Time 1021    PT Time Calculation (min) 36 min    Equipment Utilized During Treatment Gait belt    Activity Tolerance Patient tolerated treatment well   Increase in LBP at end of session   Behavior During Therapy [facility] for tasks assessed/performed           Past Medical History:  Diagnosis Date  . Acoustic neuroma (HCC)    right ear  . Anemia   . Arthritis   . B12 deficiency   . Basal cell carcinoma, eyelid   . Chronic fatigue syndrome   . Depression   . Dysrhythmia 0ne episode of tachycardia on 10/03/14  . GERD (gastroesophageal reflux disease)   . Headache   . Heart murmur bruit  . Hypertension   . Hyperthyroidism 01/05/2015  . Osteoporosis   . Palpitations   . Postablative hypothyroidism 09/03/2015  . SVT (supraventricular tachycardia) ([place])   . Thyroid nodule   . Vitamin D deficiency     Past Surgical History:  Procedure Laterality Date  . BREAST REDUCTION SURGERY  1990  . EYE SURGERY Bilateral cataract removal  . GASTRIC BYPASS    . LAPAROSCOPIC HYSTERECTOMY  07/2009  . REDUCTION MAMMAPLASTY Bilateral 20+ yrs ago  . TOTAL KNEE ARTHROPLASTY  2013   Dr. Marry Guan, left  . TOTAL KNEE ARTHROPLASTY Right 06/2013    There were no vitals filed for this visit.   Subjective Assessment - 09/14/20 1033    Subjective Pt rates back pain as currently 4/10. Denies fall or  near-falls. Reports doing some HEP.    Pertinent History Pt is a 70 y.o. female referred to PT for balance impairment. PMH includes: HTN, HLD, dpression, High risk medication use, hypothyroidism, obesity, OA, osteoporosis. Has history of hammer toes on second and third digit on L foot and stable, non-displaced fracture of second metatarsal on R foot that is being treated conservatively. Pt reports pain in her R knee 3/10 from twisting her right knee getting out of her car. Pt reports neuropathy diagnosis where she was referred to neurologist. Pt reports in her community, she feels like she has to hold onto something or someone and feels unsteady. Most difficulty with stepping up onto curbs. Pt reports having a fall 2 months ago in grass due to the uneven surface. This is the only fall that has occured in the last 6 months. Pt believes she always has to look down at her feet due to uneven surfaces for her safety. Pt reports utilizing a walking stick or holding onto her husband when walking up and down the road on average 1x/week and with community tasks. When in community with shopping tasks, she utilizes a shopping cart for her balance when husband is not with her. Pt's goal is to improve her walking and be able  to asc/desc curbs safely.    Limitations Standing;Walking;House hold activities    How long can you sit comfortably? Unlimited    How long can you stand comfortably? 30 min    How long can you walk comfortably? household is unlimited with furniture walking. With walking stick, 20 min.    Patient Stated Goals Be able to asc/desc curb and improve walking.    Currently in Pain? Yes    Pain Score 4     Pain Location Back    Pain Orientation Posterior;Lower    Pain Onset More than a month ago            TREATMENT  Neuromuscular re-education: CGA-min assist provided for all of the following exercises   Tandem gait- 2x2 along 10 meter track. Greater difficulty observed with RLE as stance leg.  Pt rates very difficult. Requires up to min assist from PT to maintain balance.  Forward/backward stepping over orange hurdle- x multiple reps each LE. Pt with decreasing amounts of UE support (from SUE to no UE support), CGA. Pt expresses FOF with exercise. Hesitates, exhibits decreased step-length with less UE support.  Backward toe-taps on hedgehogs - 2x15 each LE, intermittent UE support, CGA-min assist  Static stand with feet together with EO, EC x 30 sec each, progress to EO with vertical/horizontal head turns for 30 sec each. Pt with increased sway but no LOB with EC, vertical or horizontal head turns.  Static stand with feet Staggered- EO 2x30 sec each LE;  Progressed to use of vertical and horizontal head turns with EO 2x30 sec each. Mild increases in sway, intermittent UE support on bar required.  Education provided throughout session via VC/TC and demonstration to facilitate movement at target joints and correct muscle activation for all testing and exercises performed. Pt exhibits good carryover within session after cuing.    A: Pt still has difficulty with staggered/tandem stance activities (both dynamic and static), requiring up to min assist x1. She expresses FOF with backward stepping over hurdle with decreased postural stability, possibly due to hesitation to perform exercise. Her balance with backward-stepping did improve with hedgehog toe-tap exercise. The pt will benefit from further skilled therapy to improve both static and dynamic balance to decrease fall risk and increase safety with gait and functional mobility.      PT Education - 09/14/20 1034    Education Details body mechanics, exercise technique    Person(s) Educated Patient    Methods Explanation;Demonstration;Verbal cues    Comprehension Verbalized understanding;Returned demonstration            PT Short Term Goals - 08/20/20 0926      PT SHORT TERM GOAL #1   Title Pt will be indep with strength and  balance HEP to improve functional mobility.    Baseline 4/15: Initiate next session.    Time 6    Period Weeks    Status New    Target Date 10/01/20             PT Long Term Goals - 08/20/20 0929      PT LONG TERM GOAL #1   Title Pt will improve 5xSTS to < 12 seconds with no UE support for clinically significant improvement in LE strength.    Baseline 4/15: 17.76 sec with hands on seat for assist.    Time 12    Period Weeks    Status New    Target Date 11/12/20      PT LONG TERM  GOAL #2   Title Pt will improve 6MWT to > 1000' with no AD to demonstrate pt is at a decreased risk of falls for community ambulation distances.    Baseline 4/15: 825' with no AD. Variable step lengths intermittently and R antalgic gait.    Time 12    Period Weeks    Status New    Target Date 11/12/20      PT LONG TERM GOAL #3   Title Pt will improve FGA to 19 or greater to demonstrate pt is at a decreased risk of falls with community and household ADL's.    Baseline 4/15: 12/30    Time 12    Period Weeks    Status New    Target Date 11/12/20      PT LONG TERM GOAL #4   Title Pt will improve FOTO to target score of 60 to demonstrate improvements in functional mobility.    Baseline 4/15: 45/60    Time 12    Period Weeks    Status New    Target Date 11/12/20      PT LONG TERM GOAL #5   Title Pt will improve gait speed to at least 0.8 m/s with no AD and normalized gait to be at decreased risk of falls for houshold and short community distances.    Baseline 4/15: 0.739 m/s self selected speed. No AD with antalgic gait on RLE.    Time 12    Period Weeks    Status New    Target Date 11/12/20                 Plan - 09/14/20 1034    Clinical Impression Statement Pt still has difficulty with staggered/tandem stance activities (both dynamic and static), requiring up to min assist x1. She expresses FOF with backward stepping over hurdle with decreased postural stability, possibly due to  hesitation to perform exercise. Her balance with backward-stepping did improve with hedgehog toe-tap exercise. The pt will benefit from further skilled therapy to improve both static and dynamic balance to decrease fall risk and increase safety with gait and functional mobility.    Personal Factors and Comorbidities Age;Comorbidity 3+;Fitness;Time since onset of injury/illness/exacerbation    Comorbidities HTN, HLD, depression, High risk medication use, hypothyroidism, obesity, OA, osteoporosis. Has history of hammer toes on second and third digit on L foot and stable, non-displaced fracture of second metatarsal on R foot that is being treated conservatively.    Examination-Activity Limitations Lift;Squat;Stairs;Locomotion Level;Transfers    Examination-Participation Restrictions Cleaning;Shop;Community Activity;Yard Work    [employer] Evolving/Moderate complexity    Rehab Potential Good    PT Frequency 2x / week    PT Duration 12 weeks    PT Treatment/Interventions ADLs/Self Care Home Management;Cryotherapy;Electrical Stimulation;Moist Heat;DME Instruction;Gait training;Stair training;Functional mobility training;Therapeutic activities;Therapeutic exercise;Balance training;Neuromuscular re-education;Patient/family education;Energy conservation;Dry needling;Passive range of motion;Manual techniques    PT Next Visit Plan Reassess cane on level surfaces/stairs. Progress to community type tasks with cane. Develop LE strength HEP. Continue POC as previously indicated    PT Home Exercise Plan Next session. Begin use of cane on flat, level surface in home    Consulted and Agree with Plan of Care Patient           Patient will benefit from skilled therapeutic intervention in order to improve the following deficits and impairments:  Abnormal gait,Impaired sensation,Pain,Improper body mechanics,Decreased mobility,Decreased activity tolerance,Decreased endurance,Decreased  strength,Decreased balance,Difficulty walking  Visit Diagnosis: Unsteadiness on feet  Other abnormalities  of gait and mobility     Problem List Patient Active Problem List   Diagnosis Date Noted  . S/P gastric bypass 01/29/2020  . Elevated lipoprotein(a) 01/29/2020  . B12 deficiency 01/29/2020  . Migraines 09/10/2017  . AV nodal re-entry tachycardia ([place]) 11/03/2016  . Vitamin D deficiency, unspecified 06/12/2016  . Schwannoma 10/05/2015  . Postablative hypothyroidism 09/03/2015  . Memory change 07/06/2015  . Insomnia 06/04/2015  . Chronic fatigue 06/04/2015  . Hyperthyroidism 01/05/2015  . Atrophic vaginitis 01/05/2015  . SVT (supraventricular tachycardia) ([place]) 10/08/2014  . Thyroid nodule 08/11/2014  . Basal cell carcinoma of left eyelid 08/11/2014  . Anemia, iron deficiency 04/09/2014  . Acute low back pain 09/16/2013  . Dysphagia, pharyngoesophageal phase 04/29/2013  . Routine general medical examination at a health care facility 01/13/2013  . Anemia 01/11/2012  . Chronic pain 11/20/2011  . Screening for breast cancer 11/20/2011  . Arthritis 04/17/2011  . Hypertension 04/17/2011  . Depression, recurrent ([place]) 04/17/2011  . GERD (gastroesophageal reflux disease) 04/17/2011  . Menopausal and perimenopausal disorder 04/17/2011  . Overweight (BMI 25.0-29.9) 04/17/2011  . Pre-operative cardiovascular examination 09/19/2010  . Abnormal EKG 09/19/2010   Ricard Dillon PT, DPT 09/14/2020, 10:44 AM  [place] MAIN [facility] SERVICES [address] [place], [place], [address] Phone: [phone]   Fax:  [phone]  Name: CARLETHA DAWN MRN: [id number] Date of Birth: [birth year]

## 2020-09-15 DIAGNOSIS — M461 Sacroiliitis, not elsewhere classified: Secondary | ICD-10-CM | POA: Diagnosis not present

## 2020-09-15 DIAGNOSIS — M47816 Spondylosis without myelopathy or radiculopathy, lumbar region: Secondary | ICD-10-CM | POA: Diagnosis not present

## 2020-09-15 DIAGNOSIS — Z79899 Other long term (current) drug therapy: Secondary | ICD-10-CM | POA: Diagnosis not present

## 2020-09-15 DIAGNOSIS — M5136 Other intervertebral disc degeneration, lumbar region: Secondary | ICD-10-CM | POA: Diagnosis not present

## 2020-09-16 ENCOUNTER — Ambulatory Visit: Payer: PPO

## 2020-09-23 DIAGNOSIS — M9903 Segmental and somatic dysfunction of lumbar region: Secondary | ICD-10-CM | POA: Diagnosis not present

## 2020-09-23 DIAGNOSIS — M5441 Lumbago with sciatica, right side: Secondary | ICD-10-CM | POA: Diagnosis not present

## 2020-09-27 DIAGNOSIS — F331 Major depressive disorder, recurrent, moderate: Secondary | ICD-10-CM | POA: Diagnosis not present

## 2020-09-27 DIAGNOSIS — F5105 Insomnia due to other mental disorder: Secondary | ICD-10-CM | POA: Diagnosis not present

## 2020-09-29 DIAGNOSIS — M5441 Lumbago with sciatica, right side: Secondary | ICD-10-CM | POA: Diagnosis not present

## 2020-09-29 DIAGNOSIS — M9903 Segmental and somatic dysfunction of lumbar region: Secondary | ICD-10-CM | POA: Diagnosis not present

## 2020-10-06 DIAGNOSIS — M5441 Lumbago with sciatica, right side: Secondary | ICD-10-CM | POA: Diagnosis not present

## 2020-10-06 DIAGNOSIS — M9903 Segmental and somatic dysfunction of lumbar region: Secondary | ICD-10-CM | POA: Diagnosis not present

## 2020-10-08 DIAGNOSIS — M5441 Lumbago with sciatica, right side: Secondary | ICD-10-CM | POA: Diagnosis not present

## 2020-10-08 DIAGNOSIS — M9903 Segmental and somatic dysfunction of lumbar region: Secondary | ICD-10-CM | POA: Diagnosis not present

## 2020-10-11 DIAGNOSIS — M9903 Segmental and somatic dysfunction of lumbar region: Secondary | ICD-10-CM | POA: Diagnosis not present

## 2020-10-11 DIAGNOSIS — M5441 Lumbago with sciatica, right side: Secondary | ICD-10-CM | POA: Diagnosis not present

## 2020-10-15 DIAGNOSIS — M5441 Lumbago with sciatica, right side: Secondary | ICD-10-CM | POA: Diagnosis not present

## 2020-10-15 DIAGNOSIS — M9903 Segmental and somatic dysfunction of lumbar region: Secondary | ICD-10-CM | POA: Diagnosis not present

## 2020-10-23 DIAGNOSIS — F5105 Insomnia due to other mental disorder: Secondary | ICD-10-CM | POA: Diagnosis not present

## 2020-10-23 DIAGNOSIS — F331 Major depressive disorder, recurrent, moderate: Secondary | ICD-10-CM | POA: Diagnosis not present

## 2020-10-28 DIAGNOSIS — J208 Acute bronchitis due to other specified organisms: Secondary | ICD-10-CM | POA: Diagnosis not present

## 2020-10-28 DIAGNOSIS — U071 COVID-19: Secondary | ICD-10-CM | POA: Diagnosis not present

## 2020-11-09 DIAGNOSIS — R269 Unspecified abnormalities of gait and mobility: Secondary | ICD-10-CM

## 2020-11-09 DIAGNOSIS — R262 Difficulty in walking, not elsewhere classified: Secondary | ICD-10-CM

## 2020-11-09 DIAGNOSIS — M6281 Muscle weakness (generalized): Secondary | ICD-10-CM

## 2020-11-09 NOTE — Therapy (Signed)
[place] MAIN [facility] SERVICES [address] [place], [place], [address] Phone: [phone]   Fax:  [phone]  November 09, 2020   No Recipients  Physical Therapy Discharge Summary  Patient: Barbara Mclaughlin  MRN: [id number]  Date of Birth: Apr 15, 1951   Diagnosis: Abnormality of gait and mobility  Difficulty in walking, not elsewhere classified  Muscle weakness (generalized) Referring Provider (PT): Fulton Reek   The above patient had been seen in Physical Therapy 6  times of 12  treatments scheduled with 0 no shows and 6 cancellations.  The treatment consisted of Therapeutic exercises and balance training The patient is:  Unknown status  Subjective: Patient cancelled remaining visits  Discharge Findings: Unable to address due to patient self discharge  Functional Status at Discharge: Unknown  Unable to assess goals due to patient cancelling visits    Sincerely,   Lewis Moccasin, PT   CC No Recipients  [place] [address] [place], [place], [address] Phone: [phone]   Fax:  [phone]  Patient: [name]  MRN: [id number]  Date of Birth: 03/26/1951

## 2020-11-17 DIAGNOSIS — M5441 Lumbago with sciatica, right side: Secondary | ICD-10-CM | POA: Diagnosis not present

## 2020-11-17 DIAGNOSIS — M9903 Segmental and somatic dysfunction of lumbar region: Secondary | ICD-10-CM | POA: Diagnosis not present

## 2020-11-22 ENCOUNTER — Ambulatory Visit: Payer: PPO | Admitting: Anesthesiology

## 2020-11-22 ENCOUNTER — Encounter
Admission: RE | Disposition: A | Discharge status: Home / Self Care | Payer: Self-pay | Source: Home / Self Care | Attending: Gastroenterology

## 2020-11-22 ENCOUNTER — Encounter: Payer: Self-pay | Admitting: *Deleted

## 2020-11-22 ENCOUNTER — Ambulatory Visit
Admission: RE | Admit: 2020-11-22 | Discharge: 2020-11-22 | Disposition: A | Discharge status: Home / Self Care | Payer: PPO | Attending: Gastroenterology | Admitting: Gastroenterology

## 2020-11-22 DIAGNOSIS — Z791 Long term (current) use of non-steroidal anti-inflammatories (NSAID): Secondary | ICD-10-CM | POA: Insufficient documentation

## 2020-11-22 DIAGNOSIS — Z96653 Presence of artificial knee joint, bilateral: Secondary | ICD-10-CM | POA: Insufficient documentation

## 2020-11-22 DIAGNOSIS — D125 Benign neoplasm of sigmoid colon: Secondary | ICD-10-CM | POA: Insufficient documentation

## 2020-11-22 DIAGNOSIS — Z888 Allergy status to other drugs, medicaments and biological substances status: Secondary | ICD-10-CM | POA: Diagnosis not present

## 2020-11-22 DIAGNOSIS — Z79899 Other long term (current) drug therapy: Secondary | ICD-10-CM | POA: Insufficient documentation

## 2020-11-22 DIAGNOSIS — R1310 Dysphagia, unspecified: Secondary | ICD-10-CM | POA: Diagnosis not present

## 2020-11-22 DIAGNOSIS — Z85828 Personal history of other malignant neoplasm of skin: Secondary | ICD-10-CM | POA: Diagnosis not present

## 2020-11-22 DIAGNOSIS — R1084 Generalized abdominal pain: Secondary | ICD-10-CM | POA: Diagnosis not present

## 2020-11-22 DIAGNOSIS — Z980 Intestinal bypass and anastomosis status: Secondary | ICD-10-CM | POA: Diagnosis not present

## 2020-11-22 DIAGNOSIS — D127 Benign neoplasm of rectosigmoid junction: Secondary | ICD-10-CM | POA: Diagnosis not present

## 2020-11-22 DIAGNOSIS — Z9884 Bariatric surgery status: Secondary | ICD-10-CM | POA: Insufficient documentation

## 2020-11-22 DIAGNOSIS — K635 Polyp of colon: Secondary | ICD-10-CM | POA: Diagnosis not present

## 2020-11-22 HISTORY — PX: COLONOSCOPY WITH PROPOFOL: SHX5780

## 2020-11-22 HISTORY — PX: ESOPHAGOGASTRODUODENOSCOPY: SHX5428

## 2020-11-22 SURGERY — COLONOSCOPY WITH PROPOFOL
Anesthesia: General

## 2020-11-22 MED ORDER — PROPOFOL 10 MG/ML IV BOLUS
INTRAVENOUS | Status: DC | PRN
  Administered 2020-11-22: 50 mg via INTRAVENOUS

## 2020-11-22 MED ORDER — LIDOCAINE HCL (PF) 2 % IJ SOLN
INTRAMUSCULAR | Status: DC | PRN
  Administered 2020-11-22: 80 mg via INTRADERMAL

## 2020-11-22 MED ORDER — PROPOFOL 500 MG/50ML IV EMUL
INTRAVENOUS | Status: DC | PRN
  Administered 2020-11-22: 200 ug/kg/min via INTRAVENOUS

## 2020-11-22 MED ORDER — SODIUM CHLORIDE 0.9 % IV SOLN: INTRAVENOUS | Status: DC

## 2020-11-22 NOTE — H&P (Signed)
Outpatient short stay form Pre-procedure 11/22/2020 12:28 PM Barbara Miyamoto MD, MPH  Primary Physician: Dr. Doy Hutching  Reason for visit:  Dysphagia  History of present illness:   70 y/o lady with history of gastric bypass and hysterectomy here for EGD for dysphagia to solids that has been going on for years that has been persistent. Colonoscopy for abdominal pain. Normal procedures in 2015. No blood thinners. No family history of GI malignancies.    Current Facility-Administered Medications:    0.9 %  sodium chloride infusion, , Intravenous, Continuous, Kipp Shank, Hilton Cork, MD  Medications Prior to Admission  Medication Sig Dispense Refill Last Dose   carvedilol (COREG) 3.125 MG tablet Take by mouth.   11/22/2020 at 0700   losartan (COZAAR) 50 MG tablet TAKE ONE TABLET BY MOUTH ONCE DAILY 90 tablet 3 11/22/2020 at 0700   ALPRAZolam (XANAX) 0.25 MG tablet Take by mouth.      bisacodyl (BISACODYL) 5 MG EC tablet Take 5 mg by mouth daily as needed.       buPROPion (WELLBUTRIN XL) 300 MG 24 hr tablet       celecoxib (CELEBREX) 100 MG capsule TAKE ONE CAPSULE BY MOUTH TWICE DAILY 180 capsule 0    Cholecalciferol (VITAMIN D3) 2000 UNITS capsule Take 2,000 Units by mouth daily.      Cyanocobalamin (VITAMIN B-12) 250 MCG TABS Take 250 mcg by mouth daily.      docusate sodium (COLACE) 100 MG capsule Take by mouth.      esomeprazole (NEXIUM) 40 MG capsule Take 80 mg by mouth every morning.      ferrous sulfate 325 (65 FE) MG tablet Take by mouth.      fluocinonide ointment (LIDEX) 0.05 % Apply topically 2 (two) times daily. 30 g 0    FLUoxetine (PROZAC) 20 MG capsule TAKE TWO CAPSULES BY MOUTH IN THE MORNING AND TAKE ONE CAPSULE BY MOUTH IN THE EVENING 270 capsule 0    levothyroxine (SYNTHROID, LEVOTHROID) 25 MCG tablet Take 75 mcg by mouth.       loratadine (CLARITIN) 10 MG tablet Take 10 mg by mouth daily.      Multiple Vitamin (MULTIVITAMIN) capsule Take 1 capsule by mouth daily.       oxyCODONE-acetaminophen (PERCOCET/ROXICET) 5-325 MG tablet Take by mouth.      predniSONE (DELTASONE) 10 MG tablet Taper 6-5-4-3-2-1-off      ranitidine (ZANTAC) 150 MG tablet Take by mouth.      Vitamin D, Ergocalciferol, (DRISDOL) 1.25 MG (50000 UNIT) CAPS capsule Take 1 capsule (50,000 Units total) by mouth every 7 (seven) days. 4 capsule 0      Allergies  Allergen Reactions   Effexor [Venlafaxine]     agitation   Topamax [Topiramate]     Tingling in arms     Past Medical History:  Diagnosis Date   Acoustic neuroma ([place])    right ear   Anemia    Arthritis    B12 deficiency    Basal cell carcinoma, eyelid    Chronic fatigue syndrome    Depression    Dysrhythmia 0ne episode of tachycardia on 10/03/14   GERD (gastroesophageal reflux disease)    Headache    Heart murmur bruit   Hypertension    Hyperthyroidism 01/05/2015   Osteoporosis    Palpitations    Postablative hypothyroidism 09/03/2015   SVT (supraventricular tachycardia) (HCC)    Thyroid nodule    Vitamin D deficiency     Review of systems:  Otherwise negative.  Physical Exam  Gen: Alert, oriented. Appears stated age.  HEENT: PERRLA. Lungs: No respiratory distress CV: RRR Abd: soft, benign, no masses Ext: No edema    Planned procedures: Proceed with colonoscopy. The patient understands the nature of the planned procedure, indications, risks, alternatives and potential complications including but not limited to bleeding, infection, perforation, damage to internal organs and possible oversedation/side effects from anesthesia. The patient agrees and gives consent to proceed.  Please refer to procedure notes for findings, recommendations and patient disposition/instructions.     Barbara Miyamoto MD, MPH Gastroenterology 11/22/2020  12:28 PM

## 2020-11-22 NOTE — Op Note (Signed)
[facility] Gastroenterology Patient Name: Barbara Mclaughlin Procedure Date: 11/22/2020 11:47 AM MRN: [id number] Account #: [id number] Date of Birth: 07-Nov-1950 Admit Type: Outpatient Age: 70 Room: [facility] ENDO ROOM 3 Gender: Female Note Status: Finalized Procedure:             Upper GI endoscopy Indications:           Dyspepsia, Dysphagia Providers:             Andrey Farmer MD, MD Medicines:             Monitored Anesthesia Care Complications:         No immediate complications. Procedure:             Pre-Anesthesia Assessment:                        - Prior to the procedure, a History and Physical was                         performed, and patient medications and allergies were                         reviewed. The patient is competent. The risks and                         benefits of the procedure and the sedation options and                         risks were discussed with the patient. All questions                         were answered and informed consent was obtained.                         Patient identification and proposed procedure were                         verified by the physician, the nurse, the anesthetist                         and the technician in the endoscopy suite. Mental                         Status Examination: alert and oriented. Airway                         Examination: normal oropharyngeal airway and neck                         mobility. Respiratory Examination: clear to                         auscultation. CV Examination: normal. Prophylactic                         Antibiotics: The patient does not require prophylactic                         antibiotics. Prior Anticoagulants: The patient has  taken no previous anticoagulant or antiplatelet                         agents. ASA Grade Assessment: II - A patient with mild                         systemic disease. After reviewing the risks and                          benefits, the patient was deemed in satisfactory                         condition to undergo the procedure. The anesthesia                         plan was to use monitored anesthesia care (MAC).                         Immediately prior to administration of medications,                         the patient was re-assessed for adequacy to receive                         sedatives. The heart rate, respiratory rate, oxygen                         saturations, blood pressure, adequacy of pulmonary                         ventilation, and response to care were monitored                         throughout the procedure. The physical status of the                         patient was re-assessed after the procedure.                        After obtaining informed consent, the endoscope was                         passed under direct vision. Throughout the procedure,                         the patient's blood pressure, pulse, and oxygen                         saturations were monitored continuously. The Endoscope                         was introduced through the mouth, and advanced to the                         operative stoma of duodenum. The upper GI endoscopy                         was accomplished without difficulty. The patient  tolerated the procedure well. Findings:      No endoscopic abnormality was evident in the esophagus to explain the       patient's complaint of dysphagia.      Evidence of a Roux-en-Y gastrojejunostomy was found. The gastrojejunal       anastomosis was characterized by healthy appearing mucosa.      The examined jejunum was normal. Impression:            - No endoscopic esophageal abnormality to explain                         patient's dysphagia.                        - Roux-en-Y gastrojejunostomy with gastrojejunal                         anastomosis characterized by healthy appearing mucosa.                        - Normal  examined jejunum.                        - No specimens collected. Recommendation:        - Perform a barium swallow using barium in liquid and                         tablet form if symptoms persist.                        - Perform a colonoscopy today. Procedure Code(s):     --- Professional ---                        [id number], Esophagogastroduodenoscopy, flexible,                         transoral; diagnostic, including collection of                         specimen(s) by brushing or washing, when performed                         (separate procedure) Diagnosis Code(s):     --- Professional ---                        R13.10, Dysphagia, unspecified                        Z98.0, Intestinal bypass and anastomosis status                        R10.13, Epigastric pain CPT copyright 2019 American Medical Association. All rights reserved. The codes documented in this report are preliminary and upon coder review may  be revised to meet current compliance requirements. Andrey Farmer MD, MD 11/22/2020 1:18:05 PM Number of Addenda: 0 Note Initiated On: 11/22/2020 11:47 AM Estimated Blood Loss:  Estimated blood loss: none.      [facility]

## 2020-11-22 NOTE — Anesthesia Postprocedure Evaluation (Signed)
Anesthesia Post Note  Patient: Barbara Mclaughlin  Procedure(s) Performed: COLONOSCOPY WITH PROPOFOL ESOPHAGOGASTRODUODENOSCOPY (EGD)  Patient location during evaluation: PACU Anesthesia Type: General Level of consciousness: awake and alert Pain management: pain level controlled Vital Signs Assessment: post-procedure vital signs reviewed and stable Respiratory status: spontaneous breathing, nonlabored ventilation, respiratory function stable and patient connected to nasal cannula oxygen Cardiovascular status: blood pressure returned to baseline and stable Postop Assessment: no apparent nausea or vomiting Anesthetic complications: no   No notable events documented.   Last Vitals:  Vitals:   11/22/20 1328 11/22/20 1347  BP: (!) 150/82 (!) 160/65  Pulse: 62 (!) 56  Resp: 19 (!) 25  Temp:    SpO2: 99% 99%    Last Pain:  Vitals:   11/22/20 1347  TempSrc:   PainSc: 0-No pain                 [place] Ouch

## 2020-11-22 NOTE — Transfer of Care (Signed)
Immediate Anesthesia Transfer of Care Note  Patient: Barbara Mclaughlin  Procedure(s) Performed: COLONOSCOPY WITH PROPOFOL ESOPHAGOGASTRODUODENOSCOPY (EGD)  Patient Location: PACU  Anesthesia Type:General  Level of Consciousness: awake, alert  and oriented  Airway & Oxygen Therapy: Patient Spontanous Breathing and Patient connected to nasal cannula oxygen  Post-op Assessment: Report given to RN and Post -op Vital signs reviewed and stable  Post vital signs: Reviewed and stable  Last Vitals:  Vitals Value Taken Time  BP 129/93 11/22/20 1318  Temp    Pulse 68 11/22/20 1318  Resp 17 11/22/20 1318  SpO2 99 % 11/22/20 1318  Vitals shown include unvalidated device data.  Last Pain:  Vitals:   11/22/20 1318  TempSrc:   PainSc: 0-No pain         Complications: No notable events documented.

## 2020-11-22 NOTE — Interval H&P Note (Signed)
History and Physical Interval Note:  11/22/2020 12:30 PM  Barbara Mclaughlin  has presented today for surgery, with the diagnosis of [name].  The various methods of treatment have been discussed with the patient and family. After consideration of risks, benefits and other options for treatment, the patient has consented to  Procedure(s): COLONOSCOPY WITH PROPOFOL (N/A) ESOPHAGOGASTRODUODENOSCOPY (EGD) as a surgical intervention.  The patient's history has been reviewed, patient examined, no change in status, stable for surgery.  I have reviewed the patient's chart and labs.  Questions were answered to the patient's satisfaction.     Lesly Rubenstein  Ok to proceed with EGD/Colonoscopy

## 2020-11-22 NOTE — Op Note (Signed)
[facility] Gastroenterology Patient Name: Barbara Mclaughlin Procedure Date: 11/22/2020 11:47 AM MRN: [id number] Account #: [id number] Date of Birth: [birth year] Admit Type: Outpatient Age: 70 Room: [facility] ENDO ROOM 3 Gender: Female Note Status: Finalized Procedure:             Colonoscopy Indications:           Last colonoscopy: 2015, Generalized abdominal pain Providers:             Andrey Farmer MD, MD Medicines:             Monitored Anesthesia Care Complications:         No immediate complications. Estimated blood loss:                         Minimal. Procedure:             Pre-Anesthesia Assessment:                        - Prior to the procedure, a History and Physical was                         performed, and patient medications and allergies were                         reviewed. The patient is competent. The risks and                         benefits of the procedure and the sedation options and                         risks were discussed with the patient. All questions                         were answered and informed consent was obtained.                         Patient identification and proposed procedure were                         verified by the physician, the nurse, the anesthetist                         and the technician in the endoscopy suite. Mental                         Status Examination: alert and oriented. Airway                         Examination: normal oropharyngeal airway and neck                         mobility. Respiratory Examination: clear to                         auscultation. CV Examination: normal. Prophylactic                         Antibiotics: The patient does not require prophylactic  antibiotics. Prior Anticoagulants: The patient has                         taken no previous anticoagulant or antiplatelet                         agents. ASA Grade Assessment: II - A patient with mild                          systemic disease. After reviewing the risks and                         benefits, the patient was deemed in satisfactory                         condition to undergo the procedure. The anesthesia                         plan was to use monitored anesthesia care (MAC).                         Immediately prior to administration of medications,                         the patient was re-assessed for adequacy to receive                         sedatives. The heart rate, respiratory rate, oxygen                         saturations, blood pressure, adequacy of pulmonary                         ventilation, and response to care were monitored                         throughout the procedure. The physical status of the                         patient was re-assessed after the procedure.                        After obtaining informed consent, the colonoscope was                         passed under direct vision. Throughout the procedure,                         the patient's blood pressure, pulse, and oxygen                         saturations were monitored continuously. The                         Colonoscope was introduced through the anus and                         advanced to the the terminal ileum. The colonoscopy  was somewhat difficult due to significant looping.                         Successful completion of the procedure was aided by                         applying abdominal pressure. The patient tolerated the                         procedure well. The quality of the bowel preparation                         was good. Findings:      The perianal and digital rectal examinations were normal.      The terminal ileum appeared normal.      A 2 mm polyp was found in the recto-sigmoid colon. The polyp was       sessile. The polyp was removed with a jumbo cold forceps. Resection and       retrieval were complete. Estimated blood loss was  minimal.      The exam was otherwise without abnormality on direct and retroflexion       views. Impression:            - The examined portion of the ileum was normal.                        - One 2 mm polyp at the recto-sigmoid colon, removed                         with a jumbo cold forceps. Resected and retrieved.                        - The examination was otherwise normal on direct and                         retroflexion views. Recommendation:        - Discharge patient to home.                        - Resume previous diet.                        - Continue present medications.                        - Await pathology results.                        - Repeat colonoscopy is not recommended due to current                         age (36 years or older) for surveillance.                        - Return to referring physician as previously                         scheduled. Procedure Code(s):     --- Professional ---  45380, Colonoscopy, flexible; with biopsy, single or                         multiple Diagnosis Code(s):     --- Professional ---                        K63.5, Polyp of colon                        R10.84, Generalized abdominal pain CPT copyright 2019 American Medical Association. All rights reserved. The codes documented in this report are preliminary and upon coder review may  be revised to meet current compliance requirements. Andrey Farmer MD, MD 11/22/2020 1:22:16 PM Number of Addenda: 0 Note Initiated On: 11/22/2020 11:47 AM Scope Withdrawal Time: 0 hours 6 minutes 49 seconds  Total Procedure Duration: 0 hours 23 minutes 52 seconds  Estimated Blood Loss:  Estimated blood loss was minimal.      [facility]

## 2020-11-22 NOTE — Anesthesia Preprocedure Evaluation (Signed)
Anesthesia Evaluation  Patient identified by MRN, date of birth, ID band Patient awake    Reviewed: Allergy & Precautions, NPO status , Patient's Chart, lab work & pertinent test results  Airway Mallampati: I  TM Distance: >3 FB Neck ROM: full    Dental no notable dental hx.    Pulmonary neg pulmonary ROS,    Pulmonary exam normal        Cardiovascular hypertension, On Medications Supra Ventricular Tachycardia      Neuro/Psych negative neurological ROS  negative psych ROS   GI/Hepatic Neg liver ROS, Bowel prep,GERD  ,  Endo/Other  negative endocrine ROS  Renal/GU negative Renal ROS  negative genitourinary   Musculoskeletal   Abdominal   Peds  Hematology negative hematology ROS (+)   Anesthesia Other Findings Past Medical History: No date: Acoustic neuroma (HCC)     Comment:  right ear No date: Anemia No date: Arthritis No date: B12 deficiency No date: Basal cell carcinoma, eyelid No date: Chronic fatigue syndrome No date: Depression 0ne episode of tachycardia on 10/03/14: Dysrhythmia No date: GERD (gastroesophageal reflux disease) No date: Headache bruit: Heart murmur No date: Hypertension 01/05/2015: Hyperthyroidism No date: Osteoporosis No date: Palpitations 09/03/2015: Postablative hypothyroidism No date: SVT (supraventricular tachycardia) (HCC) No date: Thyroid nodule No date: Vitamin D deficiency  Past Surgical History: 1990: BREAST REDUCTION SURGERY cataract removal: EYE SURGERY; Bilateral No date: GASTRIC BYPASS 07/2009: LAPAROSCOPIC HYSTERECTOMY 20+ yrs ago: REDUCTION MAMMAPLASTY; Bilateral 2013: TOTAL KNEE ARTHROPLASTY     Comment:  Dr. Marry Guan, left 06/2013: TOTAL KNEE ARTHROPLASTY; Right     Reproductive/Obstetrics negative OB ROS                             Anesthesia Physical Anesthesia Plan  ASA: 2  Anesthesia Plan: General   Post-op Pain Management:     Induction:   PONV Risk Score and Plan: 2 and Propofol infusion and TIVA  Airway Management Planned:   Additional Equipment:   Intra-op Plan:   Post-operative Plan:   Informed Consent: I have reviewed the patients History and Physical, chart, labs and discussed the procedure including the risks, benefits and alternatives for the proposed anesthesia with the patient or authorized representative who has indicated his/her understanding and acceptance.     Dental Advisory Given  Plan Discussed with: Anesthesiologist, CRNA and Surgeon  Anesthesia Plan Comments:         Anesthesia Quick Evaluation

## 2020-11-23 ENCOUNTER — Encounter: Payer: Self-pay | Admitting: Gastroenterology

## 2020-11-23 LAB — SURGICAL PATHOLOGY

## 2020-11-29 DIAGNOSIS — M9903 Segmental and somatic dysfunction of lumbar region: Secondary | ICD-10-CM | POA: Diagnosis not present

## 2020-11-29 DIAGNOSIS — M5441 Lumbago with sciatica, right side: Secondary | ICD-10-CM | POA: Diagnosis not present

## 2020-12-10 DIAGNOSIS — E890 Postprocedural hypothyroidism: Secondary | ICD-10-CM | POA: Diagnosis not present

## 2020-12-10 DIAGNOSIS — I10 Essential (primary) hypertension: Secondary | ICD-10-CM | POA: Diagnosis not present

## 2020-12-10 DIAGNOSIS — E782 Mixed hyperlipidemia: Secondary | ICD-10-CM | POA: Diagnosis not present

## 2020-12-10 DIAGNOSIS — Z79899 Other long term (current) drug therapy: Secondary | ICD-10-CM | POA: Diagnosis not present

## 2020-12-16 ENCOUNTER — Other Ambulatory Visit: Payer: Self-pay | Admitting: Internal Medicine

## 2020-12-16 DIAGNOSIS — I471 Supraventricular tachycardia: Secondary | ICD-10-CM | POA: Diagnosis not present

## 2020-12-16 DIAGNOSIS — I10 Essential (primary) hypertension: Secondary | ICD-10-CM | POA: Diagnosis not present

## 2020-12-16 DIAGNOSIS — F320 Major depressive disorder, single episode, mild: Secondary | ICD-10-CM | POA: Diagnosis not present

## 2020-12-16 DIAGNOSIS — Z1231 Encounter for screening mammogram for malignant neoplasm of breast: Secondary | ICD-10-CM

## 2020-12-16 DIAGNOSIS — E782 Mixed hyperlipidemia: Secondary | ICD-10-CM | POA: Diagnosis not present

## 2020-12-16 DIAGNOSIS — R400 Somnolence: Secondary | ICD-10-CM | POA: Diagnosis not present

## 2020-12-16 DIAGNOSIS — E559 Vitamin D deficiency, unspecified: Secondary | ICD-10-CM | POA: Diagnosis not present

## 2020-12-16 DIAGNOSIS — E890 Postprocedural hypothyroidism: Secondary | ICD-10-CM | POA: Diagnosis not present

## 2020-12-17 DIAGNOSIS — M5136 Other intervertebral disc degeneration, lumbar region: Secondary | ICD-10-CM | POA: Diagnosis not present

## 2020-12-17 DIAGNOSIS — M47816 Spondylosis without myelopathy or radiculopathy, lumbar region: Secondary | ICD-10-CM | POA: Diagnosis not present

## 2020-12-17 DIAGNOSIS — M461 Sacroiliitis, not elsewhere classified: Secondary | ICD-10-CM | POA: Diagnosis not present

## 2020-12-17 DIAGNOSIS — Z79899 Other long term (current) drug therapy: Secondary | ICD-10-CM | POA: Diagnosis not present

## 2021-01-27 DIAGNOSIS — F5105 Insomnia due to other mental disorder: Secondary | ICD-10-CM | POA: Diagnosis not present

## 2021-01-27 DIAGNOSIS — F331 Major depressive disorder, recurrent, moderate: Secondary | ICD-10-CM | POA: Diagnosis not present

## 2021-01-31 DIAGNOSIS — F331 Major depressive disorder, recurrent, moderate: Secondary | ICD-10-CM | POA: Diagnosis not present

## 2021-01-31 DIAGNOSIS — F5105 Insomnia due to other mental disorder: Secondary | ICD-10-CM | POA: Diagnosis not present

## 2021-02-08 DIAGNOSIS — M47816 Spondylosis without myelopathy or radiculopathy, lumbar region: Secondary | ICD-10-CM | POA: Diagnosis not present

## 2021-02-16 DIAGNOSIS — L2389 Allergic contact dermatitis due to other agents: Secondary | ICD-10-CM | POA: Diagnosis not present

## 2021-02-16 DIAGNOSIS — L720 Epidermal cyst: Secondary | ICD-10-CM | POA: Diagnosis not present

## 2021-02-16 DIAGNOSIS — L570 Actinic keratosis: Secondary | ICD-10-CM | POA: Diagnosis not present

## 2021-02-24 DIAGNOSIS — M47816 Spondylosis without myelopathy or radiculopathy, lumbar region: Secondary | ICD-10-CM | POA: Diagnosis not present

## 2021-03-09 DIAGNOSIS — M47816 Spondylosis without myelopathy or radiculopathy, lumbar region: Secondary | ICD-10-CM | POA: Diagnosis not present

## 2021-03-21 DIAGNOSIS — G4710 Hypersomnia, unspecified: Secondary | ICD-10-CM | POA: Diagnosis not present

## 2021-03-21 DIAGNOSIS — I10 Essential (primary) hypertension: Secondary | ICD-10-CM | POA: Diagnosis not present

## 2021-03-21 DIAGNOSIS — R400 Somnolence: Secondary | ICD-10-CM | POA: Diagnosis not present

## 2021-03-25 DIAGNOSIS — G4733 Obstructive sleep apnea (adult) (pediatric): Secondary | ICD-10-CM | POA: Diagnosis not present

## 2021-04-04 DIAGNOSIS — I10 Essential (primary) hypertension: Secondary | ICD-10-CM | POA: Diagnosis not present

## 2021-04-04 DIAGNOSIS — Z79899 Other long term (current) drug therapy: Secondary | ICD-10-CM | POA: Diagnosis not present

## 2021-04-04 DIAGNOSIS — E782 Mixed hyperlipidemia: Secondary | ICD-10-CM | POA: Diagnosis not present

## 2021-04-04 DIAGNOSIS — R5382 Chronic fatigue, unspecified: Secondary | ICD-10-CM | POA: Diagnosis not present

## 2021-04-04 DIAGNOSIS — E559 Vitamin D deficiency, unspecified: Secondary | ICD-10-CM | POA: Diagnosis not present

## 2021-04-04 DIAGNOSIS — E890 Postprocedural hypothyroidism: Secondary | ICD-10-CM | POA: Diagnosis not present

## 2021-04-11 DIAGNOSIS — G4733 Obstructive sleep apnea (adult) (pediatric): Secondary | ICD-10-CM | POA: Diagnosis not present

## 2021-04-11 DIAGNOSIS — I471 Supraventricular tachycardia: Secondary | ICD-10-CM | POA: Diagnosis not present

## 2021-04-11 DIAGNOSIS — E782 Mixed hyperlipidemia: Secondary | ICD-10-CM | POA: Diagnosis not present

## 2021-04-11 DIAGNOSIS — I10 Essential (primary) hypertension: Secondary | ICD-10-CM | POA: Diagnosis not present

## 2021-04-12 ENCOUNTER — Other Ambulatory Visit: Payer: Self-pay | Admitting: Family Medicine

## 2021-04-12 DIAGNOSIS — Z79899 Other long term (current) drug therapy: Secondary | ICD-10-CM | POA: Diagnosis not present

## 2021-04-12 DIAGNOSIS — M5442 Lumbago with sciatica, left side: Secondary | ICD-10-CM | POA: Diagnosis not present

## 2021-04-12 DIAGNOSIS — M5416 Radiculopathy, lumbar region: Secondary | ICD-10-CM

## 2021-04-12 DIAGNOSIS — G8929 Other chronic pain: Secondary | ICD-10-CM | POA: Diagnosis not present

## 2021-04-12 DIAGNOSIS — M5441 Lumbago with sciatica, right side: Secondary | ICD-10-CM | POA: Diagnosis not present

## 2021-04-12 DIAGNOSIS — M461 Sacroiliitis, not elsewhere classified: Secondary | ICD-10-CM | POA: Diagnosis not present

## 2021-04-12 DIAGNOSIS — M5136 Other intervertebral disc degeneration, lumbar region: Secondary | ICD-10-CM | POA: Diagnosis not present

## 2021-04-19 DIAGNOSIS — Z961 Presence of intraocular lens: Secondary | ICD-10-CM | POA: Diagnosis not present

## 2021-04-21 ENCOUNTER — Other Ambulatory Visit: Payer: Self-pay

## 2021-04-21 ENCOUNTER — Ambulatory Visit
Admission: RE | Admit: 2021-04-21 | Discharge: 2021-04-21 | Disposition: A | Discharge status: Home / Self Care | Payer: PPO | Source: Ambulatory Visit | Attending: Family Medicine | Admitting: Family Medicine

## 2021-04-21 DIAGNOSIS — M5416 Radiculopathy, lumbar region: Secondary | ICD-10-CM | POA: Diagnosis not present

## 2021-04-21 DIAGNOSIS — M545 Low back pain, unspecified: Secondary | ICD-10-CM | POA: Diagnosis not present

## 2021-04-25 DIAGNOSIS — M461 Sacroiliitis, not elsewhere classified: Secondary | ICD-10-CM | POA: Diagnosis not present

## 2021-04-25 DIAGNOSIS — Z79899 Other long term (current) drug therapy: Secondary | ICD-10-CM | POA: Diagnosis not present

## 2021-04-25 DIAGNOSIS — M5416 Radiculopathy, lumbar region: Secondary | ICD-10-CM | POA: Diagnosis not present

## 2021-04-25 DIAGNOSIS — M5136 Other intervertebral disc degeneration, lumbar region: Secondary | ICD-10-CM | POA: Diagnosis not present

## 2021-05-04 DIAGNOSIS — F331 Major depressive disorder, recurrent, moderate: Secondary | ICD-10-CM | POA: Diagnosis not present

## 2021-05-04 DIAGNOSIS — F5105 Insomnia due to other mental disorder: Secondary | ICD-10-CM | POA: Diagnosis not present

## 2021-05-10 DIAGNOSIS — G4733 Obstructive sleep apnea (adult) (pediatric): Secondary | ICD-10-CM | POA: Diagnosis not present

## 2021-05-17 DIAGNOSIS — M48062 Spinal stenosis, lumbar region with neurogenic claudication: Secondary | ICD-10-CM | POA: Diagnosis not present

## 2021-05-17 DIAGNOSIS — M5416 Radiculopathy, lumbar region: Secondary | ICD-10-CM | POA: Diagnosis not present

## 2021-06-10 DIAGNOSIS — G4733 Obstructive sleep apnea (adult) (pediatric): Secondary | ICD-10-CM | POA: Diagnosis not present

## 2021-06-15 DIAGNOSIS — M48062 Spinal stenosis, lumbar region with neurogenic claudication: Secondary | ICD-10-CM | POA: Diagnosis not present

## 2021-06-15 DIAGNOSIS — M5416 Radiculopathy, lumbar region: Secondary | ICD-10-CM | POA: Diagnosis not present

## 2021-06-15 DIAGNOSIS — M5136 Other intervertebral disc degeneration, lumbar region: Secondary | ICD-10-CM | POA: Diagnosis not present

## 2021-06-17 DIAGNOSIS — S93324S Dislocation of tarsometatarsal joint of right foot, sequela: Secondary | ICD-10-CM | POA: Diagnosis not present

## 2021-06-17 DIAGNOSIS — M25872 Other specified joint disorders, left ankle and foot: Secondary | ICD-10-CM | POA: Diagnosis not present

## 2021-06-17 DIAGNOSIS — M19071 Primary osteoarthritis, right ankle and foot: Secondary | ICD-10-CM | POA: Diagnosis not present

## 2021-06-29 ENCOUNTER — Ambulatory Visit
Admission: RE | Admit: 2021-06-29 | Discharge: 2021-06-29 | Disposition: A | Discharge status: Home / Self Care | Payer: PPO | Source: Ambulatory Visit | Attending: Internal Medicine | Admitting: Internal Medicine

## 2021-06-29 ENCOUNTER — Other Ambulatory Visit: Payer: Self-pay

## 2021-06-29 DIAGNOSIS — Z1231 Encounter for screening mammogram for malignant neoplasm of breast: Secondary | ICD-10-CM | POA: Insufficient documentation

## 2021-06-30 DIAGNOSIS — D2262 Melanocytic nevi of left upper limb, including shoulder: Secondary | ICD-10-CM | POA: Diagnosis not present

## 2021-06-30 DIAGNOSIS — D2272 Melanocytic nevi of left lower limb, including hip: Secondary | ICD-10-CM | POA: Diagnosis not present

## 2021-06-30 DIAGNOSIS — L821 Other seborrheic keratosis: Secondary | ICD-10-CM | POA: Diagnosis not present

## 2021-06-30 DIAGNOSIS — X32XXXA Exposure to sunlight, initial encounter: Secondary | ICD-10-CM | POA: Diagnosis not present

## 2021-06-30 DIAGNOSIS — D485 Neoplasm of uncertain behavior of skin: Secondary | ICD-10-CM | POA: Diagnosis not present

## 2021-06-30 DIAGNOSIS — D0439 Carcinoma in situ of skin of other parts of face: Secondary | ICD-10-CM | POA: Diagnosis not present

## 2021-06-30 DIAGNOSIS — D2271 Melanocytic nevi of right lower limb, including hip: Secondary | ICD-10-CM | POA: Diagnosis not present

## 2021-06-30 DIAGNOSIS — D225 Melanocytic nevi of trunk: Secondary | ICD-10-CM | POA: Diagnosis not present

## 2021-06-30 DIAGNOSIS — R208 Other disturbances of skin sensation: Secondary | ICD-10-CM | POA: Diagnosis not present

## 2021-06-30 DIAGNOSIS — D0462 Carcinoma in situ of skin of left upper limb, including shoulder: Secondary | ICD-10-CM | POA: Diagnosis not present

## 2021-06-30 DIAGNOSIS — L820 Inflamed seborrheic keratosis: Secondary | ICD-10-CM | POA: Diagnosis not present

## 2021-06-30 DIAGNOSIS — D2261 Melanocytic nevi of right upper limb, including shoulder: Secondary | ICD-10-CM | POA: Diagnosis not present

## 2021-06-30 DIAGNOSIS — D0472 Carcinoma in situ of skin of left lower limb, including hip: Secondary | ICD-10-CM | POA: Diagnosis not present

## 2021-06-30 DIAGNOSIS — L570 Actinic keratosis: Secondary | ICD-10-CM | POA: Diagnosis not present

## 2021-07-05 DIAGNOSIS — E890 Postprocedural hypothyroidism: Secondary | ICD-10-CM | POA: Diagnosis not present

## 2021-07-05 DIAGNOSIS — E782 Mixed hyperlipidemia: Secondary | ICD-10-CM | POA: Diagnosis not present

## 2021-07-05 DIAGNOSIS — I10 Essential (primary) hypertension: Secondary | ICD-10-CM | POA: Diagnosis not present

## 2021-07-05 DIAGNOSIS — Z79899 Other long term (current) drug therapy: Secondary | ICD-10-CM | POA: Diagnosis not present

## 2021-07-05 DIAGNOSIS — M461 Sacroiliitis, not elsewhere classified: Secondary | ICD-10-CM | POA: Diagnosis not present

## 2021-07-05 DIAGNOSIS — E559 Vitamin D deficiency, unspecified: Secondary | ICD-10-CM | POA: Diagnosis not present

## 2021-07-05 DIAGNOSIS — R5382 Chronic fatigue, unspecified: Secondary | ICD-10-CM | POA: Diagnosis not present

## 2021-07-05 DIAGNOSIS — Z0000 Encounter for general adult medical examination without abnormal findings: Secondary | ICD-10-CM | POA: Diagnosis not present

## 2021-07-05 DIAGNOSIS — F321 Major depressive disorder, single episode, moderate: Secondary | ICD-10-CM | POA: Diagnosis not present

## 2021-07-08 DIAGNOSIS — G4733 Obstructive sleep apnea (adult) (pediatric): Secondary | ICD-10-CM | POA: Diagnosis not present

## 2021-07-21 DIAGNOSIS — D0472 Carcinoma in situ of skin of left lower limb, including hip: Secondary | ICD-10-CM | POA: Diagnosis not present

## 2021-07-21 DIAGNOSIS — D044 Carcinoma in situ of skin of scalp and neck: Secondary | ICD-10-CM | POA: Diagnosis not present

## 2021-07-21 DIAGNOSIS — D0462 Carcinoma in situ of skin of left upper limb, including shoulder: Secondary | ICD-10-CM | POA: Diagnosis not present

## 2021-07-22 DIAGNOSIS — E890 Postprocedural hypothyroidism: Secondary | ICD-10-CM | POA: Diagnosis not present

## 2021-07-22 DIAGNOSIS — Z79899 Other long term (current) drug therapy: Secondary | ICD-10-CM | POA: Diagnosis not present

## 2021-07-22 DIAGNOSIS — E782 Mixed hyperlipidemia: Secondary | ICD-10-CM | POA: Diagnosis not present

## 2021-07-25 DIAGNOSIS — F5105 Insomnia due to other mental disorder: Secondary | ICD-10-CM | POA: Diagnosis not present

## 2021-07-25 DIAGNOSIS — F331 Major depressive disorder, recurrent, moderate: Secondary | ICD-10-CM | POA: Diagnosis not present

## 2021-07-29 DIAGNOSIS — Z48817 Encounter for surgical aftercare following surgery on the skin and subcutaneous tissue: Secondary | ICD-10-CM | POA: Diagnosis not present

## 2021-08-10 DIAGNOSIS — G4733 Obstructive sleep apnea (adult) (pediatric): Secondary | ICD-10-CM | POA: Diagnosis not present

## 2021-08-11 DIAGNOSIS — R399 Unspecified symptoms and signs involving the genitourinary system: Secondary | ICD-10-CM | POA: Diagnosis not present

## 2021-08-22 DIAGNOSIS — Z9884 Bariatric surgery status: Secondary | ICD-10-CM | POA: Diagnosis not present

## 2021-08-22 DIAGNOSIS — K219 Gastro-esophageal reflux disease without esophagitis: Secondary | ICD-10-CM | POA: Diagnosis not present

## 2021-08-22 DIAGNOSIS — K449 Diaphragmatic hernia without obstruction or gangrene: Secondary | ICD-10-CM | POA: Diagnosis not present

## 2021-08-22 DIAGNOSIS — Z6829 Body mass index (BMI) 29.0-29.9, adult: Secondary | ICD-10-CM | POA: Diagnosis not present

## 2021-08-22 DIAGNOSIS — I10 Essential (primary) hypertension: Secondary | ICD-10-CM | POA: Diagnosis not present

## 2021-08-22 DIAGNOSIS — J9859 Other diseases of mediastinum, not elsewhere classified: Secondary | ICD-10-CM | POA: Diagnosis not present

## 2021-08-22 DIAGNOSIS — R635 Abnormal weight gain: Secondary | ICD-10-CM | POA: Diagnosis not present

## 2021-08-25 DIAGNOSIS — G4733 Obstructive sleep apnea (adult) (pediatric): Secondary | ICD-10-CM | POA: Diagnosis not present

## 2021-09-07 DIAGNOSIS — M48062 Spinal stenosis, lumbar region with neurogenic claudication: Secondary | ICD-10-CM | POA: Diagnosis not present

## 2021-09-07 DIAGNOSIS — M5136 Other intervertebral disc degeneration, lumbar region: Secondary | ICD-10-CM | POA: Diagnosis not present

## 2021-09-07 DIAGNOSIS — M5416 Radiculopathy, lumbar region: Secondary | ICD-10-CM | POA: Diagnosis not present

## 2021-09-20 DIAGNOSIS — G4733 Obstructive sleep apnea (adult) (pediatric): Secondary | ICD-10-CM | POA: Diagnosis not present

## 2021-09-22 DIAGNOSIS — D0439 Carcinoma in situ of skin of other parts of face: Secondary | ICD-10-CM | POA: Diagnosis not present

## 2021-09-22 DIAGNOSIS — L570 Actinic keratosis: Secondary | ICD-10-CM | POA: Diagnosis not present

## 2021-09-29 DIAGNOSIS — M48062 Spinal stenosis, lumbar region with neurogenic claudication: Secondary | ICD-10-CM | POA: Diagnosis not present

## 2021-09-29 DIAGNOSIS — M5416 Radiculopathy, lumbar region: Secondary | ICD-10-CM | POA: Diagnosis not present

## 2021-10-06 DIAGNOSIS — E782 Mixed hyperlipidemia: Secondary | ICD-10-CM | POA: Diagnosis not present

## 2021-10-06 DIAGNOSIS — R270 Ataxia, unspecified: Secondary | ICD-10-CM | POA: Diagnosis not present

## 2021-10-06 DIAGNOSIS — Z79899 Other long term (current) drug therapy: Secondary | ICD-10-CM | POA: Diagnosis not present

## 2021-10-06 DIAGNOSIS — I10 Essential (primary) hypertension: Secondary | ICD-10-CM | POA: Diagnosis not present

## 2021-10-06 DIAGNOSIS — R5382 Chronic fatigue, unspecified: Secondary | ICD-10-CM | POA: Diagnosis not present

## 2021-10-06 DIAGNOSIS — E559 Vitamin D deficiency, unspecified: Secondary | ICD-10-CM | POA: Diagnosis not present

## 2021-10-06 DIAGNOSIS — E890 Postprocedural hypothyroidism: Secondary | ICD-10-CM | POA: Diagnosis not present

## 2021-10-21 DIAGNOSIS — G4733 Obstructive sleep apnea (adult) (pediatric): Secondary | ICD-10-CM | POA: Diagnosis not present

## 2021-10-31 DIAGNOSIS — F331 Major depressive disorder, recurrent, moderate: Secondary | ICD-10-CM | POA: Diagnosis not present

## 2021-10-31 DIAGNOSIS — F5105 Insomnia due to other mental disorder: Secondary | ICD-10-CM | POA: Diagnosis not present

## 2021-11-01 DIAGNOSIS — T8484XA Pain due to internal orthopedic prosthetic devices, implants and grafts, initial encounter: Secondary | ICD-10-CM | POA: Diagnosis not present

## 2021-11-01 DIAGNOSIS — Z96651 Presence of right artificial knee joint: Secondary | ICD-10-CM | POA: Diagnosis not present

## 2021-11-16 DIAGNOSIS — F331 Major depressive disorder, recurrent, moderate: Secondary | ICD-10-CM | POA: Diagnosis not present

## 2021-11-16 DIAGNOSIS — F5105 Insomnia due to other mental disorder: Secondary | ICD-10-CM | POA: Diagnosis not present

## 2021-11-20 DIAGNOSIS — G4733 Obstructive sleep apnea (adult) (pediatric): Secondary | ICD-10-CM | POA: Diagnosis not present

## 2021-12-07 DIAGNOSIS — M5416 Radiculopathy, lumbar region: Secondary | ICD-10-CM | POA: Diagnosis not present

## 2021-12-07 DIAGNOSIS — Z79899 Other long term (current) drug therapy: Secondary | ICD-10-CM | POA: Diagnosis not present

## 2021-12-07 DIAGNOSIS — M5136 Other intervertebral disc degeneration, lumbar region: Secondary | ICD-10-CM | POA: Diagnosis not present

## 2021-12-07 DIAGNOSIS — M7918 Myalgia, other site: Secondary | ICD-10-CM | POA: Diagnosis not present

## 2021-12-08 DIAGNOSIS — F5105 Insomnia due to other mental disorder: Secondary | ICD-10-CM | POA: Diagnosis not present

## 2021-12-08 DIAGNOSIS — F331 Major depressive disorder, recurrent, moderate: Secondary | ICD-10-CM | POA: Diagnosis not present

## 2021-12-14 ENCOUNTER — Encounter (INDEPENDENT_AMBULATORY_CARE_PROVIDER_SITE_OTHER): Payer: Self-pay

## 2021-12-21 DIAGNOSIS — G4733 Obstructive sleep apnea (adult) (pediatric): Secondary | ICD-10-CM | POA: Diagnosis not present

## 2021-12-22 DIAGNOSIS — Z85828 Personal history of other malignant neoplasm of skin: Secondary | ICD-10-CM | POA: Diagnosis not present

## 2021-12-22 DIAGNOSIS — X32XXXA Exposure to sunlight, initial encounter: Secondary | ICD-10-CM | POA: Diagnosis not present

## 2021-12-22 DIAGNOSIS — D485 Neoplasm of uncertain behavior of skin: Secondary | ICD-10-CM | POA: Diagnosis not present

## 2021-12-22 DIAGNOSIS — L570 Actinic keratosis: Secondary | ICD-10-CM | POA: Diagnosis not present

## 2021-12-22 DIAGNOSIS — D045 Carcinoma in situ of skin of trunk: Secondary | ICD-10-CM | POA: Diagnosis not present

## 2021-12-22 DIAGNOSIS — D2261 Melanocytic nevi of right upper limb, including shoulder: Secondary | ICD-10-CM | POA: Diagnosis not present

## 2021-12-22 DIAGNOSIS — D2272 Melanocytic nevi of left lower limb, including hip: Secondary | ICD-10-CM | POA: Diagnosis not present

## 2021-12-22 DIAGNOSIS — D225 Melanocytic nevi of trunk: Secondary | ICD-10-CM | POA: Diagnosis not present

## 2021-12-30 DIAGNOSIS — E782 Mixed hyperlipidemia: Secondary | ICD-10-CM | POA: Diagnosis not present

## 2021-12-30 DIAGNOSIS — Z79899 Other long term (current) drug therapy: Secondary | ICD-10-CM | POA: Diagnosis not present

## 2021-12-30 DIAGNOSIS — E890 Postprocedural hypothyroidism: Secondary | ICD-10-CM | POA: Diagnosis not present

## 2022-01-02 DIAGNOSIS — F331 Major depressive disorder, recurrent, moderate: Secondary | ICD-10-CM | POA: Diagnosis not present

## 2022-01-02 DIAGNOSIS — F5105 Insomnia due to other mental disorder: Secondary | ICD-10-CM | POA: Diagnosis not present

## 2022-01-17 DIAGNOSIS — I10 Essential (primary) hypertension: Secondary | ICD-10-CM | POA: Diagnosis not present

## 2022-01-21 DIAGNOSIS — G4733 Obstructive sleep apnea (adult) (pediatric): Secondary | ICD-10-CM | POA: Diagnosis not present

## 2022-01-25 DIAGNOSIS — E559 Vitamin D deficiency, unspecified: Secondary | ICD-10-CM | POA: Diagnosis not present

## 2022-01-25 DIAGNOSIS — E890 Postprocedural hypothyroidism: Secondary | ICD-10-CM | POA: Diagnosis not present

## 2022-01-25 DIAGNOSIS — I10 Essential (primary) hypertension: Secondary | ICD-10-CM | POA: Diagnosis not present

## 2022-01-25 DIAGNOSIS — R5382 Chronic fatigue, unspecified: Secondary | ICD-10-CM | POA: Diagnosis not present

## 2022-01-25 DIAGNOSIS — Z23 Encounter for immunization: Secondary | ICD-10-CM | POA: Diagnosis not present

## 2022-01-25 DIAGNOSIS — E782 Mixed hyperlipidemia: Secondary | ICD-10-CM | POA: Diagnosis not present

## 2022-01-25 DIAGNOSIS — F321 Major depressive disorder, single episode, moderate: Secondary | ICD-10-CM | POA: Diagnosis not present

## 2022-01-26 DIAGNOSIS — R2689 Other abnormalities of gait and mobility: Secondary | ICD-10-CM | POA: Diagnosis not present

## 2022-01-26 DIAGNOSIS — G939 Disorder of brain, unspecified: Secondary | ICD-10-CM | POA: Diagnosis not present

## 2022-01-26 DIAGNOSIS — G629 Polyneuropathy, unspecified: Secondary | ICD-10-CM | POA: Diagnosis not present

## 2022-01-26 DIAGNOSIS — D333 Benign neoplasm of cranial nerves: Secondary | ICD-10-CM | POA: Diagnosis not present

## 2022-01-31 DIAGNOSIS — F331 Major depressive disorder, recurrent, moderate: Secondary | ICD-10-CM | POA: Diagnosis not present

## 2022-01-31 DIAGNOSIS — F5105 Insomnia due to other mental disorder: Secondary | ICD-10-CM | POA: Diagnosis not present

## 2022-02-06 ENCOUNTER — Other Ambulatory Visit: Payer: Self-pay | Admitting: Neurology

## 2022-02-06 DIAGNOSIS — D333 Benign neoplasm of cranial nerves: Secondary | ICD-10-CM

## 2022-02-07 DIAGNOSIS — R635 Abnormal weight gain: Secondary | ICD-10-CM | POA: Diagnosis not present

## 2022-02-07 DIAGNOSIS — Z9884 Bariatric surgery status: Secondary | ICD-10-CM | POA: Diagnosis not present

## 2022-02-07 DIAGNOSIS — M5000 Cervical disc disorder with myelopathy, unspecified cervical region: Secondary | ICD-10-CM | POA: Diagnosis not present

## 2022-02-07 DIAGNOSIS — M4600 Spinal enthesopathy, site unspecified: Secondary | ICD-10-CM | POA: Diagnosis not present

## 2022-02-13 ENCOUNTER — Ambulatory Visit
Admission: RE | Admit: 2022-02-13 | Discharge: 2022-02-13 | Disposition: A | Discharge status: Home / Self Care | Payer: PPO | Source: Ambulatory Visit | Attending: Neurology | Admitting: Neurology

## 2022-02-13 DIAGNOSIS — R42 Dizziness and giddiness: Secondary | ICD-10-CM | POA: Diagnosis not present

## 2022-02-13 DIAGNOSIS — D333 Benign neoplasm of cranial nerves: Secondary | ICD-10-CM | POA: Insufficient documentation

## 2022-02-13 MED ORDER — GADOBUTROL 1 MMOL/ML IV SOLN
8.0000 mL | Freq: Once | INTRAVENOUS | Status: AC | PRN
  Administered 2022-02-13: 8 mL via INTRAVENOUS

## 2022-02-20 DIAGNOSIS — G4733 Obstructive sleep apnea (adult) (pediatric): Secondary | ICD-10-CM | POA: Diagnosis not present

## 2022-02-27 DIAGNOSIS — F331 Major depressive disorder, recurrent, moderate: Secondary | ICD-10-CM | POA: Diagnosis not present

## 2022-02-27 DIAGNOSIS — F5105 Insomnia due to other mental disorder: Secondary | ICD-10-CM | POA: Diagnosis not present

## 2022-02-28 DIAGNOSIS — M5416 Radiculopathy, lumbar region: Secondary | ICD-10-CM | POA: Diagnosis not present

## 2022-02-28 DIAGNOSIS — M48062 Spinal stenosis, lumbar region with neurogenic claudication: Secondary | ICD-10-CM | POA: Diagnosis not present

## 2022-03-10 DIAGNOSIS — M48062 Spinal stenosis, lumbar region with neurogenic claudication: Secondary | ICD-10-CM | POA: Diagnosis not present

## 2022-03-10 DIAGNOSIS — M7918 Myalgia, other site: Secondary | ICD-10-CM | POA: Diagnosis not present

## 2022-03-10 DIAGNOSIS — M5136 Other intervertebral disc degeneration, lumbar region: Secondary | ICD-10-CM | POA: Diagnosis not present

## 2022-03-10 DIAGNOSIS — Z79899 Other long term (current) drug therapy: Secondary | ICD-10-CM | POA: Diagnosis not present

## 2022-03-10 DIAGNOSIS — M5416 Radiculopathy, lumbar region: Secondary | ICD-10-CM | POA: Diagnosis not present

## 2022-03-13 DIAGNOSIS — R2681 Unsteadiness on feet: Secondary | ICD-10-CM | POA: Diagnosis not present

## 2022-03-13 DIAGNOSIS — M5451 Vertebrogenic low back pain: Secondary | ICD-10-CM | POA: Diagnosis not present

## 2022-03-14 DIAGNOSIS — Z79899 Other long term (current) drug therapy: Secondary | ICD-10-CM | POA: Diagnosis not present

## 2022-03-14 DIAGNOSIS — F331 Major depressive disorder, recurrent, moderate: Secondary | ICD-10-CM | POA: Diagnosis not present

## 2022-03-14 DIAGNOSIS — F5105 Insomnia due to other mental disorder: Secondary | ICD-10-CM | POA: Diagnosis not present

## 2022-03-16 DIAGNOSIS — M5451 Vertebrogenic low back pain: Secondary | ICD-10-CM | POA: Diagnosis not present

## 2022-03-16 DIAGNOSIS — R2681 Unsteadiness on feet: Secondary | ICD-10-CM | POA: Diagnosis not present

## 2022-03-21 DIAGNOSIS — I10 Essential (primary) hypertension: Secondary | ICD-10-CM | POA: Diagnosis not present

## 2022-03-21 DIAGNOSIS — R0781 Pleurodynia: Secondary | ICD-10-CM | POA: Diagnosis not present

## 2022-03-21 DIAGNOSIS — W010XXA Fall on same level from slipping, tripping and stumbling without subsequent striking against object, initial encounter: Secondary | ICD-10-CM | POA: Diagnosis not present

## 2022-03-21 DIAGNOSIS — Y92009 Unspecified place in unspecified non-institutional (private) residence as the place of occurrence of the external cause: Secondary | ICD-10-CM | POA: Diagnosis not present

## 2022-03-23 ENCOUNTER — Other Ambulatory Visit: Payer: Self-pay | Admitting: Physician Assistant

## 2022-03-23 DIAGNOSIS — G4733 Obstructive sleep apnea (adult) (pediatric): Secondary | ICD-10-CM | POA: Diagnosis not present

## 2022-03-23 DIAGNOSIS — Z8781 Personal history of (healed) traumatic fracture: Secondary | ICD-10-CM

## 2022-03-23 DIAGNOSIS — R918 Other nonspecific abnormal finding of lung field: Secondary | ICD-10-CM

## 2022-04-10 DIAGNOSIS — I471 Supraventricular tachycardia, unspecified: Secondary | ICD-10-CM | POA: Diagnosis not present

## 2022-04-10 DIAGNOSIS — I10 Essential (primary) hypertension: Secondary | ICD-10-CM | POA: Diagnosis not present

## 2022-04-10 DIAGNOSIS — E782 Mixed hyperlipidemia: Secondary | ICD-10-CM | POA: Diagnosis not present

## 2022-04-10 DIAGNOSIS — G4733 Obstructive sleep apnea (adult) (pediatric): Secondary | ICD-10-CM | POA: Diagnosis not present

## 2022-04-17 DIAGNOSIS — F331 Major depressive disorder, recurrent, moderate: Secondary | ICD-10-CM | POA: Diagnosis not present

## 2022-04-17 DIAGNOSIS — F5105 Insomnia due to other mental disorder: Secondary | ICD-10-CM | POA: Diagnosis not present

## 2022-04-22 DIAGNOSIS — G4733 Obstructive sleep apnea (adult) (pediatric): Secondary | ICD-10-CM | POA: Diagnosis not present

## 2022-05-03 DIAGNOSIS — M8588 Other specified disorders of bone density and structure, other site: Secondary | ICD-10-CM | POA: Diagnosis not present

## 2022-05-03 DIAGNOSIS — M47816 Spondylosis without myelopathy or radiculopathy, lumbar region: Secondary | ICD-10-CM | POA: Diagnosis not present

## 2022-05-03 DIAGNOSIS — M5416 Radiculopathy, lumbar region: Secondary | ICD-10-CM | POA: Diagnosis not present

## 2022-05-03 DIAGNOSIS — M62830 Muscle spasm of back: Secondary | ICD-10-CM | POA: Diagnosis not present

## 2022-05-03 DIAGNOSIS — M5136 Other intervertebral disc degeneration, lumbar region: Secondary | ICD-10-CM | POA: Diagnosis not present

## 2022-05-03 DIAGNOSIS — M419 Scoliosis, unspecified: Secondary | ICD-10-CM | POA: Diagnosis not present

## 2022-05-03 DIAGNOSIS — Z79899 Other long term (current) drug therapy: Secondary | ICD-10-CM | POA: Diagnosis not present

## 2022-05-03 DIAGNOSIS — G8929 Other chronic pain: Secondary | ICD-10-CM | POA: Diagnosis not present

## 2022-05-03 DIAGNOSIS — M48062 Spinal stenosis, lumbar region with neurogenic claudication: Secondary | ICD-10-CM | POA: Diagnosis not present

## 2022-05-03 DIAGNOSIS — M545 Low back pain, unspecified: Secondary | ICD-10-CM | POA: Diagnosis not present

## 2022-05-09 DIAGNOSIS — E663 Overweight: Secondary | ICD-10-CM | POA: Diagnosis not present

## 2022-05-19 DIAGNOSIS — M545 Low back pain, unspecified: Secondary | ICD-10-CM | POA: Diagnosis not present

## 2022-05-19 DIAGNOSIS — R262 Difficulty in walking, not elsewhere classified: Secondary | ICD-10-CM | POA: Diagnosis not present

## 2022-05-23 DIAGNOSIS — G4733 Obstructive sleep apnea (adult) (pediatric): Secondary | ICD-10-CM | POA: Diagnosis not present

## 2022-05-23 DIAGNOSIS — R262 Difficulty in walking, not elsewhere classified: Secondary | ICD-10-CM | POA: Diagnosis not present

## 2022-05-23 DIAGNOSIS — M545 Low back pain, unspecified: Secondary | ICD-10-CM | POA: Diagnosis not present

## 2022-05-26 DIAGNOSIS — R262 Difficulty in walking, not elsewhere classified: Secondary | ICD-10-CM | POA: Diagnosis not present

## 2022-05-26 DIAGNOSIS — M545 Low back pain, unspecified: Secondary | ICD-10-CM | POA: Diagnosis not present

## 2022-05-29 DIAGNOSIS — F5105 Insomnia due to other mental disorder: Secondary | ICD-10-CM | POA: Diagnosis not present

## 2022-05-29 DIAGNOSIS — M545 Low back pain, unspecified: Secondary | ICD-10-CM | POA: Diagnosis not present

## 2022-05-29 DIAGNOSIS — R262 Difficulty in walking, not elsewhere classified: Secondary | ICD-10-CM | POA: Diagnosis not present

## 2022-05-29 DIAGNOSIS — F331 Major depressive disorder, recurrent, moderate: Secondary | ICD-10-CM | POA: Diagnosis not present

## 2022-05-30 DIAGNOSIS — H16223 Keratoconjunctivitis sicca, not specified as Sjogren's, bilateral: Secondary | ICD-10-CM | POA: Diagnosis not present

## 2022-05-31 DIAGNOSIS — K581 Irritable bowel syndrome with constipation: Secondary | ICD-10-CM | POA: Diagnosis not present

## 2022-05-31 DIAGNOSIS — K219 Gastro-esophageal reflux disease without esophagitis: Secondary | ICD-10-CM | POA: Diagnosis not present

## 2022-05-31 DIAGNOSIS — K862 Cyst of pancreas: Secondary | ICD-10-CM | POA: Diagnosis not present

## 2022-06-01 ENCOUNTER — Other Ambulatory Visit: Payer: Self-pay | Admitting: Gastroenterology

## 2022-06-01 DIAGNOSIS — K862 Cyst of pancreas: Secondary | ICD-10-CM

## 2022-06-02 DIAGNOSIS — M545 Low back pain, unspecified: Secondary | ICD-10-CM | POA: Diagnosis not present

## 2022-06-02 DIAGNOSIS — R262 Difficulty in walking, not elsewhere classified: Secondary | ICD-10-CM | POA: Diagnosis not present

## 2022-06-05 DIAGNOSIS — Z1231 Encounter for screening mammogram for malignant neoplasm of breast: Secondary | ICD-10-CM | POA: Diagnosis not present

## 2022-06-05 DIAGNOSIS — E782 Mixed hyperlipidemia: Secondary | ICD-10-CM | POA: Diagnosis not present

## 2022-06-05 DIAGNOSIS — M461 Sacroiliitis, not elsewhere classified: Secondary | ICD-10-CM | POA: Diagnosis not present

## 2022-06-05 DIAGNOSIS — F321 Major depressive disorder, single episode, moderate: Secondary | ICD-10-CM | POA: Diagnosis not present

## 2022-06-05 DIAGNOSIS — Z0000 Encounter for general adult medical examination without abnormal findings: Secondary | ICD-10-CM | POA: Diagnosis not present

## 2022-06-05 DIAGNOSIS — Z79899 Other long term (current) drug therapy: Secondary | ICD-10-CM | POA: Diagnosis not present

## 2022-06-05 DIAGNOSIS — I10 Essential (primary) hypertension: Secondary | ICD-10-CM | POA: Diagnosis not present

## 2022-06-05 DIAGNOSIS — E559 Vitamin D deficiency, unspecified: Secondary | ICD-10-CM | POA: Diagnosis not present

## 2022-06-05 DIAGNOSIS — E890 Postprocedural hypothyroidism: Secondary | ICD-10-CM | POA: Diagnosis not present

## 2022-06-06 ENCOUNTER — Other Ambulatory Visit: Payer: Self-pay | Admitting: Internal Medicine

## 2022-06-06 DIAGNOSIS — Z1231 Encounter for screening mammogram for malignant neoplasm of breast: Secondary | ICD-10-CM

## 2022-06-07 DIAGNOSIS — Z79899 Other long term (current) drug therapy: Secondary | ICD-10-CM | POA: Diagnosis not present

## 2022-06-07 DIAGNOSIS — E782 Mixed hyperlipidemia: Secondary | ICD-10-CM | POA: Diagnosis not present

## 2022-06-07 DIAGNOSIS — E890 Postprocedural hypothyroidism: Secondary | ICD-10-CM | POA: Diagnosis not present

## 2022-06-12 DIAGNOSIS — M5136 Other intervertebral disc degeneration, lumbar region: Secondary | ICD-10-CM | POA: Diagnosis not present

## 2022-06-12 DIAGNOSIS — R262 Difficulty in walking, not elsewhere classified: Secondary | ICD-10-CM | POA: Diagnosis not present

## 2022-06-12 DIAGNOSIS — M48062 Spinal stenosis, lumbar region with neurogenic claudication: Secondary | ICD-10-CM | POA: Diagnosis not present

## 2022-06-12 DIAGNOSIS — M5416 Radiculopathy, lumbar region: Secondary | ICD-10-CM | POA: Diagnosis not present

## 2022-06-12 DIAGNOSIS — M545 Low back pain, unspecified: Secondary | ICD-10-CM | POA: Diagnosis not present

## 2022-06-13 ENCOUNTER — Ambulatory Visit
Admission: RE | Admit: 2022-06-13 | Discharge: 2022-06-13 | Disposition: A | Discharge status: Home / Self Care | Payer: PPO | Source: Ambulatory Visit | Attending: Gastroenterology | Admitting: Gastroenterology

## 2022-06-13 DIAGNOSIS — K449 Diaphragmatic hernia without obstruction or gangrene: Secondary | ICD-10-CM | POA: Diagnosis not present

## 2022-06-13 DIAGNOSIS — K862 Cyst of pancreas: Secondary | ICD-10-CM

## 2022-06-13 DIAGNOSIS — R935 Abnormal findings on diagnostic imaging of other abdominal regions, including retroperitoneum: Secondary | ICD-10-CM | POA: Diagnosis not present

## 2022-06-13 MED ORDER — GADOBUTROL 1 MMOL/ML IV SOLN
8.0000 mL | Freq: Once | INTRAVENOUS | Status: AC | PRN
  Administered 2022-06-13: 8 mL via INTRAVENOUS

## 2022-06-19 DIAGNOSIS — R262 Difficulty in walking, not elsewhere classified: Secondary | ICD-10-CM | POA: Diagnosis not present

## 2022-06-19 DIAGNOSIS — M545 Low back pain, unspecified: Secondary | ICD-10-CM | POA: Diagnosis not present

## 2022-06-21 DIAGNOSIS — M545 Low back pain, unspecified: Secondary | ICD-10-CM | POA: Diagnosis not present

## 2022-06-21 DIAGNOSIS — R262 Difficulty in walking, not elsewhere classified: Secondary | ICD-10-CM | POA: Diagnosis not present

## 2022-06-23 DIAGNOSIS — G4733 Obstructive sleep apnea (adult) (pediatric): Secondary | ICD-10-CM | POA: Diagnosis not present

## 2022-06-28 DIAGNOSIS — M545 Low back pain, unspecified: Secondary | ICD-10-CM | POA: Diagnosis not present

## 2022-06-28 DIAGNOSIS — R262 Difficulty in walking, not elsewhere classified: Secondary | ICD-10-CM | POA: Diagnosis not present

## 2022-06-29 DIAGNOSIS — R2689 Other abnormalities of gait and mobility: Secondary | ICD-10-CM | POA: Diagnosis not present

## 2022-06-29 DIAGNOSIS — D333 Benign neoplasm of cranial nerves: Secondary | ICD-10-CM | POA: Diagnosis not present

## 2022-06-29 DIAGNOSIS — G629 Polyneuropathy, unspecified: Secondary | ICD-10-CM | POA: Diagnosis not present

## 2022-06-29 DIAGNOSIS — R200 Anesthesia of skin: Secondary | ICD-10-CM | POA: Diagnosis not present

## 2022-07-05 DIAGNOSIS — X32XXXA Exposure to sunlight, initial encounter: Secondary | ICD-10-CM | POA: Diagnosis not present

## 2022-07-05 DIAGNOSIS — D225 Melanocytic nevi of trunk: Secondary | ICD-10-CM | POA: Diagnosis not present

## 2022-07-05 DIAGNOSIS — D2262 Melanocytic nevi of left upper limb, including shoulder: Secondary | ICD-10-CM | POA: Diagnosis not present

## 2022-07-05 DIAGNOSIS — D2261 Melanocytic nevi of right upper limb, including shoulder: Secondary | ICD-10-CM | POA: Diagnosis not present

## 2022-07-05 DIAGNOSIS — Z85828 Personal history of other malignant neoplasm of skin: Secondary | ICD-10-CM | POA: Diagnosis not present

## 2022-07-05 DIAGNOSIS — D2271 Melanocytic nevi of right lower limb, including hip: Secondary | ICD-10-CM | POA: Diagnosis not present

## 2022-07-05 DIAGNOSIS — L570 Actinic keratosis: Secondary | ICD-10-CM | POA: Diagnosis not present

## 2022-07-07 ENCOUNTER — Other Ambulatory Visit: Payer: Self-pay | Admitting: Student

## 2022-07-07 DIAGNOSIS — R200 Anesthesia of skin: Secondary | ICD-10-CM

## 2022-07-12 ENCOUNTER — Ambulatory Visit
Admission: RE | Admit: 2022-07-12 | Discharge: 2022-07-12 | Disposition: A | Discharge status: Home / Self Care | Payer: PPO | Source: Ambulatory Visit | Attending: Student | Admitting: Student

## 2022-07-12 DIAGNOSIS — R200 Anesthesia of skin: Secondary | ICD-10-CM | POA: Insufficient documentation

## 2022-08-01 DIAGNOSIS — H16223 Keratoconjunctivitis sicca, not specified as Sjogren's, bilateral: Secondary | ICD-10-CM | POA: Diagnosis not present

## 2022-08-01 DIAGNOSIS — H168 Other keratitis: Secondary | ICD-10-CM | POA: Diagnosis not present

## 2022-08-01 DIAGNOSIS — H0259 Other disorders affecting eyelid function: Secondary | ICD-10-CM | POA: Diagnosis not present

## 2022-08-02 DIAGNOSIS — M5136 Other intervertebral disc degeneration, lumbar region: Secondary | ICD-10-CM | POA: Diagnosis not present

## 2022-08-02 DIAGNOSIS — Z79899 Other long term (current) drug therapy: Secondary | ICD-10-CM | POA: Diagnosis not present

## 2022-08-02 DIAGNOSIS — M48062 Spinal stenosis, lumbar region with neurogenic claudication: Secondary | ICD-10-CM | POA: Diagnosis not present

## 2022-08-02 DIAGNOSIS — M5416 Radiculopathy, lumbar region: Secondary | ICD-10-CM | POA: Diagnosis not present

## 2022-08-08 DIAGNOSIS — R399 Unspecified symptoms and signs involving the genitourinary system: Secondary | ICD-10-CM | POA: Diagnosis not present

## 2022-08-08 DIAGNOSIS — H168 Other keratitis: Secondary | ICD-10-CM | POA: Diagnosis not present

## 2022-08-29 DIAGNOSIS — F331 Major depressive disorder, recurrent, moderate: Secondary | ICD-10-CM | POA: Diagnosis not present

## 2022-08-29 DIAGNOSIS — F5105 Insomnia due to other mental disorder: Secondary | ICD-10-CM | POA: Diagnosis not present

## 2022-08-29 DIAGNOSIS — Z79899 Other long term (current) drug therapy: Secondary | ICD-10-CM | POA: Diagnosis not present

## 2022-08-30 DIAGNOSIS — I10 Essential (primary) hypertension: Secondary | ICD-10-CM | POA: Diagnosis not present

## 2022-08-30 DIAGNOSIS — F339 Major depressive disorder, recurrent, unspecified: Secondary | ICD-10-CM | POA: Diagnosis not present

## 2022-08-30 DIAGNOSIS — Z9884 Bariatric surgery status: Secondary | ICD-10-CM | POA: Diagnosis not present

## 2022-08-30 DIAGNOSIS — E663 Overweight: Secondary | ICD-10-CM | POA: Diagnosis not present

## 2022-08-30 DIAGNOSIS — E782 Mixed hyperlipidemia: Secondary | ICD-10-CM | POA: Diagnosis not present

## 2022-08-30 DIAGNOSIS — K219 Gastro-esophageal reflux disease without esophagitis: Secondary | ICD-10-CM | POA: Diagnosis not present

## 2022-08-30 DIAGNOSIS — D490 Neoplasm of unspecified behavior of digestive system: Secondary | ICD-10-CM | POA: Diagnosis not present

## 2022-09-06 DIAGNOSIS — Z79899 Other long term (current) drug therapy: Secondary | ICD-10-CM | POA: Diagnosis not present

## 2022-09-06 DIAGNOSIS — Z0000 Encounter for general adult medical examination without abnormal findings: Secondary | ICD-10-CM | POA: Diagnosis not present

## 2022-09-06 DIAGNOSIS — E559 Vitamin D deficiency, unspecified: Secondary | ICD-10-CM | POA: Diagnosis not present

## 2022-09-06 DIAGNOSIS — F321 Major depressive disorder, single episode, moderate: Secondary | ICD-10-CM | POA: Diagnosis not present

## 2022-09-06 DIAGNOSIS — I10 Essential (primary) hypertension: Secondary | ICD-10-CM | POA: Diagnosis not present

## 2022-09-06 DIAGNOSIS — E890 Postprocedural hypothyroidism: Secondary | ICD-10-CM | POA: Diagnosis not present

## 2022-09-06 DIAGNOSIS — E782 Mixed hyperlipidemia: Secondary | ICD-10-CM | POA: Diagnosis not present

## 2022-09-20 DIAGNOSIS — F321 Major depressive disorder, single episode, moderate: Secondary | ICD-10-CM | POA: Diagnosis not present

## 2022-09-20 DIAGNOSIS — I10 Essential (primary) hypertension: Secondary | ICD-10-CM | POA: Diagnosis not present

## 2022-09-20 DIAGNOSIS — E559 Vitamin D deficiency, unspecified: Secondary | ICD-10-CM | POA: Diagnosis not present

## 2022-09-20 DIAGNOSIS — E890 Postprocedural hypothyroidism: Secondary | ICD-10-CM | POA: Diagnosis not present

## 2022-09-20 DIAGNOSIS — E782 Mixed hyperlipidemia: Secondary | ICD-10-CM | POA: Diagnosis not present

## 2022-09-20 DIAGNOSIS — R5382 Chronic fatigue, unspecified: Secondary | ICD-10-CM | POA: Diagnosis not present

## 2022-09-27 ENCOUNTER — Ambulatory Visit
Admission: RE | Admit: 2022-09-27 | Discharge: 2022-09-27 | Disposition: A | Discharge status: Home / Self Care | Payer: PPO | Source: Ambulatory Visit | Attending: Internal Medicine | Admitting: Internal Medicine

## 2022-09-27 DIAGNOSIS — Z1231 Encounter for screening mammogram for malignant neoplasm of breast: Secondary | ICD-10-CM | POA: Insufficient documentation

## 2022-10-12 DIAGNOSIS — F5105 Insomnia due to other mental disorder: Secondary | ICD-10-CM | POA: Diagnosis not present

## 2022-10-12 DIAGNOSIS — F331 Major depressive disorder, recurrent, moderate: Secondary | ICD-10-CM | POA: Diagnosis not present

## 2022-10-17 DIAGNOSIS — K1379 Other lesions of oral mucosa: Secondary | ICD-10-CM | POA: Diagnosis not present

## 2022-10-26 DIAGNOSIS — R002 Palpitations: Secondary | ICD-10-CM | POA: Diagnosis not present

## 2022-11-03 DIAGNOSIS — Z8679 Personal history of other diseases of the circulatory system: Secondary | ICD-10-CM | POA: Diagnosis not present

## 2022-11-03 DIAGNOSIS — E782 Mixed hyperlipidemia: Secondary | ICD-10-CM | POA: Diagnosis not present

## 2022-11-03 DIAGNOSIS — G4733 Obstructive sleep apnea (adult) (pediatric): Secondary | ICD-10-CM | POA: Diagnosis not present

## 2022-11-03 DIAGNOSIS — I10 Essential (primary) hypertension: Secondary | ICD-10-CM | POA: Diagnosis not present

## 2022-11-03 DIAGNOSIS — I38 Endocarditis, valve unspecified: Secondary | ICD-10-CM | POA: Diagnosis not present

## 2022-11-03 DIAGNOSIS — R002 Palpitations: Secondary | ICD-10-CM | POA: Diagnosis not present

## 2022-11-03 DIAGNOSIS — I4719 Other supraventricular tachycardia: Secondary | ICD-10-CM | POA: Diagnosis not present

## 2022-11-07 ENCOUNTER — Other Ambulatory Visit: Payer: Self-pay | Admitting: Family Medicine

## 2022-11-07 DIAGNOSIS — M5416 Radiculopathy, lumbar region: Secondary | ICD-10-CM | POA: Diagnosis not present

## 2022-11-07 DIAGNOSIS — G8929 Other chronic pain: Secondary | ICD-10-CM | POA: Diagnosis not present

## 2022-11-07 DIAGNOSIS — M545 Low back pain, unspecified: Secondary | ICD-10-CM | POA: Diagnosis not present

## 2022-11-07 DIAGNOSIS — M62830 Muscle spasm of back: Secondary | ICD-10-CM | POA: Diagnosis not present

## 2022-11-07 DIAGNOSIS — Z79899 Other long term (current) drug therapy: Secondary | ICD-10-CM | POA: Diagnosis not present

## 2022-11-07 DIAGNOSIS — M419 Scoliosis, unspecified: Secondary | ICD-10-CM | POA: Diagnosis not present

## 2022-11-07 DIAGNOSIS — M48062 Spinal stenosis, lumbar region with neurogenic claudication: Secondary | ICD-10-CM | POA: Diagnosis not present

## 2022-11-07 DIAGNOSIS — M5136 Other intervertebral disc degeneration, lumbar region: Secondary | ICD-10-CM | POA: Diagnosis not present

## 2022-11-15 DIAGNOSIS — R002 Palpitations: Secondary | ICD-10-CM | POA: Diagnosis not present

## 2022-11-20 ENCOUNTER — Ambulatory Visit: Admission: RE | Admit: 2022-11-20 | Payer: PPO | Source: Ambulatory Visit

## 2022-11-20 DIAGNOSIS — M47816 Spondylosis without myelopathy or radiculopathy, lumbar region: Secondary | ICD-10-CM | POA: Diagnosis not present

## 2022-11-20 DIAGNOSIS — M5416 Radiculopathy, lumbar region: Secondary | ICD-10-CM

## 2022-11-23 DIAGNOSIS — E890 Postprocedural hypothyroidism: Secondary | ICD-10-CM | POA: Diagnosis not present

## 2022-11-23 DIAGNOSIS — R5383 Other fatigue: Secondary | ICD-10-CM | POA: Diagnosis not present

## 2022-11-28 DIAGNOSIS — M810 Age-related osteoporosis without current pathological fracture: Secondary | ICD-10-CM | POA: Diagnosis not present

## 2022-11-28 DIAGNOSIS — M5116 Intervertebral disc disorders with radiculopathy, lumbar region: Secondary | ICD-10-CM | POA: Diagnosis not present

## 2022-11-28 DIAGNOSIS — M4726 Other spondylosis with radiculopathy, lumbar region: Secondary | ICD-10-CM | POA: Diagnosis not present

## 2022-11-28 DIAGNOSIS — M5136 Other intervertebral disc degeneration, lumbar region: Secondary | ICD-10-CM | POA: Diagnosis not present

## 2022-11-28 DIAGNOSIS — M5115 Intervertebral disc disorders with radiculopathy, thoracolumbar region: Secondary | ICD-10-CM | POA: Diagnosis not present

## 2022-11-28 DIAGNOSIS — M8580 Other specified disorders of bone density and structure, unspecified site: Secondary | ICD-10-CM | POA: Diagnosis not present

## 2022-11-28 DIAGNOSIS — M5416 Radiculopathy, lumbar region: Secondary | ICD-10-CM | POA: Diagnosis not present

## 2022-11-28 DIAGNOSIS — M419 Scoliosis, unspecified: Secondary | ICD-10-CM | POA: Diagnosis not present

## 2022-12-07 DIAGNOSIS — Z79899 Other long term (current) drug therapy: Secondary | ICD-10-CM | POA: Diagnosis not present

## 2022-12-07 DIAGNOSIS — Z13820 Encounter for screening for osteoporosis: Secondary | ICD-10-CM | POA: Diagnosis not present

## 2022-12-07 DIAGNOSIS — E782 Mixed hyperlipidemia: Secondary | ICD-10-CM | POA: Diagnosis not present

## 2022-12-07 DIAGNOSIS — F321 Major depressive disorder, single episode, moderate: Secondary | ICD-10-CM | POA: Diagnosis not present

## 2022-12-07 DIAGNOSIS — E890 Postprocedural hypothyroidism: Secondary | ICD-10-CM | POA: Diagnosis not present

## 2022-12-07 DIAGNOSIS — E559 Vitamin D deficiency, unspecified: Secondary | ICD-10-CM | POA: Diagnosis not present

## 2022-12-07 DIAGNOSIS — I10 Essential (primary) hypertension: Secondary | ICD-10-CM | POA: Diagnosis not present

## 2022-12-18 DIAGNOSIS — I10 Essential (primary) hypertension: Secondary | ICD-10-CM | POA: Diagnosis not present

## 2022-12-18 DIAGNOSIS — M810 Age-related osteoporosis without current pathological fracture: Secondary | ICD-10-CM | POA: Diagnosis not present

## 2022-12-18 DIAGNOSIS — G4733 Obstructive sleep apnea (adult) (pediatric): Secondary | ICD-10-CM | POA: Diagnosis not present

## 2022-12-18 DIAGNOSIS — Z8679 Personal history of other diseases of the circulatory system: Secondary | ICD-10-CM | POA: Diagnosis not present

## 2022-12-18 DIAGNOSIS — I4719 Other supraventricular tachycardia: Secondary | ICD-10-CM | POA: Diagnosis not present

## 2022-12-18 DIAGNOSIS — E782 Mixed hyperlipidemia: Secondary | ICD-10-CM | POA: Diagnosis not present

## 2022-12-18 DIAGNOSIS — R002 Palpitations: Secondary | ICD-10-CM | POA: Diagnosis not present

## 2022-12-20 DIAGNOSIS — M545 Low back pain, unspecified: Secondary | ICD-10-CM | POA: Diagnosis not present

## 2022-12-20 DIAGNOSIS — M47816 Spondylosis without myelopathy or radiculopathy, lumbar region: Secondary | ICD-10-CM | POA: Diagnosis not present

## 2022-12-20 DIAGNOSIS — G8929 Other chronic pain: Secondary | ICD-10-CM | POA: Diagnosis not present

## 2022-12-20 DIAGNOSIS — Z1330 Encounter for screening examination for mental health and behavioral disorders, unspecified: Secondary | ICD-10-CM | POA: Diagnosis not present

## 2022-12-26 DIAGNOSIS — E538 Deficiency of other specified B group vitamins: Secondary | ICD-10-CM | POA: Diagnosis not present

## 2022-12-28 DIAGNOSIS — M545 Low back pain, unspecified: Secondary | ICD-10-CM | POA: Diagnosis not present

## 2022-12-29 DIAGNOSIS — M545 Low back pain, unspecified: Secondary | ICD-10-CM | POA: Diagnosis not present

## 2023-01-01 DIAGNOSIS — M545 Low back pain, unspecified: Secondary | ICD-10-CM | POA: Diagnosis not present

## 2023-01-03 DIAGNOSIS — M545 Low back pain, unspecified: Secondary | ICD-10-CM | POA: Diagnosis not present

## 2023-01-25 DIAGNOSIS — F331 Major depressive disorder, recurrent, moderate: Secondary | ICD-10-CM | POA: Diagnosis not present

## 2023-01-25 DIAGNOSIS — Z79899 Other long term (current) drug therapy: Secondary | ICD-10-CM | POA: Diagnosis not present

## 2023-01-25 DIAGNOSIS — F5105 Insomnia due to other mental disorder: Secondary | ICD-10-CM | POA: Diagnosis not present

## 2023-01-26 DIAGNOSIS — E538 Deficiency of other specified B group vitamins: Secondary | ICD-10-CM | POA: Diagnosis not present

## 2023-01-29 DIAGNOSIS — E559 Vitamin D deficiency, unspecified: Secondary | ICD-10-CM | POA: Diagnosis not present

## 2023-01-29 DIAGNOSIS — E782 Mixed hyperlipidemia: Secondary | ICD-10-CM | POA: Diagnosis not present

## 2023-01-29 DIAGNOSIS — I10 Essential (primary) hypertension: Secondary | ICD-10-CM | POA: Diagnosis not present

## 2023-01-29 DIAGNOSIS — E890 Postprocedural hypothyroidism: Secondary | ICD-10-CM | POA: Diagnosis not present

## 2023-01-29 DIAGNOSIS — Z79899 Other long term (current) drug therapy: Secondary | ICD-10-CM | POA: Diagnosis not present

## 2023-01-30 DIAGNOSIS — M810 Age-related osteoporosis without current pathological fracture: Secondary | ICD-10-CM | POA: Diagnosis not present

## 2023-01-30 DIAGNOSIS — M5136 Other intervertebral disc degeneration, lumbar region: Secondary | ICD-10-CM | POA: Diagnosis not present

## 2023-01-30 DIAGNOSIS — M545 Low back pain, unspecified: Secondary | ICD-10-CM | POA: Diagnosis not present

## 2023-01-30 DIAGNOSIS — G8929 Other chronic pain: Secondary | ICD-10-CM | POA: Diagnosis not present

## 2023-01-30 DIAGNOSIS — M419 Scoliosis, unspecified: Secondary | ICD-10-CM | POA: Diagnosis not present

## 2023-02-01 DIAGNOSIS — M5416 Radiculopathy, lumbar region: Secondary | ICD-10-CM | POA: Diagnosis not present

## 2023-02-01 DIAGNOSIS — G8929 Other chronic pain: Secondary | ICD-10-CM | POA: Diagnosis not present

## 2023-02-01 DIAGNOSIS — M5136 Other intervertebral disc degeneration, lumbar region: Secondary | ICD-10-CM | POA: Diagnosis not present

## 2023-02-01 DIAGNOSIS — M7918 Myalgia, other site: Secondary | ICD-10-CM | POA: Diagnosis not present

## 2023-02-01 DIAGNOSIS — M419 Scoliosis, unspecified: Secondary | ICD-10-CM | POA: Diagnosis not present

## 2023-02-01 DIAGNOSIS — E782 Mixed hyperlipidemia: Secondary | ICD-10-CM | POA: Diagnosis not present

## 2023-02-01 DIAGNOSIS — I10 Essential (primary) hypertension: Secondary | ICD-10-CM | POA: Diagnosis not present

## 2023-02-01 DIAGNOSIS — Z79899 Other long term (current) drug therapy: Secondary | ICD-10-CM | POA: Diagnosis not present

## 2023-02-01 DIAGNOSIS — M545 Low back pain, unspecified: Secondary | ICD-10-CM | POA: Diagnosis not present

## 2023-02-01 DIAGNOSIS — M48062 Spinal stenosis, lumbar region with neurogenic claudication: Secondary | ICD-10-CM | POA: Diagnosis not present

## 2023-02-05 DIAGNOSIS — I10 Essential (primary) hypertension: Secondary | ICD-10-CM | POA: Diagnosis not present

## 2023-02-05 DIAGNOSIS — D490 Neoplasm of unspecified behavior of digestive system: Secondary | ICD-10-CM | POA: Diagnosis not present

## 2023-02-05 DIAGNOSIS — Z9884 Bariatric surgery status: Secondary | ICD-10-CM | POA: Diagnosis not present

## 2023-02-05 DIAGNOSIS — Z23 Encounter for immunization: Secondary | ICD-10-CM | POA: Diagnosis not present

## 2023-02-05 DIAGNOSIS — E663 Overweight: Secondary | ICD-10-CM | POA: Diagnosis not present

## 2023-02-05 DIAGNOSIS — E890 Postprocedural hypothyroidism: Secondary | ICD-10-CM | POA: Diagnosis not present

## 2023-02-05 DIAGNOSIS — E782 Mixed hyperlipidemia: Secondary | ICD-10-CM | POA: Diagnosis not present

## 2023-02-05 DIAGNOSIS — K219 Gastro-esophageal reflux disease without esophagitis: Secondary | ICD-10-CM | POA: Diagnosis not present

## 2023-02-05 DIAGNOSIS — Z6827 Body mass index (BMI) 27.0-27.9, adult: Secondary | ICD-10-CM | POA: Diagnosis not present

## 2023-02-14 DIAGNOSIS — D2262 Melanocytic nevi of left upper limb, including shoulder: Secondary | ICD-10-CM | POA: Diagnosis not present

## 2023-02-14 DIAGNOSIS — X32XXXA Exposure to sunlight, initial encounter: Secondary | ICD-10-CM | POA: Diagnosis not present

## 2023-02-14 DIAGNOSIS — L821 Other seborrheic keratosis: Secondary | ICD-10-CM | POA: Diagnosis not present

## 2023-02-14 DIAGNOSIS — Z85828 Personal history of other malignant neoplasm of skin: Secondary | ICD-10-CM | POA: Diagnosis not present

## 2023-02-14 DIAGNOSIS — L570 Actinic keratosis: Secondary | ICD-10-CM | POA: Diagnosis not present

## 2023-02-14 DIAGNOSIS — D225 Melanocytic nevi of trunk: Secondary | ICD-10-CM | POA: Diagnosis not present

## 2023-02-14 DIAGNOSIS — L578 Other skin changes due to chronic exposure to nonionizing radiation: Secondary | ICD-10-CM | POA: Diagnosis not present

## 2023-02-14 DIAGNOSIS — L538 Other specified erythematous conditions: Secondary | ICD-10-CM | POA: Diagnosis not present

## 2023-02-14 DIAGNOSIS — L820 Inflamed seborrheic keratosis: Secondary | ICD-10-CM | POA: Diagnosis not present

## 2023-02-19 DIAGNOSIS — I10 Essential (primary) hypertension: Secondary | ICD-10-CM | POA: Diagnosis not present

## 2023-02-19 DIAGNOSIS — I471 Supraventricular tachycardia, unspecified: Secondary | ICD-10-CM | POA: Diagnosis not present

## 2023-02-19 DIAGNOSIS — I071 Rheumatic tricuspid insufficiency: Secondary | ICD-10-CM | POA: Diagnosis not present

## 2023-02-19 DIAGNOSIS — G4733 Obstructive sleep apnea (adult) (pediatric): Secondary | ICD-10-CM | POA: Diagnosis not present

## 2023-02-19 DIAGNOSIS — I4719 Other supraventricular tachycardia: Secondary | ICD-10-CM | POA: Diagnosis not present

## 2023-02-19 DIAGNOSIS — E782 Mixed hyperlipidemia: Secondary | ICD-10-CM | POA: Diagnosis not present

## 2023-02-26 DIAGNOSIS — E538 Deficiency of other specified B group vitamins: Secondary | ICD-10-CM | POA: Diagnosis not present

## 2023-03-12 DIAGNOSIS — D649 Anemia, unspecified: Secondary | ICD-10-CM | POA: Diagnosis not present

## 2023-03-12 DIAGNOSIS — E782 Mixed hyperlipidemia: Secondary | ICD-10-CM | POA: Diagnosis not present

## 2023-03-12 DIAGNOSIS — I10 Essential (primary) hypertension: Secondary | ICD-10-CM | POA: Diagnosis not present

## 2023-03-12 DIAGNOSIS — E559 Vitamin D deficiency, unspecified: Secondary | ICD-10-CM | POA: Diagnosis not present

## 2023-03-12 DIAGNOSIS — E890 Postprocedural hypothyroidism: Secondary | ICD-10-CM | POA: Diagnosis not present

## 2023-03-27 DIAGNOSIS — H903 Sensorineural hearing loss, bilateral: Secondary | ICD-10-CM | POA: Diagnosis not present

## 2023-03-29 DIAGNOSIS — E538 Deficiency of other specified B group vitamins: Secondary | ICD-10-CM | POA: Diagnosis not present

## 2023-04-13 DIAGNOSIS — Z79899 Other long term (current) drug therapy: Secondary | ICD-10-CM | POA: Diagnosis not present

## 2023-04-13 DIAGNOSIS — R399 Unspecified symptoms and signs involving the genitourinary system: Secondary | ICD-10-CM | POA: Diagnosis not present

## 2023-04-17 DIAGNOSIS — R399 Unspecified symptoms and signs involving the genitourinary system: Secondary | ICD-10-CM | POA: Diagnosis not present

## 2023-04-23 DIAGNOSIS — F331 Major depressive disorder, recurrent, moderate: Secondary | ICD-10-CM | POA: Diagnosis not present

## 2023-04-23 DIAGNOSIS — F5105 Insomnia due to other mental disorder: Secondary | ICD-10-CM | POA: Diagnosis not present

## 2023-04-26 DIAGNOSIS — M48062 Spinal stenosis, lumbar region with neurogenic claudication: Secondary | ICD-10-CM | POA: Diagnosis not present

## 2023-04-26 DIAGNOSIS — G8929 Other chronic pain: Secondary | ICD-10-CM | POA: Diagnosis not present

## 2023-04-26 DIAGNOSIS — M5416 Radiculopathy, lumbar region: Secondary | ICD-10-CM | POA: Diagnosis not present

## 2023-04-26 DIAGNOSIS — Z79899 Other long term (current) drug therapy: Secondary | ICD-10-CM | POA: Diagnosis not present

## 2023-04-26 DIAGNOSIS — M419 Scoliosis, unspecified: Secondary | ICD-10-CM | POA: Diagnosis not present

## 2023-04-26 DIAGNOSIS — M19031 Primary osteoarthritis, right wrist: Secondary | ICD-10-CM | POA: Diagnosis not present

## 2023-04-26 DIAGNOSIS — M545 Low back pain, unspecified: Secondary | ICD-10-CM | POA: Diagnosis not present

## 2023-04-26 DIAGNOSIS — M1811 Unilateral primary osteoarthritis of first carpometacarpal joint, right hand: Secondary | ICD-10-CM | POA: Diagnosis not present

## 2023-04-26 DIAGNOSIS — M25531 Pain in right wrist: Secondary | ICD-10-CM | POA: Diagnosis not present

## 2023-04-26 DIAGNOSIS — M5380 Other specified dorsopathies, site unspecified: Secondary | ICD-10-CM | POA: Diagnosis not present

## 2023-05-21 DIAGNOSIS — I10 Essential (primary) hypertension: Secondary | ICD-10-CM | POA: Diagnosis not present

## 2023-05-21 DIAGNOSIS — E782 Mixed hyperlipidemia: Secondary | ICD-10-CM | POA: Diagnosis not present

## 2023-05-21 DIAGNOSIS — E663 Overweight: Secondary | ICD-10-CM | POA: Diagnosis not present

## 2023-05-21 DIAGNOSIS — F339 Major depressive disorder, recurrent, unspecified: Secondary | ICD-10-CM | POA: Diagnosis not present

## 2023-05-21 DIAGNOSIS — K219 Gastro-esophageal reflux disease without esophagitis: Secondary | ICD-10-CM | POA: Diagnosis not present

## 2023-05-21 DIAGNOSIS — Z6825 Body mass index (BMI) 25.0-25.9, adult: Secondary | ICD-10-CM | POA: Diagnosis not present

## 2023-05-23 DIAGNOSIS — E538 Deficiency of other specified B group vitamins: Secondary | ICD-10-CM | POA: Diagnosis not present

## 2023-06-04 DIAGNOSIS — H16223 Keratoconjunctivitis sicca, not specified as Sjogren's, bilateral: Secondary | ICD-10-CM | POA: Diagnosis not present

## 2023-06-13 DIAGNOSIS — R002 Palpitations: Secondary | ICD-10-CM | POA: Diagnosis not present

## 2023-06-13 DIAGNOSIS — E890 Postprocedural hypothyroidism: Secondary | ICD-10-CM | POA: Diagnosis not present

## 2023-06-13 DIAGNOSIS — E042 Nontoxic multinodular goiter: Secondary | ICD-10-CM | POA: Diagnosis not present

## 2023-06-13 DIAGNOSIS — D333 Benign neoplasm of cranial nerves: Secondary | ICD-10-CM | POA: Diagnosis not present

## 2023-06-13 DIAGNOSIS — Z0000 Encounter for general adult medical examination without abnormal findings: Secondary | ICD-10-CM | POA: Diagnosis not present

## 2023-06-13 DIAGNOSIS — M810 Age-related osteoporosis without current pathological fracture: Secondary | ICD-10-CM | POA: Diagnosis not present

## 2023-06-13 DIAGNOSIS — I10 Essential (primary) hypertension: Secondary | ICD-10-CM | POA: Diagnosis not present

## 2023-06-13 DIAGNOSIS — G4733 Obstructive sleep apnea (adult) (pediatric): Secondary | ICD-10-CM | POA: Diagnosis not present

## 2023-06-13 DIAGNOSIS — E559 Vitamin D deficiency, unspecified: Secondary | ICD-10-CM | POA: Diagnosis not present

## 2023-06-13 DIAGNOSIS — R8290 Unspecified abnormal findings in urine: Secondary | ICD-10-CM | POA: Diagnosis not present

## 2023-06-13 DIAGNOSIS — Z79899 Other long term (current) drug therapy: Secondary | ICD-10-CM | POA: Diagnosis not present

## 2023-06-13 DIAGNOSIS — E782 Mixed hyperlipidemia: Secondary | ICD-10-CM | POA: Diagnosis not present

## 2023-06-13 DIAGNOSIS — I4719 Other supraventricular tachycardia: Secondary | ICD-10-CM | POA: Diagnosis not present

## 2023-06-13 DIAGNOSIS — I471 Supraventricular tachycardia, unspecified: Secondary | ICD-10-CM | POA: Diagnosis not present

## 2023-06-13 DIAGNOSIS — F321 Major depressive disorder, single episode, moderate: Secondary | ICD-10-CM | POA: Diagnosis not present

## 2023-06-15 DIAGNOSIS — M5136 Other intervertebral disc degeneration, lumbar region with discogenic back pain only: Secondary | ICD-10-CM | POA: Diagnosis not present

## 2023-06-15 DIAGNOSIS — M5135 Other intervertebral disc degeneration, thoracolumbar region: Secondary | ICD-10-CM | POA: Diagnosis not present

## 2023-06-15 DIAGNOSIS — M419 Scoliosis, unspecified: Secondary | ICD-10-CM | POA: Diagnosis not present

## 2023-06-15 DIAGNOSIS — M4854XA Collapsed vertebra, not elsewhere classified, thoracic region, initial encounter for fracture: Secondary | ICD-10-CM | POA: Diagnosis not present

## 2023-06-16 ENCOUNTER — Emergency Department
Admission: EM | Admit: 2023-06-16 | Discharge: 2023-06-16 | Disposition: A | Discharge status: Home / Self Care | Payer: PPO | Attending: Emergency Medicine | Admitting: Emergency Medicine

## 2023-06-16 DIAGNOSIS — I471 Supraventricular tachycardia, unspecified: Secondary | ICD-10-CM | POA: Diagnosis not present

## 2023-06-16 DIAGNOSIS — R0989 Other specified symptoms and signs involving the circulatory and respiratory systems: Secondary | ICD-10-CM | POA: Diagnosis not present

## 2023-06-16 DIAGNOSIS — I959 Hypotension, unspecified: Secondary | ICD-10-CM | POA: Diagnosis not present

## 2023-06-16 DIAGNOSIS — R000 Tachycardia, unspecified: Secondary | ICD-10-CM | POA: Diagnosis present

## 2023-06-16 DIAGNOSIS — D72829 Elevated white blood cell count, unspecified: Secondary | ICD-10-CM | POA: Insufficient documentation

## 2023-06-16 LAB — CBC
HCT: 29.5 % — ABNORMAL LOW (ref 36.0–46.0)
Hemoglobin: 9.8 g/dL — ABNORMAL LOW (ref 12.0–15.0)
MCH: 31.2 pg (ref 26.0–34.0)
MCHC: 33.2 g/dL (ref 30.0–36.0)
MCV: 93.9 fL (ref 80.0–100.0)
Platelets: 202 10*3/uL (ref 150–400)
RBC: 3.14 MIL/uL — ABNORMAL LOW (ref 3.87–5.11)
RDW: 12.7 % (ref 11.5–15.5)
WBC: 17.6 10*3/uL — ABNORMAL HIGH (ref 4.0–10.5)
nRBC: 0 % (ref 0.0–0.2)

## 2023-06-16 LAB — COMPREHENSIVE METABOLIC PANEL
ALT: 11 U/L (ref 0–44)
AST: 19 U/L (ref 15–41)
Albumin: 3.1 g/dL — ABNORMAL LOW (ref 3.5–5.0)
Alkaline Phosphatase: 39 U/L (ref 38–126)
Anion gap: 8 (ref 5–15)
BUN: 16 mg/dL (ref 8–23)
CO2: 19 mmol/L — ABNORMAL LOW (ref 22–32)
Calcium: 8 mg/dL — ABNORMAL LOW (ref 8.9–10.3)
Chloride: 111 mmol/L (ref 98–111)
Creatinine, Ser: 0.98 mg/dL (ref 0.44–1.00)
GFR, Estimated: >60 mL/min (ref >60)
Glucose, Bld: 106 mg/dL — ABNORMAL HIGH (ref 70–99)
Potassium: 3.4 mmol/L — ABNORMAL LOW (ref 3.5–5.1)
Sodium: 138 mmol/L (ref 135–145)
Total Bilirubin: 0.5 mg/dL (ref 0.0–1.2)
Total Protein: 5.7 g/dL — ABNORMAL LOW (ref 6.5–8.1)

## 2023-06-16 MED ORDER — SODIUM CHLORIDE 0.9 % IV BOLUS
1000.0000 mL | Freq: Once | INTRAVENOUS | Status: AC
  Administered 2023-06-16: 1000 mL via INTRAVENOUS

## 2023-06-16 NOTE — ED Notes (Signed)
 Pt walked to toilet in room and back to bed w/o issue.  Pt denied dizziness or heart palpitations.

## 2023-06-16 NOTE — ED Notes (Signed)
 Pt verbalized understanding of discharge instructions. Opportunity for questions provided.

## 2023-06-16 NOTE — ED Triage Notes (Signed)
 Per EMS, Pt, from home, called  EMS after feeling heart fluttering.  Hx of SVT.  Pt found to be in SVT (HR 180).  Pt given 6mg  and 12mg  adenosine  en route.  Pt became hypotensive after first dose (60/40).  Pt given around 1500mL normal saline en route.   Pt currently in NSR (HR 92) and c/o headache.  Pain score 3/10.

## 2023-06-16 NOTE — ED Provider Notes (Signed)
 [facility] Provider Note    Event Date/Time   First MD Initiated Contact with Patient 06/16/23 1129     (approximate)  History   Chief Complaint: SVT/tachycardia  HPI  Barbara Mclaughlin is a 73 y.o. female with a past medical history of anemia, gastric reflux, hypertension, SVT, presents to the emergency department for rapid heart rate/weakness sensation.  According to the patient this morning she felt her heart began to race and felt somewhat weak.  Denies any shortness of breath or chest pain.  Patient states she has had similar symptoms in the past due to SVT.  Called EMS and patient was found to be in SVT.  En route to the hospital patient was hypotensive 60/40 per EMS, was given 6 mg of adenosine  without effect, just before arrival to the emergency department they gave 12 mg of adenosine .  While rolling into the emergency department patient converted from SVT to what appears to be a sinus rhythm on her EMS rhythm strip which I have reviewed.  Patient states she is feeling much better currently.  Denies any chest pain or shortness of breath now or at any point.  Physical Exam   Triage Vital Signs: ED Triage Vitals  Encounter Vitals Group     BP      Systolic BP Percentile      Diastolic BP Percentile      Pulse      Resp      Temp      Temp src      SpO2      Weight      Height      Head Circumference      Peak Flow      Pain Score      Pain Loc      Pain Education      Exclude from Growth Chart     Most recent vital signs: There were no vitals filed for this visit.  General: Awake, no distress.  CV:  Good peripheral perfusion.  Regular rate and rhythm around 100 bpm. Resp:  Normal effort.  Equal breath sounds bilaterally.  Abd:  No distention.  Soft, nontender.  No rebound or guarding.  ED Results / Procedures / Treatments   EKG  I have reviewed and interpreted patient's more recent EKG showing sinus tachycardia 102 bpm with a  narrow QRS, normal axis, normal intervals/slight QTc prolongation occasional PVC. Patient's cardiac monitor on telemetry shows sinus rhythm around 75 to 80 bpm.  No longer showing PVCs.   MEDICATIONS ORDERED IN ED: Medications  sodium chloride  0.9 % bolus 1,000 mL (has no administration in time range)     IMPRESSION / MDM / ASSESSMENT AND PLAN / ED COURSE  I reviewed the triage vital signs and the nursing notes.  Patient's presentation is most consistent with acute presentation with potential threat to life or bodily function.  Patient presents to the emergency department for fatigue and tachycardia.  Patient found to be in SVT by EMS (I have reviewed the EMS rhythm strips) received 6 mg and then 12 mg of adenosine  the patient converted just prior to arrival.  Here the patient appears to be in a sinus tachycardia rhythm.  We will check labs we will IV hydrate we will continue to closely monitor on cardiac monitoring.  Patient agreeable to plan of care and states she is feeling much better.  Patient's workup shows mild leukocytosis patient has mild anemia which is  baseline for the patient actually states she has a follow-up with hematology regarding her anemia.  Chemistry is reassuring showing no significant finding.  Patient states she is feeling much better, blood pressure has remained on the lower end between 90 and 100, given 2 L of fluid is now up to 102/56, I rechecked it in the room currently 104 systolic.  Patient denies any symptoms has ambulated without any issue.  She is asking to be discharged home.  She has a cardiologist and states she will follow-up with them.  I discussed with the patient to drink plenty fluids and to check her blood pressure at home each morning and to skip her blood pressure medication if her pressure is below 130 systolic.  Patient agreeable to this plan of care and will follow-up with her doctor as well as cardiologist.  I discussed return precautions for any  shortness of breath any palpitations chest pain or any weakness.  FINAL CLINICAL IMPRESSION(S) / ED DIAGNOSES   SVT   Note:  This document was prepared using Dragon voice recognition software and may include unintentional dictation errors.   Dorothyann Drivers, MD 06/16/23 1408

## 2023-06-16 NOTE — Discharge Instructions (Signed)
 As we discussed please drink plenty fluids and obtain plenty of rest.  Please follow-up with your cardiologist as well as your primary care doctor on Monday for recheck/reevaluation.  Return to the emergency department for any further palpitations any shortness of breath, chest pain, weakness or any other symptom personally concerning to yourself.  Also as we discussed your blood pressure has remained on the lower but stable range.  Please check your blood pressure each morning at home, if the top number is below 130 please do not take your blood pressure medication.  Please discuss this with your primary care doctor.

## 2023-06-18 ENCOUNTER — Encounter: Payer: Self-pay | Admitting: Oncology

## 2023-06-18 ENCOUNTER — Inpatient Hospital Stay: Payer: PPO | Attending: Oncology | Admitting: Oncology

## 2023-06-18 ENCOUNTER — Inpatient Hospital Stay: Payer: PPO

## 2023-06-18 VITALS — BP 118/66 | HR 63 | Temp 97.8°F | Resp 20 | Wt 165.0 lb

## 2023-06-18 DIAGNOSIS — Z803 Family history of malignant neoplasm of breast: Secondary | ICD-10-CM | POA: Insufficient documentation

## 2023-06-18 DIAGNOSIS — D649 Anemia, unspecified: Secondary | ICD-10-CM | POA: Insufficient documentation

## 2023-06-18 DIAGNOSIS — Z79899 Other long term (current) drug therapy: Secondary | ICD-10-CM | POA: Insufficient documentation

## 2023-06-18 DIAGNOSIS — R5382 Chronic fatigue, unspecified: Secondary | ICD-10-CM | POA: Diagnosis not present

## 2023-06-18 LAB — FERRITIN: Ferritin: 194 ng/mL (ref 11–307)

## 2023-06-18 LAB — RETICULOCYTES
Immature Retic Fract: 8.1 % (ref 2.3–15.9)
RBC.: 3.27 MIL/uL — ABNORMAL LOW (ref 3.87–5.11)
Retic Count, Absolute: 27.5 10*3/uL (ref 19.0–186.0)
Retic Ct Pct: 0.8 % (ref 0.4–3.1)

## 2023-06-18 LAB — FOLATE: Folate: >40 ng/mL

## 2023-06-18 LAB — CBC (CANCER CENTER ONLY)
HCT: 31.1 % — ABNORMAL LOW (ref 36.0–46.0)
Hemoglobin: 10 g/dL — ABNORMAL LOW (ref 12.0–15.0)
MCH: 30.5 pg (ref 26.0–34.0)
MCHC: 32.2 g/dL (ref 30.0–36.0)
MCV: 94.8 fL (ref 80.0–100.0)
Platelet Count: 243 10*3/uL (ref 150–400)
RBC: 3.28 MIL/uL — ABNORMAL LOW (ref 3.87–5.11)
RDW: 13 % (ref 11.5–15.5)
WBC Count: 8.7 10*3/uL (ref 4.0–10.5)
nRBC: 0 % (ref 0.0–0.2)

## 2023-06-18 LAB — LACTATE DEHYDROGENASE: LDH: 143 U/L (ref 98–192)

## 2023-06-18 LAB — IRON AND TIBC
Iron: 110 ug/dL (ref 28–170)
Saturation Ratios: 43 % — ABNORMAL HIGH (ref 10.4–31.8)
TIBC: 258 ug/dL (ref 250–450)
UIBC: 148 ug/dL

## 2023-06-18 LAB — VITAMIN B12: Vitamin B-12: 1689 pg/mL — ABNORMAL HIGH (ref 180–914)

## 2023-06-18 NOTE — Progress Notes (Signed)
 [facility]  [phone] [phone]) [phone]  ID: Barbara Mclaughlin OB: Jul 03, 1950  MR#: [id number]  AOZ#:[id number]  Patient Care Team: Yehuda Helms, MD as PCP - General (Internal Medicine)  CHIEF COMPLAINT: Anemia, unspecified.  INTERVAL HISTORY: Patient is a 73 year old female who was noted to have a persistently decreased hemoglobin despite oral iron supplementation.  She is referred for further evaluation.  She has chronic fatigue, but otherwise feels well.  She has no neurologic complaints.  She denies any recent fevers or illnesses.  She has a good appetite and denies weight loss.  She has no chest pain, shortness of breath, cough, or hemoptysis.  She denies any nausea, vomiting, constipation, or diarrhea.  She has no melena or hematochezia.  She has no urinary complaints.  Patient offers no further specific complaints today.  REVIEW OF SYSTEMS:   Review of Systems  Constitutional:  Positive for malaise/fatigue. Negative for chills and fever.  Respiratory: Negative.  Negative for cough, hemoptysis and shortness of breath.   Cardiovascular: Negative.  Negative for chest pain and leg swelling.  Gastrointestinal: Negative.  Negative for abdominal pain, blood in stool and melena.  Genitourinary: Negative.  Negative for hematuria.  Musculoskeletal: Negative.  Negative for back pain.  Skin: Negative.  Negative for rash.  Neurological: Negative.  Negative for dizziness, focal weakness, weakness and headaches.  Psychiatric/Behavioral: Negative.  The patient is not nervous/anxious.     As per HPI. Otherwise, a complete review of systems is negative.  PAST MEDICAL HISTORY: Past Medical History:  Diagnosis Date   Acoustic neuroma (HCC)    right ear   Anemia    Arthritis    B12 deficiency    Basal cell carcinoma, eyelid    Chronic fatigue syndrome    Chronic pain 11/20/2011   Depression    Dysrhythmia 0ne episode of tachycardia on 10/03/14    GERD (gastroesophageal reflux disease)    Headache    Heart murmur bruit   Hypertension    Hyperthyroidism 01/05/2015   Osteoporosis    Palpitations    Postablative hypothyroidism 09/03/2015   SVT (supraventricular tachycardia) (HCC)    Thyroid  nodule    Vitamin D  deficiency     PAST SURGICAL HISTORY: Past Surgical History:  Procedure Laterality Date   BREAST REDUCTION SURGERY  1990   COLONOSCOPY WITH PROPOFOL  N/A 11/22/2020   Procedure: COLONOSCOPY WITH PROPOFOL ;  Surgeon: Shane Darling, MD;  Location: [facility] ENDOSCOPY;  Service: Endoscopy;  Laterality: N/A;   ESOPHAGOGASTRODUODENOSCOPY  11/22/2020   Procedure: ESOPHAGOGASTRODUODENOSCOPY (EGD);  Surgeon: Shane Darling, MD;  Location: [facility] ENDOSCOPY;  Service: Endoscopy;;   EYE SURGERY Bilateral cataract removal   GASTRIC BYPASS     LAPAROSCOPIC HYSTERECTOMY  07/2009   REDUCTION MAMMAPLASTY Bilateral 20+ yrs ago   TOTAL KNEE ARTHROPLASTY  2013   Dr. Aubry Blase, left   TOTAL KNEE ARTHROPLASTY Right 06/2013    FAMILY HISTORY: Family History  Problem Relation Age of Onset   Hypertension Mother    Depression Mother    Obesity Mother    Alcoholism Father    Breast cancer Paternal Aunt     ADVANCED DIRECTIVES (Y/N):  N  HEALTH MAINTENANCE: Social History   Tobacco Use   Smoking status: Never   Smokeless tobacco: Never  Vaping Use   Vaping status: Never Used  Substance Use Topics   Alcohol use: No   Drug use: No     Colonoscopy:  PAP:  Bone density:  Lipid panel:  Allergies  Allergen Reactions   Effexor  [Venlafaxine ]     agitation   Topamax [Topiramate]     Tingling in arms    Current Outpatient Medications  Medication Sig Dispense Refill   ALPRAZolam (XANAX) 0.25 MG tablet Take by mouth.     bisacodyl (BISACODYL) 5 MG EC tablet Take 5 mg by mouth daily as needed.      buPROPion  (WELLBUTRIN  XL) 300 MG 24 hr tablet      carvedilol  (COREG ) 3.125 MG tablet Take by mouth.     celecoxib  (CELEBREX ) 100  MG capsule TAKE ONE CAPSULE BY MOUTH TWICE DAILY 180 capsule 0   Cholecalciferol (VITAMIN D3) 2000 UNITS capsule Take 2,000 Units by mouth daily.     Cyanocobalamin  (VITAMIN B-12) 250 MCG TABS Take 250 mcg by mouth daily.     docusate sodium  (COLACE) 100 MG capsule Take by mouth.     esomeprazole  (NEXIUM ) 40 MG capsule Take 80 mg by mouth every morning.     ferrous sulfate 325 (65 FE) MG tablet Take by mouth.     fluocinonide  ointment (LIDEX ) 0.05 % Apply topically 2 (two) times daily. 30 g 0   levothyroxine  (SYNTHROID , LEVOTHROID) 25 MCG tablet Take 75 mcg by mouth.      liothyronine  (CYTOMEL ) 5 MCG tablet Take by mouth.     loratadine (CLARITIN) 10 MG tablet Take 10 mg by mouth daily.     losartan  (COZAAR ) 50 MG tablet TAKE ONE TABLET BY MOUTH ONCE DAILY 90 tablet 3   Multiple Vitamin (MULTIVITAMIN) capsule Take 1 capsule by mouth daily.     QUEtiapine  (SEROQUEL ) 50 MG tablet Take by mouth.     ranitidine (ZANTAC) 150 MG tablet Take by mouth.     rosuvastatin  (CRESTOR ) 10 MG tablet Take by mouth.     Semaglutide, 1 MG/DOSE, 4 MG/3ML SOPN Inject into the skin.     valsartan (DIOVAN) 160 MG tablet Take 1 tablet by mouth 2 (two) times daily.     Vitamin D , Ergocalciferol , (DRISDOL ) 1.25 MG (50000 UNIT) CAPS capsule Take 1 capsule (50,000 Units total) by mouth every 7 (seven) days. 4 capsule 0   FLUoxetine  (PROZAC ) 20 MG capsule TAKE TWO CAPSULES BY MOUTH IN THE MORNING AND TAKE ONE CAPSULE BY MOUTH IN THE EVENING (Patient not taking: Reported on 06/18/2023) 270 capsule 0   oxyCODONE -acetaminophen  (PERCOCET/ROXICET) 5-325 MG tablet Take by mouth. (Patient not taking: Reported on 06/18/2023)     predniSONE (DELTASONE) 10 MG tablet Taper 6-5-4-3-2-1-off (Patient not taking: Reported on 06/18/2023)     No current facility-administered medications for this visit.    OBJECTIVE: Vitals:   06/18/23 1338  BP: 118/66  Pulse: 63  Resp: 20  Temp: 97.8 F (36.6 C)  SpO2: 100%     Body mass index is  26.63 kg/m.    ECOG FS:1 - Symptomatic but completely ambulatory  General: Well-developed, well-nourished, no acute distress. Eyes: Pink conjunctiva, anicteric sclera. HEENT: Normocephalic, moist mucous membranes. Lungs: No audible wheezing or coughing. Heart: Regular rate and rhythm. Abdomen: Soft, nontender, no obvious distention. Musculoskeletal: No edema, cyanosis, or clubbing. Neuro: Alert, answering all questions appropriately. Cranial nerves grossly intact. Skin: No rashes or petechiae noted. Psych: Normal affect. Lymphatics: No cervical, calvicular, axillary or inguinal LAD.   LAB RESULTS:  Lab Results  Component Value Date   NA 138 06/16/2023   K 3.4 (L) 06/16/2023   CL 111 06/16/2023   CO2 19 (L) 06/16/2023   GLUCOSE 106 (H) 06/16/2023  BUN 16 06/16/2023   CREATININE 0.98 06/16/2023   CALCIUM  8.0 (L) 06/16/2023   PROT 5.7 (L) 06/16/2023   ALBUMIN 3.1 (L) 06/16/2023   AST 19 06/16/2023   ALT 11 06/16/2023   ALKPHOS 39 06/16/2023   BILITOT 0.5 06/16/2023   GFRNONAA >60 06/16/2023   GFRAA >60 12/23/2017    Lab Results  Component Value Date   WBC 8.7 06/18/2023   NEUTROABS 4.5 12/23/2017   HGB 10.0 (L) 06/18/2023   HCT 31.1 (L) 06/18/2023   MCV 94.8 06/18/2023   PLT 243 06/18/2023     STUDIES: No results found.  ASSESSMENT: Anemia, unspecified.  PLAN:    Anemia, unspecified: Patient's most recent hemoglobin is decreased, but stable at 10.0.  Previously iron stores are within normal limits, but repeat laboratory work from today is pending.  All of her other laboratory work from today including SPEP and [employer] myeloid panel is also pending at time of dictation.  No intervention is needed.  Return to clinic in 2 weeks for further evaluation and discussion of her results.  I spent a total of 45 minutes reviewing chart data, face-to-face evaluation with the patient, counseling and coordination of care as detailed above.   Patient expressed  understanding and was in agreement with this plan. She also understands that She can call clinic at any time with any questions, concerns, or complaints.    Shellie Dials, MD   06/18/2023 4:01 PM

## 2023-06-18 NOTE — Progress Notes (Signed)
 Patient states she keeps having low blood and has been taking iron tablets but nothing has changed,

## 2023-06-19 DIAGNOSIS — I4719 Other supraventricular tachycardia: Secondary | ICD-10-CM | POA: Diagnosis not present

## 2023-06-19 DIAGNOSIS — I10 Essential (primary) hypertension: Secondary | ICD-10-CM | POA: Diagnosis not present

## 2023-06-19 DIAGNOSIS — E782 Mixed hyperlipidemia: Secondary | ICD-10-CM | POA: Diagnosis not present

## 2023-06-19 DIAGNOSIS — G4733 Obstructive sleep apnea (adult) (pediatric): Secondary | ICD-10-CM | POA: Diagnosis not present

## 2023-06-19 DIAGNOSIS — I471 Supraventricular tachycardia, unspecified: Secondary | ICD-10-CM | POA: Diagnosis not present

## 2023-06-19 DIAGNOSIS — R002 Palpitations: Secondary | ICD-10-CM | POA: Diagnosis not present

## 2023-06-19 LAB — ERYTHROPOIETIN: Erythropoietin: 18.3 m[IU]/mL (ref 2.6–18.5)

## 2023-06-20 DIAGNOSIS — D649 Anemia, unspecified: Secondary | ICD-10-CM | POA: Diagnosis not present

## 2023-06-20 DIAGNOSIS — I10 Essential (primary) hypertension: Secondary | ICD-10-CM | POA: Diagnosis not present

## 2023-06-20 DIAGNOSIS — I471 Supraventricular tachycardia, unspecified: Secondary | ICD-10-CM | POA: Diagnosis not present

## 2023-06-20 DIAGNOSIS — N390 Urinary tract infection, site not specified: Secondary | ICD-10-CM | POA: Diagnosis not present

## 2023-06-20 LAB — PROTEIN ELECTROPHORESIS, SERUM
A/G Ratio: 1.3 (ref 0.7–1.7)
Albumin ELP: 3.3 g/dL (ref 2.9–4.4)
Alpha-1-Globulin: 0.3 g/dL (ref 0.0–0.4)
Alpha-2-Globulin: 0.6 g/dL (ref 0.4–1.0)
Beta Globulin: 0.7 g/dL (ref 0.7–1.3)
Gamma Globulin: 0.8 g/dL (ref 0.4–1.8)
Globulin, Total: 2.5 g/dL (ref 2.2–3.9)
Total Protein ELP: 5.8 g/dL — ABNORMAL LOW (ref 6.0–8.5)

## 2023-06-20 LAB — HAPTOGLOBIN: Haptoglobin: 151 mg/dL (ref 42–346)

## 2023-06-25 DIAGNOSIS — E538 Deficiency of other specified B group vitamins: Secondary | ICD-10-CM | POA: Diagnosis not present

## 2023-06-29 DIAGNOSIS — I471 Supraventricular tachycardia, unspecified: Secondary | ICD-10-CM | POA: Diagnosis not present

## 2023-06-29 DIAGNOSIS — R002 Palpitations: Secondary | ICD-10-CM | POA: Diagnosis not present

## 2023-06-29 DIAGNOSIS — I779 Disorder of arteries and arterioles, unspecified: Secondary | ICD-10-CM | POA: Diagnosis not present

## 2023-07-02 ENCOUNTER — Inpatient Hospital Stay: Payer: PPO | Admitting: Oncology

## 2023-07-02 ENCOUNTER — Encounter: Payer: Self-pay | Admitting: Oncology

## 2023-07-02 VITALS — BP 131/74 | HR 56 | Temp 97.7°F | Resp 16 | Ht 66.0 in | Wt 165.0 lb

## 2023-07-02 DIAGNOSIS — D649 Anemia, unspecified: Secondary | ICD-10-CM | POA: Diagnosis not present

## 2023-07-02 NOTE — Progress Notes (Signed)
 [facility]  Telephone:(336) [phone] Fax:([phone]) [phone]  ID: Barbara Mclaughlin OB: 1950/09/10  MR#: [id number]  ZDG#:[id number]  Patient Care Team: Marguarite Arbour, MD as PCP - General (Internal Medicine)  CHIEF COMPLAINT: Anemia, unspecified.  INTERVAL HISTORY: Patient returns to clinic today for further evaluation and discussion of her laboratory results.  She currently feels well and is asymptomatic.  She has had no further episodes of SVT.  She has no neurologic complaints.  She denies any recent fevers or illnesses.  She has a good appetite and denies weight loss.  She has no chest pain, shortness of breath, cough, or hemoptysis.  She denies any nausea, vomiting, constipation, or diarrhea.  She has no melena or hematochezia.  She has no urinary complaints.  Patient offers no specific complaints today.  REVIEW OF SYSTEMS:   Review of Systems  Constitutional: Negative.  Negative for chills, fever and malaise/fatigue.  Respiratory: Negative.  Negative for cough, hemoptysis and shortness of breath.   Cardiovascular: Negative.  Negative for chest pain and leg swelling.  Gastrointestinal: Negative.  Negative for abdominal pain, blood in stool and melena.  Genitourinary: Negative.  Negative for hematuria.  Musculoskeletal: Negative.  Negative for back pain.  Skin: Negative.  Negative for rash.  Neurological: Negative.  Negative for dizziness, focal weakness, weakness and headaches.  Psychiatric/Behavioral: Negative.  The patient is not nervous/anxious.     As per HPI. Otherwise, a complete review of systems is negative.  PAST MEDICAL HISTORY: Past Medical History:  Diagnosis Date   Acoustic neuroma (HCC)    right ear   Anemia    Arthritis    B12 deficiency    Basal cell carcinoma, eyelid    Chronic fatigue syndrome    Chronic pain 11/20/2011   Depression    Dysrhythmia 0ne episode of tachycardia on 10/03/14   GERD (gastroesophageal reflux disease)     Headache    Heart murmur bruit   Hypertension    Hyperthyroidism 01/05/2015   Osteoporosis    Palpitations    Postablative hypothyroidism 09/03/2015   SVT (supraventricular tachycardia) (HCC)    Thyroid nodule    Vitamin D deficiency     PAST SURGICAL HISTORY: Past Surgical History:  Procedure Laterality Date   BREAST REDUCTION SURGERY  1990   COLONOSCOPY WITH PROPOFOL N/A 11/22/2020   Procedure: COLONOSCOPY WITH PROPOFOL;  Surgeon: Regis Bill, MD;  Location: [facility] ENDOSCOPY;  Service: Endoscopy;  Laterality: N/A;   ESOPHAGOGASTRODUODENOSCOPY  11/22/2020   Procedure: ESOPHAGOGASTRODUODENOSCOPY (EGD);  Surgeon: Regis Bill, MD;  Location: [facility] ENDOSCOPY;  Service: Endoscopy;;   EYE SURGERY Bilateral cataract removal   GASTRIC BYPASS     LAPAROSCOPIC HYSTERECTOMY  07/2009   REDUCTION MAMMAPLASTY Bilateral 20+ yrs ago   TOTAL KNEE ARTHROPLASTY  2013   Dr. Ernest Pine, left   TOTAL KNEE ARTHROPLASTY Right 06/2013    FAMILY HISTORY: Family History  Problem Relation Age of Onset   Hypertension Mother    Depression Mother    Obesity Mother    Alcoholism Father    Breast cancer Paternal Aunt     ADVANCED DIRECTIVES (Y/N):  N  HEALTH MAINTENANCE: Social History   Tobacco Use   Smoking status: Never   Smokeless tobacco: Never  Vaping Use   Vaping status: Never Used  Substance Use Topics   Alcohol use: No   Drug use: No     Colonoscopy:  PAP:  Bone density:  Lipid panel:  Allergies  Allergen Reactions  Effexor [Venlafaxine]     agitation   Topamax [Topiramate]     Tingling in arms    Current Outpatient Medications  Medication Sig Dispense Refill   bisacodyl (BISACODYL) 5 MG EC tablet Take 5 mg by mouth daily as needed.      buPROPion (WELLBUTRIN XL) 300 MG 24 hr tablet      carvedilol (COREG) 3.125 MG tablet Take by mouth.     celecoxib (CELEBREX) 100 MG capsule TAKE ONE CAPSULE BY MOUTH TWICE DAILY 180 capsule 0   Cholecalciferol (VITAMIN D3)  2000 UNITS capsule Take 2,000 Units by mouth daily.     Cyanocobalamin (VITAMIN B-12) 250 MCG TABS Take 250 mcg by mouth daily.     docusate sodium (COLACE) 100 MG capsule Take by mouth.     ferrous sulfate 325 (65 FE) MG tablet Take by mouth.     levothyroxine (SYNTHROID, LEVOTHROID) 25 MCG tablet Take 75 mcg by mouth.      liothyronine (CYTOMEL) 5 MCG tablet Take by mouth.     loratadine (CLARITIN) 10 MG tablet Take 10 mg by mouth daily.     Multiple Vitamin (MULTIVITAMIN) capsule Take 1 capsule by mouth daily.     QUEtiapine (SEROQUEL) 50 MG tablet Take by mouth.     rosuvastatin (CRESTOR) 10 MG tablet Take by mouth.     Semaglutide, 1 MG/DOSE, 4 MG/3ML SOPN Inject into the skin.     topiramate (TOPAMAX) 50 MG tablet Take 50 mg by mouth 2 (two) times daily.     Vitamin D, Ergocalciferol, (DRISDOL) 1.25 MG (50000 UNIT) CAPS capsule Take 1 capsule (50,000 Units total) by mouth every 7 (seven) days. 4 capsule 0   ALPRAZolam (XANAX) 0.25 MG tablet Take by mouth. (Patient not taking: Reported on 07/02/2023)     esomeprazole (NEXIUM) 40 MG capsule Take 80 mg by mouth every morning. (Patient not taking: Reported on 07/02/2023)     fluocinonide ointment (LIDEX) 0.05 % Apply topically 2 (two) times daily. (Patient not taking: Reported on 07/02/2023) 30 g 0   FLUoxetine (PROZAC) 20 MG capsule TAKE TWO CAPSULES BY MOUTH IN THE MORNING AND TAKE ONE CAPSULE BY MOUTH IN THE EVENING (Patient not taking: Reported on 07/02/2023) 270 capsule 0   losartan (COZAAR) 50 MG tablet TAKE ONE TABLET BY MOUTH ONCE DAILY (Patient not taking: Reported on 07/02/2023) 90 tablet 3   oxyCODONE-acetaminophen (PERCOCET/ROXICET) 5-325 MG tablet Take by mouth. (Patient not taking: Reported on 07/02/2023)     predniSONE (DELTASONE) 10 MG tablet Taper 6-5-4-3-2-1-off (Patient not taking: Reported on 07/02/2023)     ranitidine (ZANTAC) 150 MG tablet Take by mouth. (Patient not taking: Reported on 07/02/2023)     valsartan (DIOVAN) 160 MG  tablet Take 1 tablet by mouth 2 (two) times daily. (Patient not taking: Reported on 07/02/2023)     No current facility-administered medications for this visit.    OBJECTIVE: Vitals:   07/02/23 1007  BP: 131/74  Pulse: (!) 56  Resp: 16  Temp: 97.7 F (36.5 C)  SpO2: 100%     Body mass index is 26.63 kg/m.    ECOG FS:0 - Asymptomatic  General: Well-developed, well-nourished, no acute distress. Eyes: Pink conjunctiva, anicteric sclera. HEENT: Normocephalic, moist mucous membranes. Lungs: No audible wheezing or coughing. Heart: Regular rate and rhythm. Abdomen: Soft, nontender, no obvious distention. Musculoskeletal: No edema, cyanosis, or clubbing. Neuro: Alert, answering all questions appropriately. Cranial nerves grossly intact. Skin: No rashes or petechiae noted. Psych: Normal affect.  LAB RESULTS:  Lab Results  Component Value Date   NA 138 06/16/2023   K 3.4 (L) 06/16/2023   CL 111 06/16/2023   CO2 19 (L) 06/16/2023   GLUCOSE 106 (H) 06/16/2023   BUN 16 06/16/2023   CREATININE 0.98 06/16/2023   CALCIUM 8.0 (L) 06/16/2023   PROT 5.7 (L) 06/16/2023   ALBUMIN 3.1 (L) 06/16/2023   AST 19 06/16/2023   ALT 11 06/16/2023   ALKPHOS 39 06/16/2023   BILITOT 0.5 06/16/2023   GFRNONAA >60 06/16/2023   GFRAA >60 12/23/2017    Lab Results  Component Value Date   WBC 8.7 06/18/2023   NEUTROABS 4.5 12/23/2017   HGB 10.0 (L) 06/18/2023   HCT 31.1 (L) 06/18/2023   MCV 94.8 06/18/2023   PLT 243 06/18/2023     STUDIES: No results found.  ASSESSMENT: Anemia, unspecified.  PLAN:    Anemia, unspecified: Patient's most recent hemoglobin is decreased, but stable at 10.0.  All of her other laboratory work is either negative or within normal limits.   [facility] myeloid panel is pending at time of dictation.  No intervention is needed.  Patient does not require bone marrow biopsy.  Return to clinic in 6 months with repeat laboratory work and further evaluation.  If her  blood work remains stable at that time, she likely can be discharged from clinic. SVT: Continue monitoring and treatment per cardiology.  I spent a total of 20 minutes reviewing chart data, face-to-face evaluation with the patient, counseling and coordination of care as detailed above.   Patient expressed understanding and was in agreement with this plan. She also understands that She can call clinic at any time with any questions, concerns, or complaints.    Jeralyn Ruths, MD   07/02/2023 10:55 AM

## 2023-07-04 DIAGNOSIS — R002 Palpitations: Secondary | ICD-10-CM | POA: Diagnosis not present

## 2023-07-04 DIAGNOSIS — I471 Supraventricular tachycardia, unspecified: Secondary | ICD-10-CM | POA: Diagnosis not present

## 2023-07-05 DIAGNOSIS — M180 Bilateral primary osteoarthritis of first carpometacarpal joints: Secondary | ICD-10-CM | POA: Diagnosis not present

## 2023-07-05 LAB — INTELLIGEN MYELOID

## 2023-07-09 DIAGNOSIS — I471 Supraventricular tachycardia, unspecified: Secondary | ICD-10-CM | POA: Diagnosis not present

## 2023-07-09 DIAGNOSIS — R002 Palpitations: Secondary | ICD-10-CM | POA: Diagnosis not present

## 2023-07-24 DIAGNOSIS — R002 Palpitations: Secondary | ICD-10-CM | POA: Diagnosis not present

## 2023-07-26 DIAGNOSIS — M48062 Spinal stenosis, lumbar region with neurogenic claudication: Secondary | ICD-10-CM | POA: Diagnosis not present

## 2023-07-26 DIAGNOSIS — M5380 Other specified dorsopathies, site unspecified: Secondary | ICD-10-CM | POA: Diagnosis not present

## 2023-07-26 DIAGNOSIS — M62830 Muscle spasm of back: Secondary | ICD-10-CM | POA: Diagnosis not present

## 2023-07-26 DIAGNOSIS — M5416 Radiculopathy, lumbar region: Secondary | ICD-10-CM | POA: Diagnosis not present

## 2023-07-27 DIAGNOSIS — E538 Deficiency of other specified B group vitamins: Secondary | ICD-10-CM | POA: Diagnosis not present

## 2023-08-07 DIAGNOSIS — I10 Essential (primary) hypertension: Secondary | ICD-10-CM | POA: Diagnosis not present

## 2023-08-07 DIAGNOSIS — I471 Supraventricular tachycardia, unspecified: Secondary | ICD-10-CM | POA: Diagnosis not present

## 2023-08-07 DIAGNOSIS — E782 Mixed hyperlipidemia: Secondary | ICD-10-CM | POA: Diagnosis not present

## 2023-08-07 DIAGNOSIS — I071 Rheumatic tricuspid insufficiency: Secondary | ICD-10-CM | POA: Diagnosis not present

## 2023-08-23 ENCOUNTER — Encounter: Payer: Self-pay | Admitting: Internal Medicine

## 2023-08-23 ENCOUNTER — Inpatient Hospital Stay
Admission: EM | Admit: 2023-08-23 | Discharge: 2023-08-30 | DRG: 177 | Disposition: A | Discharge status: Home / Self Care | Source: Ambulatory Visit | Attending: Internal Medicine | Admitting: Internal Medicine

## 2023-08-23 ENCOUNTER — Emergency Department

## 2023-08-23 ENCOUNTER — Other Ambulatory Visit: Payer: Self-pay

## 2023-08-23 DIAGNOSIS — Z66 Do not resuscitate: Secondary | ICD-10-CM | POA: Diagnosis present

## 2023-08-23 DIAGNOSIS — G928 Other toxic encephalopathy: Secondary | ICD-10-CM | POA: Diagnosis not present

## 2023-08-23 DIAGNOSIS — A419 Sepsis, unspecified organism: Secondary | ICD-10-CM

## 2023-08-23 DIAGNOSIS — R0602 Shortness of breath: Secondary | ICD-10-CM | POA: Diagnosis not present

## 2023-08-23 DIAGNOSIS — D329 Benign neoplasm of meninges, unspecified: Secondary | ICD-10-CM | POA: Diagnosis present

## 2023-08-23 DIAGNOSIS — R042 Hemoptysis: Secondary | ICD-10-CM | POA: Diagnosis present

## 2023-08-23 DIAGNOSIS — J9811 Atelectasis: Secondary | ICD-10-CM | POA: Diagnosis not present

## 2023-08-23 DIAGNOSIS — Z9884 Bariatric surgery status: Secondary | ICD-10-CM | POA: Diagnosis not present

## 2023-08-23 DIAGNOSIS — Z8249 Family history of ischemic heart disease and other diseases of the circulatory system: Secondary | ICD-10-CM

## 2023-08-23 DIAGNOSIS — E785 Hyperlipidemia, unspecified: Secondary | ICD-10-CM | POA: Diagnosis present

## 2023-08-23 DIAGNOSIS — Z4682 Encounter for fitting and adjustment of non-vascular catheter: Secondary | ICD-10-CM | POA: Diagnosis not present

## 2023-08-23 DIAGNOSIS — G8929 Other chronic pain: Secondary | ICD-10-CM | POA: Diagnosis present

## 2023-08-23 DIAGNOSIS — R918 Other nonspecific abnormal finding of lung field: Secondary | ICD-10-CM | POA: Diagnosis not present

## 2023-08-23 DIAGNOSIS — J918 Pleural effusion in other conditions classified elsewhere: Secondary | ICD-10-CM | POA: Diagnosis present

## 2023-08-23 DIAGNOSIS — I700 Atherosclerosis of aorta: Secondary | ICD-10-CM | POA: Diagnosis not present

## 2023-08-23 DIAGNOSIS — R091 Pleurisy: Secondary | ICD-10-CM | POA: Diagnosis not present

## 2023-08-23 DIAGNOSIS — Z9889 Other specified postprocedural states: Secondary | ICD-10-CM

## 2023-08-23 DIAGNOSIS — J189 Pneumonia, unspecified organism: Secondary | ICD-10-CM | POA: Diagnosis present

## 2023-08-23 DIAGNOSIS — E039 Hypothyroidism, unspecified: Secondary | ICD-10-CM | POA: Diagnosis present

## 2023-08-23 DIAGNOSIS — Z79890 Hormone replacement therapy (postmenopausal): Secondary | ICD-10-CM

## 2023-08-23 DIAGNOSIS — K449 Diaphragmatic hernia without obstruction or gangrene: Secondary | ICD-10-CM | POA: Diagnosis present

## 2023-08-23 DIAGNOSIS — G9332 Myalgic encephalomyelitis/chronic fatigue syndrome: Secondary | ICD-10-CM | POA: Diagnosis present

## 2023-08-23 DIAGNOSIS — Z85828 Personal history of other malignant neoplasm of skin: Secondary | ICD-10-CM | POA: Diagnosis not present

## 2023-08-23 DIAGNOSIS — I471 Supraventricular tachycardia, unspecified: Secondary | ICD-10-CM | POA: Diagnosis present

## 2023-08-23 DIAGNOSIS — I10 Essential (primary) hypertension: Secondary | ICD-10-CM | POA: Diagnosis present

## 2023-08-23 DIAGNOSIS — G9389 Other specified disorders of brain: Secondary | ICD-10-CM | POA: Diagnosis not present

## 2023-08-23 DIAGNOSIS — E872 Acidosis, unspecified: Secondary | ICD-10-CM | POA: Diagnosis not present

## 2023-08-23 DIAGNOSIS — E890 Postprocedural hypothyroidism: Secondary | ICD-10-CM | POA: Diagnosis present

## 2023-08-23 DIAGNOSIS — Z90710 Acquired absence of both cervix and uterus: Secondary | ICD-10-CM | POA: Diagnosis not present

## 2023-08-23 DIAGNOSIS — F418 Other specified anxiety disorders: Secondary | ICD-10-CM | POA: Diagnosis present

## 2023-08-23 DIAGNOSIS — R0781 Pleurodynia: Secondary | ICD-10-CM | POA: Diagnosis not present

## 2023-08-23 DIAGNOSIS — J869 Pyothorax without fistula: Principal | ICD-10-CM | POA: Diagnosis present

## 2023-08-23 DIAGNOSIS — I771 Stricture of artery: Secondary | ICD-10-CM | POA: Diagnosis not present

## 2023-08-23 DIAGNOSIS — Z96653 Presence of artificial knee joint, bilateral: Secondary | ICD-10-CM | POA: Diagnosis present

## 2023-08-23 DIAGNOSIS — R9082 White matter disease, unspecified: Secondary | ICD-10-CM | POA: Diagnosis not present

## 2023-08-23 DIAGNOSIS — Z79899 Other long term (current) drug therapy: Secondary | ICD-10-CM

## 2023-08-23 DIAGNOSIS — E871 Hypo-osmolality and hyponatremia: Secondary | ICD-10-CM | POA: Diagnosis present

## 2023-08-23 DIAGNOSIS — J90 Pleural effusion, not elsewhere classified: Secondary | ICD-10-CM | POA: Diagnosis present

## 2023-08-23 DIAGNOSIS — K219 Gastro-esophageal reflux disease without esophagitis: Secondary | ICD-10-CM | POA: Diagnosis present

## 2023-08-23 DIAGNOSIS — D509 Iron deficiency anemia, unspecified: Secondary | ICD-10-CM | POA: Diagnosis present

## 2023-08-23 DIAGNOSIS — Z8673 Personal history of transient ischemic attack (TIA), and cerebral infarction without residual deficits: Secondary | ICD-10-CM

## 2023-08-23 DIAGNOSIS — R000 Tachycardia, unspecified: Secondary | ICD-10-CM | POA: Diagnosis not present

## 2023-08-23 DIAGNOSIS — R911 Solitary pulmonary nodule: Secondary | ICD-10-CM | POA: Diagnosis not present

## 2023-08-23 DIAGNOSIS — D75839 Thrombocytosis, unspecified: Secondary | ICD-10-CM | POA: Diagnosis not present

## 2023-08-23 DIAGNOSIS — Z7985 Long-term (current) use of injectable non-insulin antidiabetic drugs: Secondary | ICD-10-CM

## 2023-08-23 DIAGNOSIS — R0689 Other abnormalities of breathing: Secondary | ICD-10-CM | POA: Diagnosis not present

## 2023-08-23 DIAGNOSIS — J188 Other pneumonia, unspecified organism: Secondary | ICD-10-CM | POA: Diagnosis not present

## 2023-08-23 DIAGNOSIS — T361X5A Adverse effect of cephalosporins and other beta-lactam antibiotics, initial encounter: Secondary | ICD-10-CM | POA: Diagnosis not present

## 2023-08-23 DIAGNOSIS — Z136 Encounter for screening for cardiovascular disorders: Secondary | ICD-10-CM | POA: Diagnosis not present

## 2023-08-23 HISTORY — DX: Pneumonia, unspecified organism: J18.9

## 2023-08-23 LAB — CBC WITH DIFFERENTIAL/PLATELET
Abs Immature Granulocytes: 0.22 10*3/uL — ABNORMAL HIGH (ref 0.00–0.07)
Basophils Absolute: 0.1 10*3/uL (ref 0.0–0.1)
Basophils Relative: 0 %
Eosinophils Absolute: 0.3 10*3/uL (ref 0.0–0.5)
Eosinophils Relative: 1 %
HCT: 34.2 % — ABNORMAL LOW (ref 36.0–46.0)
Hemoglobin: 11.1 g/dL — ABNORMAL LOW (ref 12.0–15.0)
Immature Granulocytes: 1 %
Lymphocytes Relative: 4 %
Lymphs Abs: 0.9 10*3/uL (ref 0.7–4.0)
MCH: 30.5 pg (ref 26.0–34.0)
MCHC: 32.5 g/dL (ref 30.0–36.0)
MCV: 94 fL (ref 80.0–100.0)
Monocytes Absolute: 1.6 10*3/uL — ABNORMAL HIGH (ref 0.1–1.0)
Monocytes Relative: 7 %
Neutro Abs: 19.4 10*3/uL — ABNORMAL HIGH (ref 1.7–7.7)
Neutrophils Relative %: 87 %
Platelets: 501 10*3/uL — ABNORMAL HIGH (ref 150–400)
RBC: 3.64 MIL/uL — ABNORMAL LOW (ref 3.87–5.11)
RDW: 12.5 % (ref 11.5–15.5)
WBC: 22.4 10*3/uL — ABNORMAL HIGH (ref 4.0–10.5)
nRBC: 0 % (ref 0.0–0.2)

## 2023-08-23 LAB — LACTIC ACID, PLASMA
Lactic Acid, Venous: 1.2 mmol/L (ref 0.5–1.9)
Lactic Acid, Venous: 1 mmol/L (ref 0.5–1.9)

## 2023-08-23 LAB — RESP PANEL BY RT-PCR (RSV, FLU A&B, COVID)  RVPGX2
Influenza A by PCR: NEGATIVE
Influenza B by PCR: NEGATIVE
Resp Syncytial Virus by PCR: NEGATIVE
SARS Coronavirus 2 by RT PCR: NEGATIVE

## 2023-08-23 LAB — COMPREHENSIVE METABOLIC PANEL WITH GFR
ALT: 15 U/L (ref 0–44)
AST: 22 U/L (ref 15–41)
Albumin: 2.9 g/dL — ABNORMAL LOW (ref 3.5–5.0)
Alkaline Phosphatase: 90 U/L (ref 38–126)
Anion gap: 8 (ref 5–15)
BUN: 14 mg/dL (ref 8–23)
CO2: 21 mmol/L — ABNORMAL LOW (ref 22–32)
Calcium: 8.8 mg/dL — ABNORMAL LOW (ref 8.9–10.3)
Chloride: 103 mmol/L (ref 98–111)
Creatinine, Ser: 0.78 mg/dL (ref 0.44–1.00)
GFR, Estimated: >60 mL/min (ref >60)
Glucose, Bld: 102 mg/dL — ABNORMAL HIGH (ref 70–99)
Potassium: 3.9 mmol/L (ref 3.5–5.1)
Sodium: 132 mmol/L — ABNORMAL LOW (ref 135–145)
Total Bilirubin: 1 mg/dL (ref 0.0–1.2)
Total Protein: 7.1 g/dL (ref 6.5–8.1)

## 2023-08-23 LAB — LACTATE DEHYDROGENASE: LDH: 136 U/L (ref 98–192)

## 2023-08-23 LAB — PROTIME-INR
INR: 1.5 — ABNORMAL HIGH (ref 0.8–1.2)
Prothrombin Time: 18.6 s — ABNORMAL HIGH (ref 11.4–15.2)

## 2023-08-23 LAB — BRAIN NATRIURETIC PEPTIDE: B Natriuretic Peptide: 117.3 pg/mL — ABNORMAL HIGH (ref 0.0–100.0)

## 2023-08-23 LAB — TROPONIN I (HIGH SENSITIVITY): Troponin I (High Sensitivity): 6 ng/L (ref <18)

## 2023-08-23 LAB — PROCALCITONIN: Procalcitonin: 0.39 ng/mL

## 2023-08-23 LAB — APTT: aPTT: 47 s — ABNORMAL HIGH (ref 24–36)

## 2023-08-23 MED ORDER — SODIUM CHLORIDE 0.9 % IV SOLN
500.0000 mg | Freq: Once | INTRAVENOUS | Status: AC
  Administered 2023-08-23: 500 mg via INTRAVENOUS
  Filled 2023-08-23: qty 5

## 2023-08-23 MED ORDER — ADULT MULTIVITAMIN W/MINERALS CH
1.0000 | ORAL_TABLET | Freq: Every day | ORAL | Status: DC
Start: 2023-08-24 — End: 2023-08-30
  Administered 2023-08-24 – 2023-08-30 (×7): 1 via ORAL
  Filled 2023-08-23 (×7): qty 1

## 2023-08-23 MED ORDER — OXYCODONE-ACETAMINOPHEN 5-325 MG PO TABS
1.0000 | ORAL_TABLET | ORAL | Status: DC | PRN
  Administered 2023-08-23 – 2023-08-26 (×8): 1 via ORAL
  Filled 2023-08-23 (×8): qty 1

## 2023-08-23 MED ORDER — IOHEXOL 350 MG/ML SOLN
75.0000 mL | Freq: Once | INTRAVENOUS | Status: AC | PRN
  Administered 2023-08-23: 75 mL via INTRAVENOUS

## 2023-08-23 MED ORDER — OXYCODONE HCL 5 MG PO TABS
5.0000 mg | ORAL_TABLET | Freq: Once | ORAL | Status: AC
  Administered 2023-08-23: 5 mg via ORAL
  Filled 2023-08-23: qty 1

## 2023-08-23 MED ORDER — ONDANSETRON HCL 4 MG/2ML IJ SOLN
4.0000 mg | Freq: Three times a day (TID) | INTRAMUSCULAR | Status: DC | PRN
  Administered 2023-08-24: 4 mg via INTRAVENOUS
  Filled 2023-08-23: qty 2

## 2023-08-23 MED ORDER — VANCOMYCIN HCL 500 MG/100ML IV SOLN
500.0000 mg | Freq: Once | INTRAVENOUS | Status: AC
Start: 2023-08-23 — End: 2023-08-23
  Administered 2023-08-23: 500 mg via INTRAVENOUS
  Filled 2023-08-23 (×2): qty 100

## 2023-08-23 MED ORDER — QUETIAPINE FUMARATE 25 MG PO TABS
50.0000 mg | ORAL_TABLET | Freq: Every day | ORAL | Status: DC
  Administered 2023-08-24: 50 mg via ORAL
  Filled 2023-08-23 (×2): qty 2

## 2023-08-23 MED ORDER — GABAPENTIN 300 MG PO CAPS
300.0000 mg | ORAL_CAPSULE | Freq: Three times a day (TID) | ORAL | Status: DC
  Administered 2023-08-24 – 2023-08-26 (×7): 300 mg via ORAL
  Filled 2023-08-23 (×7): qty 1

## 2023-08-23 MED ORDER — SODIUM CHLORIDE 0.9 % IV SOLN
500.0000 mg | INTRAVENOUS | Status: DC
  Administered 2023-08-24 – 2023-08-27 (×4): 500 mg via INTRAVENOUS
  Filled 2023-08-23 (×4): qty 5

## 2023-08-23 MED ORDER — VITAMIN B-12 100 MCG PO TABS
300.0000 ug | ORAL_TABLET | Freq: Every day | ORAL | Status: DC
  Administered 2023-08-24 – 2023-08-30 (×7): 300 ug via ORAL
  Filled 2023-08-23 (×7): qty 3

## 2023-08-23 MED ORDER — HYDRALAZINE HCL 20 MG/ML IJ SOLN: 5.0000 mg | INTRAMUSCULAR | Status: DC | PRN

## 2023-08-23 MED ORDER — DM-GUAIFENESIN ER 30-600 MG PO TB12
1.0000 | ORAL_TABLET | Freq: Two times a day (BID) | ORAL | Status: DC | PRN
  Filled 2023-08-23: qty 1

## 2023-08-23 MED ORDER — PANTOPRAZOLE SODIUM 40 MG PO TBEC
40.0000 mg | DELAYED_RELEASE_TABLET | Freq: Every day | ORAL | Status: DC
  Administered 2023-08-24 – 2023-08-30 (×7): 40 mg via ORAL
  Filled 2023-08-23 (×7): qty 1

## 2023-08-23 MED ORDER — SODIUM CHLORIDE 0.9 % IV SOLN
2.0000 g | Freq: Two times a day (BID) | INTRAVENOUS | Status: DC
  Administered 2023-08-24 – 2023-08-28 (×9): 2 g via INTRAVENOUS
  Filled 2023-08-23 (×9): qty 12.5

## 2023-08-23 MED ORDER — SODIUM CHLORIDE 0.9 % IV SOLN
2.0000 g | Freq: Once | INTRAVENOUS | Status: AC
  Administered 2023-08-23: 2 g via INTRAVENOUS
  Filled 2023-08-23: qty 12.5

## 2023-08-23 MED ORDER — ROSUVASTATIN CALCIUM 10 MG PO TABS
10.0000 mg | ORAL_TABLET | Freq: Every day | ORAL | Status: DC
  Administered 2023-08-24 – 2023-08-30 (×7): 10 mg via ORAL
  Filled 2023-08-23 (×7): qty 1

## 2023-08-23 MED ORDER — LIOTHYRONINE SODIUM 5 MCG PO TABS
5.0000 ug | ORAL_TABLET | Freq: Every day | ORAL | Status: DC
  Administered 2023-08-24 – 2023-08-30 (×7): 5 ug via ORAL
  Filled 2023-08-23 (×7): qty 1

## 2023-08-23 MED ORDER — VANCOMYCIN HCL 1500 MG/300ML IV SOLN
1500.0000 mg | INTRAVENOUS | Status: DC
  Administered 2023-08-24: 1500 mg via INTRAVENOUS
  Filled 2023-08-23 (×2): qty 300

## 2023-08-23 MED ORDER — VANCOMYCIN HCL IN DEXTROSE 1-5 GM/200ML-% IV SOLN
1000.0000 mg | Freq: Once | INTRAVENOUS | Status: AC
  Administered 2023-08-23: 1000 mg via INTRAVENOUS
  Filled 2023-08-23: qty 200

## 2023-08-23 MED ORDER — ACETAMINOPHEN 325 MG PO TABS
650.0000 mg | ORAL_TABLET | Freq: Four times a day (QID) | ORAL | Status: DC | PRN
  Administered 2023-08-23 – 2023-08-29 (×6): 650 mg via ORAL
  Filled 2023-08-23 (×6): qty 2

## 2023-08-23 MED ORDER — BUPROPION HCL ER (XL) 300 MG PO TB24
300.0000 mg | ORAL_TABLET | Freq: Every day | ORAL | Status: DC
  Administered 2023-08-24 – 2023-08-30 (×7): 300 mg via ORAL
  Filled 2023-08-23 (×7): qty 1

## 2023-08-23 MED ORDER — CARVEDILOL 3.125 MG PO TABS
3.1250 mg | ORAL_TABLET | Freq: Two times a day (BID) | ORAL | Status: DC
  Administered 2023-08-24 – 2023-08-30 (×13): 3.125 mg via ORAL
  Filled 2023-08-23 (×12): qty 1

## 2023-08-23 MED ORDER — ALBUTEROL SULFATE (2.5 MG/3ML) 0.083% IN NEBU: 2.5000 mg | INHALATION_SOLUTION | RESPIRATORY_TRACT | Status: DC | PRN

## 2023-08-23 NOTE — ED Notes (Signed)
 This RN gave report to [employer] and performed care handoff. This RN introduced self to pt. Call light in reach, bed wheels locked, side rail raised, pt updated on plan of care. Rounding completed.

## 2023-08-23 NOTE — Consult Note (Signed)
 CODE SEPSIS - PHARMACY COMMUNICATION  **Broad Spectrum Antibiotics should be administered within 1 hour of Sepsis diagnosis**  Time Code Sepsis Called/Page Received: 1646  Antibiotics Ordered: Azithromycin, Cefepime, Vancomycin   Time of 1st antibiotic administration: 1754  Additional action taken by pharmacy: Messaged RN  If necessary, Name of Provider/Nurse Contacted: Cassie Marisa Sickles, PharmD Pharmacy Resident  08/23/2023 4:48 PM

## 2023-08-23 NOTE — ED Notes (Signed)
Pt in CT at this time in stable condition.

## 2023-08-23 NOTE — Progress Notes (Signed)
 Pharmacy Antibiotic Note  Barbara Mclaughlin is a 73 y.o. female admitted on 08/23/2023 with pneumonia, ?empyema. Pharmacy has been consulted for vancomycin and Cefepime dosing.  -per consult order: CAP w/ L pleural effusion, cannot r/o empyema  Plan: Patient received Cefepime 2 gm IV x 1 in ED. Will continue with Cefepime 2 gm IV q12h   Crcl 58.6 ml/min  Patient ordered Vancomycin 1 gram IV x 1 in ED.  Will add vancomycin 500mg  for a total of 1500mg  IV loading dose Will continue with Vancomycin 1500 mg IV Q 24 hrs. Goal AUC 400-550. Expected AUC: 577 Cmin 12.9 SCr used: 0.80   Continue to follow renal function, cultures, length of therapy   Height: 5\' 6"  (167.6 cm) Weight: 68 kg (150 lb) IBW/kg (Calculated) : 59.3  Temp (24hrs), Avg:98.3 F (36.8 C), Min:98.3 F (36.8 C), Max:98.3 F (36.8 C)  Recent Labs  Lab 08/23/23 1607 08/23/23 1745 08/23/23 1929  WBC 22.4*  --   --   CREATININE 0.78  --   --   LATICACIDVEN  --  1.2 1.0    Estimated Creatinine Clearance: 58.6 mL/min (by C-G formula based on SCr of 0.78 mg/dL).    Allergies  Allergen Reactions   Effexor [Venlafaxine]     agitation   Topamax [Topiramate]     Tingling in arms    Antimicrobials this admission: Azithromycin  4/17 >>   cefepime 4/17 >>   Vancomycin 4/17 >>  Dose adjustments this admission:    Microbiology results: 4/17 BCx: pending   UCx:    4/17 Sputum: pending  4/17 MRSA PCR: pending 4/17 chest CT:  Thank you for allowing pharmacy to be a part of this patient's care.  Thomasine Flick PharmD Clinical Pharmacist 08/23/2023

## 2023-08-23 NOTE — H&P (Signed)
 History and Physical    Barbara Mclaughlin VFI:[id number] DOB: Sep 22, 1950 DOA: 08/23/2023  Referring MD/NP/PA:   PCP: Yehuda Helms, MD   Patient coming from:  The patient is coming from home.     Chief Complaint: cough, SOB, chest pain.  HPI: Barbara Mclaughlin is a 73 y.o. female with medical history significant of HTN, HLD, post ablative hypothyroidism, depression with anxiety, anemia, SVT, acoustic neuroma, depression with anxiety, who presents with cough, SOB, chest pain.  Patient states she has dry cough for almost 5 weeks.  Mostly dry cough, but she has had 3 episodes of hemoptysis, 2 episodes with dark-colored blood and 1 episode with bright red blood last night.  She has mild SOB, no fever or chills.  She reports left-sided chest pain and rib pain, which is sharp, moderate, nonradiating, aggravated by coughing.  Patient does not have nausea, vomiting, diarrhea or abdominal pain.  No symptoms of UTI. Of note,  patient is scheduled for SVT ablation by her cardiologist on 4/29.  He was given prescription of prophylaxis amoxicillin .  He took 3 dose of amoxicillin  so far.  Patient was seen in [place] [facility] today, and had chest x-ray which showed large left-sided pleural effusion.  Patient is sent to ED for further evaluation treatment.  Data reviewed independently and ED Course: pt was found to have WBC 22.4, GFR> 60, BNP 117.3, lactic acid 1.2 --> 1.0, procalcitonin 0.39.  Temperature normal, blood pressure 122/91, heart rate 90-100, RR 23, oxygen saturation 95% on room air.  Patient is admitted to telemetry bed as inpatient.  CAT: 1. No evidence of arterial emboli or right heart strain. 2. Slight cardiomegaly with small pericardial effusion. Borderline prominent pulmonary veins. 3. Aortic and trace coronary artery atherosclerosis. 4. Moderate-sized left pleural effusion, mostly posterior or subpulmonic, with a small portion of the fluid loculated in the left apex. 5.  Small right pleural effusion without loculations. Atelectasis right lower lobe. 6. Bronchial thickening in the lower lobes with scattered subsegmental bronchial impaction in the left lower lobe, and consolidation or compressive collapse of the left lower lobe basal segments and dorsal lingula. 7. 1 x 0.8 cm right apical subpleural nodule containing an ectatic bronchiole. This may represent a nodular scar but is indeterminate. Per Fleischner Society Guidelines, consider a non-contrast Chest CT at 3 months, a PET/CT, or tissue sampling. These guidelines do not apply to immunocompromised patients and patients with cancer. Follow up in patients with significant comorbidities as clinically warranted. For lung cancer screening, adhere to Lung-RADS guidelines. Reference: Radiology. 2017; 284(1):228-43. 8. Two other tiny 3 mm nodules in the posterior segment of the right upper lobe. 9. Osteopenia, scoliosis and degenerative change.   Aortic Atherosclerosis (ICD10-I70.0).  Or   EKG: I have personally reviewed.  Sinus rhythm, QTc 447, LAE, borderline left axis deviation.   Review of Systems:   General: no fevers, chills, no body weight gain, has poor appetite, has fatigue HEENT: no blurry vision, hearing changes or sore throat Respiratory: has dyspnea, coughing, no wheezing CV: has chest pain, no palpitations GI: no nausea, vomiting, abdominal pain, diarrhea, constipation GU: no dysuria, burning on urination, increased urinary frequency, hematuria  Ext: no leg edema Neuro: no unilateral weakness, numbness, or tingling, no vision change or hearing loss Skin: no rash, no skin tear. MSK: No muscle spasm, no deformity, no limitation of range of movement in spin Heme: No easy bruising.  Travel history: No recent long distant travel.   Allergy:  Allergies  Allergen Reactions   Effexor  [Venlafaxine ]     agitation   Topamax [Topiramate]     Tingling in arms    Past Medical History:   Diagnosis Date   Acoustic neuroma (HCC)    right ear   Anemia    Arthritis    B12 deficiency    Basal cell carcinoma, eyelid    CAP (community acquired pneumonia) 08/23/2023   Chronic fatigue syndrome    Chronic pain 11/20/2011   Depression    Dysrhythmia 0ne episode of tachycardia on 10/03/14   GERD (gastroesophageal reflux disease)    Headache    Heart murmur bruit   Hypertension    Hyperthyroidism 01/05/2015   Osteoporosis    Palpitations    Postablative hypothyroidism 09/03/2015   SVT (supraventricular tachycardia) (HCC)    Thyroid  nodule    Vitamin D  deficiency     Past Surgical History:  Procedure Laterality Date   BREAST REDUCTION SURGERY  1990   COLONOSCOPY WITH PROPOFOL  N/A 11/22/2020   Procedure: COLONOSCOPY WITH PROPOFOL ;  Surgeon: Shane Darling, MD;  Location: [facility] ENDOSCOPY;  Service: Endoscopy;  Laterality: N/A;   ESOPHAGOGASTRODUODENOSCOPY  11/22/2020   Procedure: ESOPHAGOGASTRODUODENOSCOPY (EGD);  Surgeon: Shane Darling, MD;  Location: [facility] ENDOSCOPY;  Service: Endoscopy;;   EYE SURGERY Bilateral cataract removal   GASTRIC BYPASS     LAPAROSCOPIC HYSTERECTOMY  07/2009   REDUCTION MAMMAPLASTY Bilateral 20+ yrs ago   TOTAL KNEE ARTHROPLASTY  2013   Dr. Aubry Blase, left   TOTAL KNEE ARTHROPLASTY Right 06/2013    Social History:  reports that she has never smoked. She has never used smokeless tobacco. She reports that she does not drink alcohol and does not use drugs.  Family History:  Family History  Problem Relation Age of Onset   Hypertension Mother    Depression Mother    Obesity Mother    Alcoholism Father    Breast cancer Paternal Aunt      Prior to Admission medications   Medication Sig Start Date End Date Taking? Authorizing Provider  ALPRAZolam (XANAX) 0.25 MG tablet Take by mouth. Patient not taking: Reported on 07/02/2023 11/17/15   [provider]  bisacodyl (BISACODYL) 5 MG EC tablet Take 5 mg by mouth daily as needed.      [provider]  buPROPion  (WELLBUTRIN  XL) 300 MG 24 hr tablet  07/09/17   [provider]  carvedilol  (COREG ) 3.125 MG tablet Take by mouth. 05/14/17 07/02/23  [provider]  celecoxib  (CELEBREX ) 100 MG capsule TAKE ONE CAPSULE BY MOUTH TWICE DAILY 12/06/15   Thomes Flicker, MD  Cholecalciferol (VITAMIN D3) 2000 UNITS capsule Take 2,000 Units by mouth daily.    [provider]  Cyanocobalamin  (VITAMIN B-12) 250 MCG TABS Take 250 mcg by mouth daily. 02/12/20   Opalski, Bernardo Bridgeman, DO  docusate sodium  (COLACE) 100 MG capsule Take by mouth.    [provider]  esomeprazole  (NEXIUM ) 40 MG capsule Take 80 mg by mouth every morning. Patient not taking: Reported on 07/02/2023    [provider]  ferrous sulfate 325 (65 FE) MG tablet Take by mouth.    [provider]  fluocinonide  ointment (LIDEX ) 0.05 % Apply topically 2 (two) times daily. Patient not taking: Reported on 07/02/2023 09/06/12   Thomes Flicker, MD  FLUoxetine  (PROZAC ) 20 MG capsule TAKE TWO CAPSULES BY MOUTH IN THE MORNING AND TAKE ONE CAPSULE BY MOUTH IN THE EVENING Patient not taking: Reported on 07/02/2023 12/06/15  Thomes Flicker, MD  levothyroxine  (SYNTHROID , LEVOTHROID) 25 MCG tablet Take 75 mcg by mouth.  09/03/15 07/02/23  [provider]  liothyronine  (CYTOMEL ) 5 MCG tablet Take by mouth. 11/23/22 02/05/24  [provider]  loratadine (CLARITIN) 10 MG tablet Take 10 mg by mouth daily.    [provider]  losartan  (COZAAR ) 50 MG tablet TAKE ONE TABLET BY MOUTH ONCE DAILY Patient not taking: Reported on 07/02/2023 06/29/15   Thomes Flicker, MD  Multiple Vitamin (MULTIVITAMIN) capsule Take 1 capsule by mouth daily.    [provider]  oxyCODONE -acetaminophen  (PERCOCET/ROXICET) 5-325 MG tablet Take by mouth. Patient not taking: Reported on 07/02/2023 09/15/14   [provider]  predniSONE (DELTASONE) 10 MG tablet Taper  6-5-4-3-2-1-off Patient not taking: Reported on 07/02/2023 06/08/17   [provider]  QUEtiapine  (SEROQUEL ) 50 MG tablet Take by mouth.    [provider]  ranitidine (ZANTAC) 150 MG tablet Take by mouth. Patient not taking: Reported on 07/02/2023    [provider]  rosuvastatin  (CRESTOR ) 10 MG tablet Take by mouth. 09/20/22 09/20/23  [provider]  Semaglutide, 1 MG/DOSE, 4 MG/3ML SOPN Inject into the skin. 05/07/23   [provider]  topiramate (TOPAMAX) 50 MG tablet Take 50 mg by mouth 2 (two) times daily. 11/15/21   [provider]  valsartan (DIOVAN) 160 MG tablet Take 1 tablet by mouth 2 (two) times daily. Patient not taking: Reported on 07/02/2023 04/04/21   [provider]  Vitamin D , Ergocalciferol , (DRISDOL ) 1.25 MG (50000 UNIT) CAPS capsule Take 1 capsule (50,000 Units total) by mouth every 7 (seven) days. 02/12/20   Marceil Sensor, DO    Physical Exam: Vitals:   08/23/23 1853 08/23/23 2000 08/23/23 2030 08/23/23 2105  BP:  133/74 135/74 123/70  Pulse: 94 (!) 102 (!) 106 97  Resp: (!) 22 (!) 28 20 18   Temp:    (!) 102.4 F (39.1 C)  TempSrc:    Oral  SpO2: 95% 98% 95% 96%  Weight:      Height:       General: Not in acute distress HEENT:       Eyes: PERRL, EOMI, no jaundice       ENT: No discharge from the ears and nose, no pharynx injection, no tonsillar enlargement.        Neck: No JVD, no bruit, no mass felt. Heme: No neck lymph node enlargement. Cardiac: S1/S2, RRR, No murmurs, No gallops or rubs. Respiratory: has decreased air movement on the left side GI: Soft, nondistended, nontender, no rebound pain, no organomegaly, BS present. GU: No hematuria Ext: No pitting leg edema bilaterally. 1+DP/PT pulse bilaterally. Musculoskeletal: No joint deformities, No joint redness or warmth, no limitation of ROM in spin. Skin: No rashes.  Neuro: Alert, oriented X3, cranial nerves II-XII grossly intact, moves all  extremities normally.  Psych: Patient is not psychotic, no suicidal or hemocidal ideation.  Labs on Admission: I have personally reviewed following labs and imaging studies  CBC: Recent Labs  Lab 08/23/23 1607  WBC 22.4*  NEUTROABS 19.4*  HGB 11.1*  HCT 34.2*  MCV 94.0  PLT 501*   Basic Metabolic Panel: Recent Labs  Lab 08/23/23 1607  NA 132*  K 3.9  CL 103  CO2 21*  GLUCOSE 102*  BUN 14  CREATININE 0.78  CALCIUM  8.8*   GFR: Estimated Creatinine Clearance: 58.6 mL/min (by C-G formula based on SCr of 0.78 mg/dL). Liver Function Tests: Recent Labs  Lab 08/23/23 1607  AST 22  ALT 15  ALKPHOS 90  BILITOT 1.0  PROT 7.1  ALBUMIN 2.9*   No results for input(s): "LIPASE", "AMYLASE" in the last 168 hours. No results for input(s): "AMMONIA" in the last 168 hours. Coagulation Profile: Recent Labs  Lab 08/23/23 2120  INR 1.5*   Cardiac Enzymes: No results for input(s): "CKTOTAL", "CKMB", "CKMBINDEX", "TROPONINI" in the last 168 hours. BNP (last 3 results) No results for input(s): "PROBNP" in the last 8760 hours. HbA1C: No results for input(s): "HGBA1C" in the last 72 hours. CBG: No results for input(s): "GLUCAP" in the last 168 hours. Lipid Profile: No results for input(s): "CHOL", "HDL", "LDLCALC", "TRIG", "CHOLHDL", "LDLDIRECT" in the last 72 hours. Thyroid  Function Tests: No results for input(s): "TSH", "T4TOTAL", "FREET4", "T3FREE", "THYROIDAB" in the last 72 hours. Anemia Panel: No results for input(s): "VITAMINB12", "FOLATE", "FERRITIN", "TIBC", "IRON", "RETICCTPCT" in the last 72 hours. Urine analysis:    Component Value Date/Time   COLORURINE Yellow 06/19/2013 1120   APPEARANCEUR Hazy 06/19/2013 1120   LABSPEC 1.013 06/19/2013 1120   PHURINE 6.0 06/19/2013 1120   GLUCOSEU Negative 06/19/2013 1120   HGBUR Negative 06/19/2013 1120   BILIRUBINUR Negative 06/19/2013 1120   KETONESUR Negative 06/19/2013 1120   PROTEINUR Negative 06/19/2013 1120    NITRITE Negative 06/19/2013 1120   LEUKOCYTESUR Negative 06/19/2013 1120   Sepsis Labs: @LABRCNTIP (procalcitonin:4,lacticidven:4) ) Recent Results (from the past 240 hours)  Resp panel by RT-PCR (RSV, Flu A&B, Covid) Anterior Nasal Swab     Status: None   Collection Time: 08/23/23  8:45 PM   Specimen: Anterior Nasal Swab  Result Value Ref Range Status   SARS Coronavirus 2 by RT PCR NEGATIVE NEGATIVE Final    Comment: (NOTE) SARS-CoV-2 target nucleic acids are NOT DETECTED.  The SARS-CoV-2 RNA is generally detectable in upper respiratory specimens during the acute phase of infection. The lowest concentration of SARS-CoV-2 viral copies this assay can detect is 138 copies/mL. A negative result does not preclude SARS-Cov-2 infection and should not be used as the sole basis for treatment or other patient management decisions. A negative result may occur with  improper specimen collection/handling, submission of specimen other than nasopharyngeal swab, presence of viral mutation(s) within the areas targeted by this assay, and inadequate number of viral copies(<138 copies/mL). A negative result must be combined with clinical observations, patient history, and epidemiological information. The expected result is Negative.  Fact Sheet for Patients:  BloggerCourse.com  Fact Sheet for Healthcare Providers:  SeriousBroker.it  This test is no t yet approved or cleared by the [place]  FDA and  has been authorized for detection and/or diagnosis of SARS-CoV-2 by FDA under an Emergency Use Authorization (EUA). This EUA will remain  in effect (meaning this test can be used) for the duration of the COVID-19 declaration under Section 564(b)(1) of the Act, 21 U.S.C.section 360bbb-3(b)(1), unless the authorization is terminated  or revoked sooner.       Influenza A by PCR NEGATIVE NEGATIVE Final   Influenza B by PCR NEGATIVE NEGATIVE Final     Comment: (NOTE) The Xpert Xpress SARS-CoV-2/FLU/RSV plus assay is intended as an aid in the diagnosis of influenza from Nasopharyngeal swab specimens and should not be used as a sole basis for treatment. Nasal washings and aspirates are unacceptable for Xpert Xpress SARS-CoV-2/FLU/RSV testing.  Fact Sheet for Patients: BloggerCourse.com  Fact Sheet for Healthcare Providers: SeriousBroker.it  This test is not yet approved or cleared by the [place]   FDA and has been authorized for detection and/or diagnosis of SARS-CoV-2 by FDA under an Emergency Use Authorization (EUA). This EUA will remain in effect (meaning this test can be used) for the duration of the COVID-19 declaration under Section 564(b)(1) of the Act, 21 U.S.C. section 360bbb-3(b)(1), unless the authorization is terminated or revoked.     Resp Syncytial Virus by PCR NEGATIVE NEGATIVE Final    Comment: (NOTE) Fact Sheet for Patients: BloggerCourse.com  Fact Sheet for Healthcare Providers: SeriousBroker.it  This test is not yet approved or cleared by the [place]  FDA and has been authorized for detection and/or diagnosis of SARS-CoV-2 by FDA under an Emergency Use Authorization (EUA). This EUA will remain in effect (meaning this test can be used) for the duration of the COVID-19 declaration under Section 564(b)(1) of the Act, 21 U.S.C. section 360bbb-3(b)(1), unless the authorization is terminated or revoked.  Performed at [facility], [address] [place], [place] [address]      Radiological Exams on Admission:   Assessment/Plan Principal Problem:   Pleural effusion Active Problems:   CAP (community acquired pneumonia)   Lung nodule   Hypertension   SVT (supraventricular tachycardia) (HCC)   Hypothyroidism   HLD (hyperlipidemia)   Anemia, iron deficiency   Depression with  anxiety   Assessment and Plan:   Pleural effusion and possible CAP: Patient has bilateral pleural effusion, with moderate size of left pleural effusion and small size of right pleural effusion.  Etiology is not clear, potential differential diagnosis include malignancy versus empyema.  Patient has hemoptysis, CT negative for PE.  CTA also showed possible consolidation in LLL, indicating possible pneumonia. WBC 22.4, procalcitonin 0.39.  Lactic acid normal, no fever.  Does not seem to have sepsis.  Will start on antibiotics empirically.  - Will admit to tele bed as inpt - IV Vancomycin  and cefepime , azithromycin  - Mucinex  for cough  - Bronchodilators - Urine legionella and S. pneumococcal antigen - Follow up blood culture x2, sputum culture - Check RESP panel - ordered thoracentesis to be done by IR  Lung nodule: as showed by CTA. -need to r/u with PCP for repeating CT of chest  Hypertension: - IV hydralazine  as needed - Coreg   SVT (supraventricular tachycardia) (HCC): Heart rate 90-100s. -Coreg  - Patient is scheduled for SVT ablation by her cardiologist on 4/29  Hypothyroidism -Liothyronine   HLD (hyperlipidemia) -Crestor   Anemia, iron deficiency: Hemoglobin stable 11.1 (10.0 on 06/18/2023).  Patient is not taking iron supplement currently. -Follow-up with CBC  Depression with anxiety - Continue home medications      DVT ppx: SCD  Code Status:  DNR (I discussed with patient in the presence of her daughter and husbnd, and explained the meaning of CODE STATUS. Patient wants to be DNR)  Family Communication: Yes, patient's daughter and husband at bed side.     Disposition Plan:  Anticipate discharge back to previous environment  Consults called:  none  Admission status and Level of care: Telemetry Medical:  as inpt        Dispo: The patient is from: Home              Anticipated d/c is to: Home              Anticipated d/c date is: 2 days              Patient  currently is not medically stable to d/c.    Severity of Illness:  The appropriate patient status  for this patient is INPATIENT. Inpatient status is judged to be reasonable and necessary in order to provide the required intensity of service to ensure the patient's safety. The patient's presenting symptoms, physical exam findings, and initial radiographic and laboratory data in the context of their chronic comorbidities is felt to place them at high risk for further clinical deterioration. Furthermore, it is not anticipated that the patient will be medically stable for discharge from the hospital within 2 midnights of admission.   * I certify that at the point of admission it is my clinical judgment that the patient will require inpatient hospital care spanning beyond 2 midnights from the point of admission due to high intensity of service, high risk for further deterioration and high frequency of surveillance required.*       Date of Service 08/24/2023    Fidencio Hue [facility]   If 7PM-7AM, please contact night-coverage [email or web address] 08/24/2023, 12:41 AM Aspirin 

## 2023-08-23 NOTE — ED Triage Notes (Signed)
 Pt c/o non-productive cough x5 weeks. Pt says a week ago she coughed up a little blood and again last night. Pt c/o pleuritic L rib pain. Pt denies fevers and sinus pressure. Pt denies any swelling to lower extremities.

## 2023-08-23 NOTE — ED Triage Notes (Signed)
 First Nurse Note: Patient to ED via [facility] for non-productive cough x2 weeks with rib pain, SOB and CP with movement. X-ray showed effusion on left side.

## 2023-08-23 NOTE — ED Provider Notes (Signed)
 [facility] Provider Note    Event Date/Time   First MD Initiated Contact with Patient 08/23/23 1629     (approximate)   History   Cough   HPI  Barbara Mclaughlin is a 73 y.o. female who comes in from [facility] clinic due to cough.  Patient is had a cough for the past 5 weeks she is having intermittent coughing up blood and associate with some shortness of breath.  Patient had an x-ray done that showed large pleural effusion therefore sent over here for further evaluation.  She denies ever having anything like this happen before.  She is not on any blood thinners.  Patient does have a history of hypertension  Physical Exam   Triage Vital Signs: ED Triage Vitals [08/23/23 1605]  Encounter Vitals Group     BP (!) 142/89     Systolic BP Percentile      Diastolic BP Percentile      Pulse Rate 93     Resp 19     Temp 98.3 F (36.8 C)     Temp Source Oral     SpO2 94 %     Weight 150 lb (68 kg)     Height 5\' 6"  (1.676 m)     Head Circumference      Peak Flow      Pain Score 6     Pain Loc      Pain Education      Exclude from Growth Chart     Most recent vital signs: Vitals:   08/23/23 1605  BP: (!) 142/89  Pulse: 93  Resp: 19  Temp: 98.3 F (36.8 C)  SpO2: 94%     General: Awake, no distress.  CV:  Good peripheral perfusion.  Resp:  Normal effort.  Abd:  No distention.  Other:  Decreased breath sounds on the left   ED Results / Procedures / Treatments   Labs (all labs ordered are listed, but only abnormal results are displayed) Labs Reviewed  CBC WITH DIFFERENTIAL/PLATELET - Abnormal; Notable for the following components:      Result Value   WBC 22.4 (*)    RBC 3.64 (*)    Hemoglobin 11.1 (*)    HCT 34.2 (*)    Platelets 501 (*)    Neutro Abs 19.4 (*)    Monocytes Absolute 1.6 (*)    Abs Immature Granulocytes 0.22 (*)    All other components within normal limits  COMPREHENSIVE METABOLIC PANEL WITH GFR - Abnormal; Notable for  the following components:   Sodium 132 (*)    CO2 21 (*)    Glucose, Bld 102 (*)    Calcium 8.8 (*)    Albumin 2.9 (*)    All other components within normal limits  PROCALCITONIN     EKG  My interpretation of EKG:  Normal sinus rhythm 92 without any ST elevation or T wave inversions, normal intervals  RADIOLOGY I have reviewed the CT personally and interpreted positive effusion noted on left    PROCEDURES:  Critical Care performed: Yes, see critical care procedure note(s)  .1-3 Lead EKG Interpretation  Performed by: Concha Se, MD Authorized by: Concha Se, MD     Interpretation: normal     ECG rate:  90   ECG rate assessment: normal     Rhythm: sinus rhythm     Ectopy: none     Conduction: normal   .Critical Care  Performed by:  Lubertha Rush, MD Authorized by: Lubertha Rush, MD   Critical care provider statement:    Critical care time (minutes):  30   Critical care was necessary to treat or prevent imminent or life-threatening deterioration of the following conditions:  Sepsis   Critical care was time spent personally by me on the following activities:  Development of treatment plan with patient or surrogate, discussions with consultants, evaluation of patient's response to treatment, examination of patient, ordering and review of laboratory studies, ordering and review of radiographic studies, ordering and performing treatments and interventions, pulse oximetry, re-evaluation of patient's condition and review of old charts    MEDICATIONS ORDERED IN ED: Medications  vancomycin (VANCOCIN) IVPB 1000 mg/200 mL premix (has no administration in time range)  albuterol (PROVENTIL) (2.5 MG/3ML) 0.083% nebulizer solution 2.5 mg (has no administration in time range)  dextromethorphan-guaiFENesin (MUCINEX DM) 30-600 MG per 12 hr tablet 1 tablet (has no administration in time range)  ondansetron (ZOFRAN) injection 4 mg (has no administration in time range)  hydrALAZINE  (APRESOLINE) injection 5 mg (has no administration in time range)  acetaminophen (TYLENOL) tablet 650 mg (has no administration in time range)  oxyCODONE-acetaminophen (PERCOCET/ROXICET) 5-325 MG per tablet 1 tablet (has no administration in time range)  azithromycin (ZITHROMAX) 500 mg in sodium chloride 0.9 % 250 mL IVPB (has no administration in time range)  ceFEPIme (MAXIPIME) 2 g in sodium chloride 0.9 % 100 mL IVPB (has no administration in time range)  ceFEPIme (MAXIPIME) 2 g in sodium chloride 0.9 % 100 mL IVPB (0 g Intravenous Stopped 08/23/23 1825)  azithromycin (ZITHROMAX) 500 mg in sodium chloride 0.9 % 250 mL IVPB (500 mg Intravenous New Bag/Given 08/23/23 1826)  oxyCODONE (Oxy IR/ROXICODONE) immediate release tablet 5 mg (5 mg Oral Given 08/23/23 1732)  iohexol (OMNIPAQUE) 350 MG/ML injection 75 mL (75 mLs Intravenous Contrast Given 08/23/23 1845)     IMPRESSION / MDM / ASSESSMENT AND PLAN / ED COURSE  I reviewed the triage vital signs and the nursing notes.   Patient's presentation is most consistent with acute presentation with potential threat to life or bodily function.  Patient comes in with known pleural effusion on the left.  Will get CT imaging to evaluate for any abscess, PE or other acute pathology.  CMP is reassuring.  CBC shows elevated white count of 22,000  Patient meets sepsis with elevated white count elevated respiratory rate hold off on fluids due to unclear origin of this effusion patient not hypotensive or lactate is normal and BNP is elevated.  Will discuss with hospital team for admission CT scan is still pending   The patient is on the cardiac monitor to evaluate for evidence of arrhythmia and/or significant heart rate changes.      FINAL CLINICAL IMPRESSION(S) / ED DIAGNOSES   Final diagnoses:  Pleural effusion  Sepsis, due to unspecified organism, unspecified whether acute organ dysfunction present [facility])     Rx / DC Orders   ED Discharge  Orders     None        Note:  This document was prepared using Dragon voice recognition software and may include unintentional dictation errors.   Lubertha Rush, MD 08/23/23 2031

## 2023-08-24 ENCOUNTER — Inpatient Hospital Stay: Admitting: Radiology

## 2023-08-24 ENCOUNTER — Inpatient Hospital Stay

## 2023-08-24 DIAGNOSIS — J90 Pleural effusion, not elsewhere classified: Secondary | ICD-10-CM | POA: Diagnosis not present

## 2023-08-24 DIAGNOSIS — R042 Hemoptysis: Secondary | ICD-10-CM | POA: Diagnosis not present

## 2023-08-24 DIAGNOSIS — K449 Diaphragmatic hernia without obstruction or gangrene: Secondary | ICD-10-CM

## 2023-08-24 DIAGNOSIS — J189 Pneumonia, unspecified organism: Secondary | ICD-10-CM | POA: Diagnosis not present

## 2023-08-24 DIAGNOSIS — R911 Solitary pulmonary nodule: Secondary | ICD-10-CM

## 2023-08-24 DIAGNOSIS — E890 Postprocedural hypothyroidism: Secondary | ICD-10-CM | POA: Diagnosis not present

## 2023-08-24 DIAGNOSIS — I10 Essential (primary) hypertension: Secondary | ICD-10-CM | POA: Diagnosis not present

## 2023-08-24 HISTORY — PX: IR PERC PLEURAL DRAIN W/INDWELL CATH W/IMG GUIDE: IMG5383

## 2023-08-24 LAB — BASIC METABOLIC PANEL WITH GFR
Anion gap: 9 (ref 5–15)
BUN: 13 mg/dL (ref 8–23)
CO2: 20 mmol/L — ABNORMAL LOW (ref 22–32)
Calcium: 8.4 mg/dL — ABNORMAL LOW (ref 8.9–10.3)
Chloride: 103 mmol/L (ref 98–111)
Creatinine, Ser: 0.73 mg/dL (ref 0.44–1.00)
GFR, Estimated: >60 mL/min (ref >60)
Glucose, Bld: 99 mg/dL (ref 70–99)
Potassium: 3.4 mmol/L — ABNORMAL LOW (ref 3.5–5.1)
Sodium: 132 mmol/L — ABNORMAL LOW (ref 135–145)

## 2023-08-24 LAB — LACTATE DEHYDROGENASE, PLEURAL OR PERITONEAL FLUID: LD, Fluid: 1148 U/L — ABNORMAL HIGH (ref 3–23)

## 2023-08-24 LAB — BODY FLUID CELL COUNT WITH DIFFERENTIAL
Eos, Fluid: 10 %
Lymphs, Fluid: 2 %
Monocyte-Macrophage-Serous Fluid: 9 %
Neutrophil Count, Fluid: 79 %
Total Nucleated Cell Count, Fluid: 9756 uL

## 2023-08-24 LAB — CBC
HCT: 28.2 % — ABNORMAL LOW (ref 36.0–46.0)
Hemoglobin: 9.6 g/dL — ABNORMAL LOW (ref 12.0–15.0)
MCH: 30.5 pg (ref 26.0–34.0)
MCHC: 34 g/dL (ref 30.0–36.0)
MCV: 89.5 fL (ref 80.0–100.0)
Platelets: 424 10*3/uL — ABNORMAL HIGH (ref 150–400)
RBC: 3.15 MIL/uL — ABNORMAL LOW (ref 3.87–5.11)
RDW: 12.6 % (ref 11.5–15.5)
WBC: 24.4 10*3/uL — ABNORMAL HIGH (ref 4.0–10.5)
nRBC: 0 % (ref 0.0–0.2)

## 2023-08-24 LAB — PROTEIN, PLEURAL OR PERITONEAL FLUID: Total protein, fluid: 3.8 g/dL

## 2023-08-24 LAB — ALBUMIN, PLEURAL OR PERITONEAL FLUID: Albumin, Fluid: 1.7 g/dL

## 2023-08-24 MED ORDER — LEVOTHYROXINE SODIUM 50 MCG PO TABS
75.0000 ug | ORAL_TABLET | Freq: Every day | ORAL | Status: DC
  Administered 2023-08-25 – 2023-08-30 (×6): 75 ug via ORAL
  Filled 2023-08-24 (×6): qty 1

## 2023-08-24 MED ORDER — LIDOCAINE HCL 1 % IJ SOLN
20.0000 mL | Freq: Once | INTRAMUSCULAR | Status: AC
  Administered 2023-08-24: 20 mL via INTRADERMAL
  Filled 2023-08-24: qty 20

## 2023-08-24 MED ORDER — FENTANYL CITRATE (PF) 100 MCG/2ML IJ SOLN
INTRAMUSCULAR | Status: AC | PRN
  Administered 2023-08-24: 50 ug via INTRAVENOUS
  Administered 2023-08-24 (×2): 25 ug via INTRAVENOUS

## 2023-08-24 MED ORDER — ACETAMINOPHEN 325 MG PO TABS
ORAL_TABLET | ORAL | Status: AC
  Filled 2023-08-24: qty 2

## 2023-08-24 MED ORDER — MIDAZOLAM HCL 2 MG/2ML IJ SOLN
INTRAMUSCULAR | Status: AC
  Filled 2023-08-24: qty 4

## 2023-08-24 MED ORDER — MIDAZOLAM HCL 2 MG/2ML IJ SOLN
INTRAMUSCULAR | Status: AC | PRN
  Administered 2023-08-24 (×2): .5 mg via INTRAVENOUS
  Administered 2023-08-24: 1 mg via INTRAVENOUS

## 2023-08-24 MED ORDER — FENTANYL CITRATE (PF) 100 MCG/2ML IJ SOLN
INTRAMUSCULAR | Status: AC
  Filled 2023-08-24: qty 4

## 2023-08-24 MED ORDER — LIDOCAINE HCL 1 % IJ SOLN
INTRAMUSCULAR | Status: AC
  Filled 2023-08-24: qty 20

## 2023-08-24 NOTE — Procedures (Signed)
 Interventional Radiology Procedure:   Indications: Loculated left pleural effusion  Procedure: Image guided placement of left chest tube  Findings: Loculated left pleural effusion.  Placed 14 Fr drain and removed straw colored fluid for labs.  Attached to chest tube drainage system.  Complications: None     EBL: Minimal  Plan: Chest tube management per pulmonary service.   Katye Valek R. Julietta Ogren, MD  Pager: [phone]

## 2023-08-24 NOTE — Consult Note (Addendum)
   NAME:  Barbara Mclaughlin, MRN:  [id number], DOB:  08/12/1950, LOS: 1 ADMISSION DATE:  08/23/2023, CONSULTATION DATE:  08/24/2023  History of Present Illness:  This is a case of a pleasant 73 year old female patient with a past medical history of hypertension, hyperlipidemia, postablative hypothyroidism, depression with anxiety, anemia who is presenting to [facility] on 0/17 with ongoing cough shortness of breath and pleuritic chest pain.  She reports to me that about 5 weeks ago she started complaining of shortness of breath associated with cough and few episodes of hemoptysis.  She did not have hemoptysis while in house.  She also developed pleuritic chest pain.  On arrival she was hemodynamically stable elevated WBC count was noted at 22,000. CTA chest revealed a moderate to large left loculated pleural effusion. No PE.   Family history - Parents with COPD who were both smokers   Social history - Never Smoker   Pertinent  Medical History  As above  Objective   Blood pressure (!) 144/77, pulse 75, temperature (!) 97.5 F (36.4 C), temperature source Oral, resp. rate 17, height 5\' 6"  (1.676 m), weight 68 kg, SpO2 94%.       No intake or output data in the 24 hours ending 08/24/23 1513 Filed Weights   08/23/23 1605  Weight: 68 kg    Examination: General: NAD HENT: Supple neck reactive pupils  Lungs: Diminished at the left base  Cardiovascular: Normal S1, Normal S2, RRR  Abdomen: Soft, non tender, non distended, +BS  Extremities: Warm and well perfused  US  chest at bedside with complex left large pleural effusion with septations noted.   Assessment & Plan:  This is a case of a pleasant 73 year old female patient with a past medical history of hypertension, hyperlipidemia, postablative hypothyroidism, depression with anxiety, anemia who is presenting to [facility] on 0/17 with ongoing cough shortness of breath and pleuritic chest pain. Found to have a large complex left sided pleural  effusion.   #Left complex pleural effusion with septations highly suspicious for empyema (Elevated wbc count, cough with sputum production and pleuritic chest pain).  #Hemoptysis likely in the setting of CAP vs aspiration pneumonia in the setting of  #Large hiatal hernia   []  CT placement by IR, please place to suction at - 20cmH2O. Will need flushing orders Q6H.  []  Will decided on intrapleural lytics in the am.  []  Daily CXR []  Fluid to be sent for Body fluid cell count, LDH, TP, Albumin, gram stain and culture and cytology.  []  Would recommend expanding Abx to cover anaerobes.  []  Will decide on bronchoscopy based oin the above results. No signs of hemoptysis in house , H&H stable and no sign of endobronchial lesion on CT chest.   Thank you for the interesting consult.  Pulmonary Team will follow.   I spent 80 minutes caring for this patient today, including preparing to see the patient, obtaining a medical history , reviewing a separately obtained history, performing a medically appropriate examination and/or evaluation, counseling and educating the patient/family/caregiver, documenting clinical information in the electronic health record, and independently interpreting results (not separately reported/billed) and communicating results to the patient/family/caregiver  Annitta Kindler, MD [facility] 08/24/2023 3:20 PM

## 2023-08-24 NOTE — Progress Notes (Signed)
 PROGRESS NOTE    Barbara Mclaughlin  WJX:[id number] DOB: 1950/07/03 DOA: 08/23/2023 PCP: Yehuda Helms, MD  Chief Complaint  Patient presents with   Cough    Hospital Course:  Barbara Mclaughlin is 73 y.o. female with hypertension, hyperlipidemia, postablative hypothyroidism, depression, anxiety, anemia, history of SVT, acoustic neuroma, who presents with cough, shortness of breath, chest pain.  Patient reports persistent dry cough for the last 5 weeks but recently began having hemoptysis..  Patient is scheduled for an outpatient SVT ablation by her cardiologist on 4/29.  He has given her prescription of prophylaxis amoxicillin  which she took for 3 days prior to arrival.  Patient was seen at [facility] clinic on 4/17 and had chest x-ray performed which revealed large left-sided pleural effusion and she was sent to ED for further evaluation.  In the ED she was found have a leukocytosis of 22.4, BNP 117, lactic acid 1.2, Pro-Cal 0.39.  Vital signs were stable.  O2 sat 95% on room air.  CTA was performed which revealed no pulmonary embolism.  CT did reveal cardiomegaly with small pericardial effusion, moderately sized left pleural effusion mostly posterior or subpulmonic with a small portion of fluid in the left apex.  Also revealed a right pleural effusion without loculations and bronchial thickening in the lower lobes with scattered subsegmental bronchial impaction of left lower lobe and consolidation or compressive collapse of the left lower lobe basal segments and dorsal lingula.  A 1 x 0.8 cm right apical subpleural nodule containing ectatic bronchial was also noted as well as 3 other tiny nodules in the posterior segment of the right upper lobe.  She was admitted and initiated on antibiotics.    Subjective: On evaluation patient reports some chest pain that comes and goes but is currently feeling okay.  No nausea, no vomiting, no coughing at present.  Initially planned for thoracentesis this  morning but with further evaluation with pulmonology we will proceed with chest tube placement this afternoon.  Discussed procedure and expected outcomes with patient as well as her family at bedside   Objective: Vitals:   08/23/23 2030 08/23/23 2105 08/24/23 0134 08/24/23 0445  BP: 135/74 123/70 107/64 104/64  Pulse: (!) 106 97 83 78  Resp: 20 18 18 18   Temp:  (!) 102.4 F (39.1 C) 97.8 F (36.6 C) 97.7 F (36.5 C)  TempSrc:  Oral    SpO2: 95% 96% 97% 98%  Weight:      Height:       No intake or output data in the 24 hours ending 08/24/23 0728 Filed Weights   08/23/23 1605  Weight: 68 kg    Examination: General exam: Appears calm and comfortable, NAD  Respiratory system: No work of breathing, decreased aeration left side Cardiovascular system: S1 & S2 heard, RRR.  Gastrointestinal system: Abdomen is nondistended, soft and nontender.  Neuro: Alert and oriented. No focal neurological deficits. Extremities: Symmetric, expected ROM Skin: No rashes, lesions Psychiatry: Demonstrates appropriate judgement and insight. Mood & affect appropriate for situation.   Assessment & Plan:  Principal Problem:   Pleural effusion Active Problems:   CAP (community acquired pneumonia)   Lung nodule   Hypertension   SVT (supraventricular tachycardia) (HCC)   Hypothyroidism   HLD (hyperlipidemia)   Anemia, iron deficiency   Depression with anxiety    Left, loculated pleural effusion Hemoptysis - Loculate pleural effusion of moderate size seen on CT.  Etiology unclear.  Malignancy versus empyema.  Patient has has associated  hemoptysis.  CT negative for PE.  Consolidation also seen in LLL - Pro-Cal 0.39 and lactic acid normal, afebrile.  However patient does have leukocytosis of 22.  - Continue with broad-spectrum IV antibiotics for now - MRSA PCR ordered and pending - IR consulted for chest tube placement - Pulmonology consulted, will manage chest tube.  TBD if patient will require  intrapleural thrombolytics - Follow culture results - Legionella, strep pneumo antigen pending - Follow-up blood cultures - Follow sputum cultures - Respiratory viral panel negative  Lung nodules - Repeat CT in 3 months  Hyponatremia Hypokalemia - Replace as needed  Hypertension - Resume home meds - As needed hydralazine  labetalol  SVT - Continue home dose Coreg  - Patient is scheduled for SVT ablation with cardiology outpatient 4/29  Hypothyroidism - Continue liothyronine   Hyperlipidemia - Continue statin  Anemia, iron deficiency - Hemoglobin currently stable near baseline - Trend CBC - Transfuse if needed  Depression with anxiety - Continue home meds  DVT prophylaxis: SCDs   Code Status: Limited: Do not attempt resuscitation (DNR) -DNR-LIMITED -Do Not Intubate/DNI  Disposition: Inpatient, pending chest tube placement this afternoon.  Will then monitor for resolution of effusion.  Anticipate eventual charged to home, possibly with home health Consultants:  Interventional radiology Pulmonology  Procedures:    Antimicrobials:  Anti-infectives (From admission, onward)    Start     Dose/Rate Route Frequency Ordered Stop   08/24/23 2000  vancomycin  (VANCOREADY) IVPB 1500 mg/300 mL        1,500 mg 150 mL/hr over 120 Minutes Intravenous Every 24 hours 08/23/23 2040     08/24/23 1700  azithromycin  (ZITHROMAX ) 500 mg in sodium chloride  0.9 % 250 mL IVPB        500 mg 250 mL/hr over 60 Minutes Intravenous Every 24 hours 08/23/23 2001     08/24/23 0600  ceFEPIme  (MAXIPIME ) 2 g in sodium chloride  0.9 % 100 mL IVPB        2 g 200 mL/hr over 30 Minutes Intravenous Every 12 hours 08/23/23 2017     08/23/23 2045  vancomycin  (VANCOREADY) IVPB 500 mg/100 mL        500 mg 100 mL/hr over 60 Minutes Intravenous  Once 08/23/23 2031 08/23/23 2321   08/23/23 1700  vancomycin  (VANCOCIN ) IVPB 1000 mg/200 mL premix        1,000 mg 200 mL/hr over 60 Minutes Intravenous  Once  08/23/23 1646 08/23/23 2130   08/23/23 1700  ceFEPIme  (MAXIPIME ) 2 g in sodium chloride  0.9 % 100 mL IVPB        2 g 200 mL/hr over 30 Minutes Intravenous  Once 08/23/23 1646 08/23/23 1825   08/23/23 1700  azithromycin  (ZITHROMAX ) 500 mg in sodium chloride  0.9 % 250 mL IVPB        500 mg 250 mL/hr over 60 Minutes Intravenous  Once 08/23/23 1646 08/23/23 2031       Data Reviewed: I have personally reviewed following labs and imaging studies CBC: Recent Labs  Lab 08/23/23 1607 08/24/23 0506  WBC 22.4* 24.4*  NEUTROABS 19.4*  --   HGB 11.1* 9.6*  HCT 34.2* 28.2*  MCV 94.0 89.5  PLT 501* 424*   Basic Metabolic Panel: Recent Labs  Lab 08/23/23 1607 08/24/23 0506  NA 132* 132*  K 3.9 3.4*  CL 103 103  CO2 21* 20*  GLUCOSE 102* 99  BUN 14 13  CREATININE 0.78 0.73  CALCIUM  8.8* 8.4*   GFR: Estimated Creatinine Clearance: 58.6  mL/min (by C-G formula based on SCr of 0.73 mg/dL). Liver Function Tests: Recent Labs  Lab 08/23/23 1607  AST 22  ALT 15  ALKPHOS 90  BILITOT 1.0  PROT 7.1  ALBUMIN 2.9*   CBG: No results for input(s): "GLUCAP" in the last 168 hours.  Recent Results (from the past 240 hours)  Blood culture (routine x 2)     Status: None (Preliminary result)   Collection Time: 08/23/23  5:45 PM   Specimen: BLOOD  Result Value Ref Range Status   Specimen Description BLOOD LEFT ANTECUBITAL  Final   Special Requests   Final    BOTTLES DRAWN AEROBIC AND ANAEROBIC Blood Culture adequate volume   Culture   Final    NO GROWTH < 12 HOURS Performed at [facility], [address] [place], [place] [address]    Report Status PENDING  Incomplete  Blood culture (routine x 2)     Status: None (Preliminary result)   Collection Time: 08/23/23  5:45 PM   Specimen: BLOOD  Result Value Ref Range Status   Specimen Description BLOOD BLOOD RIGHT WRIST  Final   Special Requests   Final    BOTTLES DRAWN AEROBIC AND ANAEROBIC Blood Culture results may not be  optimal due to an inadequate volume of blood received in culture bottles   Culture   Final    NO GROWTH < 12 HOURS Performed at [facility], [address], [place], [place] [address]    Report Status PENDING  Incomplete  Resp panel by RT-PCR (RSV, Flu A&B, Covid) Anterior Nasal Swab     Status: None   Collection Time: 08/23/23  8:45 PM   Specimen: Anterior Nasal Swab  Result Value Ref Range Status   SARS Coronavirus 2 by RT PCR NEGATIVE NEGATIVE Final    Comment: (NOTE) SARS-CoV-2 target nucleic acids are NOT DETECTED.  The SARS-CoV-2 RNA is generally detectable in upper respiratory specimens during the acute phase of infection. The lowest concentration of SARS-CoV-2 viral copies this assay can detect is 138 copies/mL. A negative result does not preclude SARS-Cov-2 infection and should not be used as the sole basis for treatment or other patient management decisions. A negative result may occur with  improper specimen collection/handling, submission of specimen other than nasopharyngeal swab, presence of viral mutation(s) within the areas targeted by this assay, and inadequate number of viral copies(<138 copies/mL). A negative result must be combined with clinical observations, patient history, and epidemiological information. The expected result is Negative.  Fact Sheet for Patients:  BloggerCourse.com  Fact Sheet for Healthcare Providers:  SeriousBroker.it  This test is no t yet approved or cleared by the [place]  FDA and  has been authorized for detection and/or diagnosis of SARS-CoV-2 by FDA under an Emergency Use Authorization (EUA). This EUA will remain  in effect (meaning this test can be used) for the duration of the COVID-19 declaration under Section 564(b)(1) of the Act, 21 U.S.C.section 360bbb-3(b)(1), unless the authorization is terminated  or revoked sooner.       Influenza A by PCR NEGATIVE  NEGATIVE Final   Influenza B by PCR NEGATIVE NEGATIVE Final    Comment: (NOTE) The Xpert Xpress SARS-CoV-2/FLU/RSV plus assay is intended as an aid in the diagnosis of influenza from Nasopharyngeal swab specimens and should not be used as a sole basis for treatment. Nasal washings and aspirates are unacceptable for Xpert Xpress SARS-CoV-2/FLU/RSV testing.  Fact Sheet for Patients: BloggerCourse.com  Fact Sheet for Healthcare  Providers: SeriousBroker.it  This test is not yet approved or cleared by the [place]  FDA and has been authorized for detection and/or diagnosis of SARS-CoV-2 by FDA under an Emergency Use Authorization (EUA). This EUA will remain in effect (meaning this test can be used) for the duration of the COVID-19 declaration under Section 564(b)(1) of the Act, 21 U.S.C. section 360bbb-3(b)(1), unless the authorization is terminated or revoked.     Resp Syncytial Virus by PCR NEGATIVE NEGATIVE Final    Comment: (NOTE) Fact Sheet for Patients: BloggerCourse.com  Fact Sheet for Healthcare Providers: SeriousBroker.it  This test is not yet approved or cleared by the [place]  FDA and has been authorized for detection and/or diagnosis of SARS-CoV-2 by FDA under an Emergency Use Authorization (EUA). This EUA will remain in effect (meaning this test can be used) for the duration of the COVID-19 declaration under Section 564(b)(1) of the Act, 21 U.S.C. section 360bbb-3(b)(1), unless the authorization is terminated or revoked.  Performed at [facility], [address] [place], [place] [address]      Radiology Studies: CT Angio Chest PE W and/or Wo Contrast Result Date: 08/23/2023 CLINICAL DATA:  Productive cough for 2 weeks with shortness of breath and chest pain, clinically suspected pulmonary embolism. EXAM: CT ANGIOGRAPHY CHEST WITH CONTRAST  TECHNIQUE: Multidetector CT imaging of the chest was performed using the standard protocol during bolus administration of intravenous contrast. Multiplanar CT image reconstructions and MIPs were obtained to evaluate the vascular anatomy. RADIATION DOSE REDUCTION: This exam was performed according to the departmental dose-optimization program which includes automated exposure control, adjustment of the mA and/or kV according to patient size and/or use of iterative reconstruction technique. CONTRAST:  75mL OMNIPAQUE  IOHEXOL  350 MG/ML SOLN COMPARISON:  The last chest film available for direct comparison is portable chest 10/03/2014. No prior chest CT. The most recent study, with only the report available is a rib series and PA chest from 03/21/2022 performed at [facility]. Limited comparison is available with CT abdomen and pelvis with contrast 10/26/2016. FINDINGS: Cardiovascular: The pulmonary arteries are normal in caliber without evidence of arterial emboli or right heart strain. Central pulmonary veins are borderline prominent. The heart has slightly enlarged since 2018, with a small pericardial effusion also new. There are trace single vessel calcific plaques proximal LAD coronary artery. The aorta and great vessels are tortuous but normal in caliber with mild patchy aortic and minimal great vessel calcifications. There is no aortic dissection or stenosis. Mediastinum/Nodes: There are slightly prominent prevascular and left hilar lymph nodes both up to 1.1 cm short axis, precarinal lymph nodes to 1.2 cm, subcarinal nodal complex 1.4 cm, few right hilar lymph nodes up to 1.1 cm. There is no bulky or encasing adenopathy. Thyroid  gland, axillary spaces are unremarkable. Thoracic esophagus is unremarkable. Moderate-to-large sized hiatal hernia is increased in prominence since 2018. Within the hernia there is an intrathoracic gastric remnant status post old gastric bypass with the gastrojejunostomy within the sac. Chronic  asymmetric elevation again noted right hemidiaphragm. The trachea and main bronchi are clear. Lungs/Pleura: There is a moderate-sized left pleural effusion. Most of the effusion is either posterior or sub pulmonic. A portion of the fluid tracks laterally and cephalad and a small portion of the fluid is loculated in the left apex. On the right there is a small layering pleural effusion without loculations. There is compressive atelectasis medial right lower lobe alongside the hiatal hernia. Posterior dependent atelectasis also noted. There is bronchial thickening in the  lower lobes. In the left lower lobe there is scattered subsegmental bronchial impaction and there is consolidation or compressive collapse of the basal segments as well as of the dorsal lingula. On the right, there is a roughly rhomboid shaped well-circumscribed apical subpleural nodule containing an ectatic bronchiole and measuring 1 x 0.8 cm. This may represent a nodular scar but is indeterminate. Two other tiny nodules in the posterior segment of the right upper lobe, both measuring 3 mm, were noted on 6:41 and 6:58. The rest of the lungs are generally clear. Upper Abdomen: No acute abnormality. Abdominal aortic atherosclerosis. Musculoskeletal: Reverse S shaped thoracolumbar scoliosis noted with osteopenia and thoracic spondylosis. Multilevel degenerative discs. No acute or other significant osseous findings. Unremarkable visualized chest wall. The Review of the MIP images confirms the above findings. IMPRESSION: 1. No evidence of arterial emboli or right heart strain. 2. Slight cardiomegaly with small pericardial effusion. Borderline prominent pulmonary veins. 3. Aortic and trace coronary artery atherosclerosis. 4. Moderate-sized left pleural effusion, mostly posterior or subpulmonic, with a small portion of the fluid loculated in the left apex. 5. Small right pleural effusion without loculations. Atelectasis right lower lobe. 6. Bronchial  thickening in the lower lobes with scattered subsegmental bronchial impaction in the left lower lobe, and consolidation or compressive collapse of the left lower lobe basal segments and dorsal lingula. 7. 1 x 0.8 cm right apical subpleural nodule containing an ectatic bronchiole. This may represent a nodular scar but is indeterminate. Per Fleischner Society Guidelines, consider a non-contrast Chest CT at 3 months, a PET/CT, or tissue sampling. These guidelines do not apply to immunocompromised patients and patients with cancer. Follow up in patients with significant comorbidities as clinically warranted. For lung cancer screening, adhere to Lung-RADS guidelines. Reference: Radiology. 2017; 284(1):228-43. 8. Two other tiny 3 mm nodules in the posterior segment of the right upper lobe. 9. Osteopenia, scoliosis and degenerative change. Aortic Atherosclerosis (ICD10-I70.0).  Or Electronically Signed   By: Denman Fischer M.D.   On: 08/23/2023 20:51    Scheduled Meds:  buPROPion   300 mg Oral Daily   carvedilol   3.125 mg Oral BID WC   gabapentin   300 mg Oral TID   liothyronine   5 mcg Oral Daily   multivitamin with minerals  1 tablet Oral Daily   pantoprazole   40 mg Oral Daily   QUEtiapine   50 mg Oral QHS   rosuvastatin   10 mg Oral Daily   vitamin B-12  300 mcg Oral Daily   Continuous Infusions:  azithromycin      ceFEPime  (MAXIPIME ) IV 2 g (08/24/23 0629)   vancomycin        LOS: 1 day  MDM: Patient is high risk for one or more organ failure.  They necessitate ongoing hospitalization for continued IV therapies and subsequent lab monitoring. Total time spent interpreting labs and vitals, reviewing the medical record, coordinating care amongst consultants and care team members, directly assessing and discussing care with the patient and/or family: 55 min  Natanya Holecek, DO [facility]  To contact the attending physician between 7A-7P please use Epic Chat. To contact the covering physician  during after hours 7P-7A, please review Amion.  08/24/2023, 7:28 AM   *This document has been created with the assistance of dictation software. Please excuse typographical errors. *

## 2023-08-24 NOTE — Plan of Care (Signed)
  Problem: Health Behavior/Discharge Planning: Goal: Ability to manage health-related needs will improve Outcome: Progressing   Problem: Clinical Measurements: Goal: Cardiovascular complication will be avoided Outcome: Progressing   Problem: Coping: Goal: Level of anxiety will decrease Outcome: Progressing   Problem: Safety: Goal: Ability to remain free from injury will improve Outcome: Progressing

## 2023-08-24 NOTE — Consult Note (Signed)
 Chief Complaint: Patient was seen in consultation today for loculated left pleural effusion  Referring Physician(s): Leesa Kast, DO  Supervising Physician: Philip Cornet  Patient Status: [facility] - In-pt  History of Present Illness: Barbara Mclaughlin is a 73 y.o. female with a past medical history significant for depression, chronic fatigue syndrome, chronic pain, acoustic neuroma, GERD, HTN, SVT, anemia who presented to [facility] ED on 08/23/23 with complaints of left sided chest pain and dyspnea for several weeks. She was seen in the [facility] earlier that day and CXR showed large left pleural effusion and she was instructed to present to the ED for further evaluation. She underwent a CTA chest in the ED which showed:  1. No evidence of arterial emboli or right heart strain. 2. Slight cardiomegaly with small pericardial effusion. Borderline prominent pulmonary veins. 3. Aortic and trace coronary artery atherosclerosis. 4. Moderate-sized left pleural effusion, mostly posterior or subpulmonic, with a small portion of the fluid loculated in the left apex. 5. Small right pleural effusion without loculations. Atelectasis right lower lobe. 6. Bronchial thickening in the lower lobes with scattered subsegmental bronchial impaction in the left lower lobe, and consolidation or compressive collapse of the left lower lobe basal segments and dorsal lingula. 7. 1 x 0.8 cm right apical subpleural nodule containing an ectatic bronchiole. This may represent a nodular scar but is indeterminate. Per Fleischner Society Guidelines, consider a non-contrast Chest CT at 3 months, a PET/CT, or tissue sampling. These guidelines do not apply to immunocompromised patients and patients with cancer. Follow up in patients with significant comorbidities as clinically warranted. For lung cancer screening, adhere to Lung-RADS guidelines. Reference: Radiology. 2017; 284(1):228-43. 8. Two other tiny 3 mm  nodules in the posterior segment of the right upper lobe. 9. Osteopenia, scoliosis and degenerative change   She was admitted and seen by pulmonary who requests left chest tube placement for loculated pleural effusion in IR.   Past Medical History:  Diagnosis Date   Acoustic neuroma (HCC)    right ear   Anemia    Arthritis    B12 deficiency    Basal cell carcinoma, eyelid    CAP (community acquired pneumonia) 08/23/2023   Chronic fatigue syndrome    Chronic pain 11/20/2011   Depression    Dysrhythmia 0ne episode of tachycardia on 10/03/14   GERD (gastroesophageal reflux disease)    Headache    Heart murmur bruit   Hypertension    Hyperthyroidism 01/05/2015   Osteoporosis    Palpitations    Postablative hypothyroidism 09/03/2015   SVT (supraventricular tachycardia) (HCC)    Thyroid  nodule    Vitamin D  deficiency     Past Surgical History:  Procedure Laterality Date   BREAST REDUCTION SURGERY  1990   COLONOSCOPY WITH PROPOFOL  N/A 11/22/2020   Procedure: COLONOSCOPY WITH PROPOFOL ;  Surgeon: Maryruth Ole DASEN, MD;  Location: [facility] ENDOSCOPY;  Service: Endoscopy;  Laterality: N/A;   ESOPHAGOGASTRODUODENOSCOPY  11/22/2020   Procedure: ESOPHAGOGASTRODUODENOSCOPY (EGD);  Surgeon: Maryruth Ole DASEN, MD;  Location: [facility] ENDOSCOPY;  Service: Endoscopy;;   EYE SURGERY Bilateral cataract removal   GASTRIC BYPASS     LAPAROSCOPIC HYSTERECTOMY  07/2009   REDUCTION MAMMAPLASTY Bilateral 20+ yrs ago   TOTAL KNEE ARTHROPLASTY  2013   Dr. Mardee, left   TOTAL KNEE ARTHROPLASTY Right 06/2013    Allergies: Effexor  [venlafaxine ] and Topamax [topiramate]  Medications: Prior to Admission medications   Medication Sig Start Date End Date Taking? Authorizing Provider  amoxicillin  (AMOXIL ) 875  MG tablet Take 875 mg by mouth 2 (two) times daily. 08/21/23 08/31/23 Yes [provider]  buPROPion  (WELLBUTRIN  XL) 300 MG 24 hr tablet  07/09/17  Yes [provider]  carvedilol   (COREG ) 3.125 MG tablet Take 3.125 mg by mouth 2 (two) times daily with a meal. 05/14/17 08/23/23 Yes [provider]  celecoxib  (CELEBREX ) 100 MG capsule TAKE ONE CAPSULE BY MOUTH TWICE DAILY 12/06/15  Yes Vannie Delon LABOR, MD  Cholecalciferol (VITAMIN D3) 2000 UNITS capsule Take 2,000 Units by mouth daily.   Yes [provider]  Cyanocobalamin  (VITAMIN B-12) 250 MCG TABS Take 250 mcg by mouth daily. 02/12/20  Yes Opalski, Barnie, DO  gabapentin  (NEURONTIN ) 300 MG capsule Take 300 mg by mouth 3 (three) times daily.   Yes [provider]  HYDROcodone -acetaminophen  (NORCO) 7.5-325 MG tablet Take 1 tablet by mouth every 6 (six) hours as needed for moderate pain (pain score 4-6) or severe pain (pain score 7-10). 12/19/22  Yes [provider]  levothyroxine  (SYNTHROID , LEVOTHROID) 25 MCG tablet Take 75 mcg by mouth.  09/03/15 08/23/23 Yes [provider]  liothyronine  (CYTOMEL ) 5 MCG tablet Take 5 mcg by mouth daily. 11/23/22 02/05/24 Yes [provider]  Multiple Vitamin (MULTIVITAMIN) capsule Take 1 capsule by mouth daily.   Yes [provider]  omeprazole (PRILOSEC) 40 MG capsule Take 40 mg by mouth daily. 06/13/23  Yes [provider]  QUEtiapine  (SEROQUEL ) 50 MG tablet Take 50 mg by mouth.   Yes [provider]  rosuvastatin  (CRESTOR ) 10 MG tablet Take 10 mg by mouth daily. 09/20/22 09/20/23 Yes [provider]  sulfamethoxazole-trimethoprim (BACTRIM DS) 800-160 MG tablet Take 1 tablet by mouth 2 (two) times daily. 06/18/23  Yes [provider]  Vitamin D , Ergocalciferol , (DRISDOL ) 1.25 MG (50000 UNIT) CAPS capsule Take 1 capsule (50,000 Units total) by mouth every 7 (seven) days. 02/12/20  Yes Opalski, Barnie, DO  ALPRAZolam (XANAX) 0.25 MG tablet Take by mouth. Patient not taking: Reported on 07/02/2023 11/17/15   [provider]  amLODipine (NORVASC) 5 MG tablet Take 5 mg by mouth daily. Patient not  taking: Reported on 08/23/2023    [provider]  bisacodyl (BISACODYL) 5 MG EC tablet Take 5 mg by mouth daily as needed.  Patient not taking: Reported on 08/23/2023    [provider]  docusate sodium  (COLACE) 100 MG capsule Take by mouth. Patient not taking: Reported on 08/23/2023    [provider]  esomeprazole  (NEXIUM ) 40 MG capsule Take 80 mg by mouth every morning. Patient not taking: Reported on 07/02/2023    [provider]  ferrous sulfate 325 (65 FE) MG tablet Take by mouth. Patient not taking: Reported on 08/23/2023    [provider]  fluocinonide  ointment (LIDEX ) 0.05 % Apply topically 2 (two) times daily. Patient not taking: Reported on 07/02/2023 09/06/12   Vannie Delon LABOR, MD  FLUoxetine  (PROZAC ) 20 MG capsule TAKE TWO CAPSULES BY MOUTH IN THE MORNING AND TAKE ONE CAPSULE BY MOUTH IN THE EVENING Patient not taking: Reported on 06/18/2023 12/06/15   Vannie Delon LABOR, MD  loratadine (CLARITIN) 10 MG tablet Take 10 mg by mouth daily. Patient not taking: Reported on 08/23/2023    [provider]  losartan  (COZAAR ) 50 MG tablet TAKE ONE TABLET BY MOUTH ONCE DAILY Patient not taking: Reported on 07/02/2023 06/29/15   Vannie Delon LABOR, MD  oxyCODONE -acetaminophen  (PERCOCET/ROXICET) 5-325 MG tablet Take by mouth. Patient not taking: Reported on 06/18/2023  09/15/14   [provider]  predniSONE (DELTASONE) 10 MG tablet Taper 6-5-4-3-2-1-off Patient not taking: Reported on 06/18/2023 06/08/17   [provider]  ranitidine (ZANTAC) 150 MG tablet Take by mouth. Patient not taking: Reported on 07/02/2023    [provider]  Semaglutide, 1 MG/DOSE, 4 MG/3ML SOPN Inject into the skin. 05/07/23   [provider]  topiramate (TOPAMAX) 50 MG tablet Take 50 mg by mouth 2 (two) times daily. Patient not taking: Reported on 08/23/2023 11/15/21   [provider]  valsartan (DIOVAN) 160 MG tablet Take 1 tablet  by mouth 2 (two) times daily. Patient not taking: Reported on 07/02/2023 04/04/21   [provider]     Family History  Problem Relation Age of Onset   Hypertension Mother    Depression Mother    Obesity Mother    Alcoholism Father    Breast cancer Paternal Aunt     Social History   Socioeconomic History   Marital status: Married    Spouse name: Not on file   Number of children: Not on file   Years of education: Not on file   Highest education level: Not on file  Occupational History   Occupation: Retired  Tobacco Use   Smoking status: Never   Smokeless tobacco: Never  Vaping Use   Vaping status: Never Used  Substance and Sexual Activity   Alcohol use: No   Drug use: No   Sexual activity: Not on file  Other Topics Concern   Not on file  Social History Narrative   Not on file   Social Drivers of Health   Financial Resource Strain: Low Risk  (03/12/2023)   Received from [facility]   Overall Financial Resource Strain (CARDIA)    Difficulty of Paying Living Expenses: Not hard at all  Food Insecurity: No Food Insecurity (03/12/2023)   Received from [facility]   Hunger Vital Sign    Worried About Running Out of Food in the Last Year: Never true    Ran Out of Food in the Last Year: Never true  Transportation Needs: No Transportation Needs (03/12/2023)   Received from [facility] - Transportation    In the past 12 months, has lack of transportation kept you from medical appointments or from getting medications?: No    Lack of Transportation (Non-Medical): No  Physical Activity: Not on file  Stress: Not on file  Social Connections: Unknown (12/11/2022)   Received from [facility]   Social Network    Social Network: Not on file     Review of Systems: A 12 point ROS discussed and pertinent positives are indicated in the HPI above.  All other systems are negative.  Review of Systems   Constitutional:  Positive for fatigue. Negative for chills and fever.  Respiratory:  Positive for cough and shortness of breath.   Cardiovascular:  Positive for chest pain (left sided).  Gastrointestinal:  Negative for abdominal pain, diarrhea, nausea and vomiting.  Musculoskeletal:  Negative for back pain.  Neurological:  Negative for dizziness and headaches.    Vital Signs: BP 133/70 (BP Location: Left Arm)   Pulse 90   Temp 98.7 F (37.1 C) (Oral)   Resp 20   Ht 5' 6 (1.676 m)   Wt 150 lb (68 kg)   SpO2 100%   BMI 24.21 kg/m   Physical Exam Vitals and nursing note reviewed.  Constitutional:  General: She is not in acute distress. HENT:     Head: Normocephalic.     Mouth/Throat:     Mouth: Mucous membranes are moist.     Pharynx: Oropharynx is clear. No oropharyngeal exudate or posterior oropharyngeal erythema.  Cardiovascular:     Rate and Rhythm: Normal rate and regular rhythm.  Pulmonary:     Effort: Pulmonary effort is normal.     Comments: Decreased breath sounds on the left Abdominal:     Palpations: Abdomen is soft.  Skin:    General: Skin is warm and dry.  Neurological:     Mental Status: She is alert and oriented to person, place, and time.  Psychiatric:        Mood and Affect: Mood normal.        Behavior: Behavior normal.        Thought Content: Thought content normal.        Judgment: Judgment normal.      MD Evaluation Airway: WNL Heart: WNL Abdomen: WNL Chest/ Lungs: WNL ASA  Classification: 3 Mallampati/Airway Score: Two   Imaging: CT Angio Chest PE W and/or Wo Contrast Result Date: 08/23/2023 CLINICAL DATA:  Productive cough for 2 weeks with shortness of breath and chest pain, clinically suspected pulmonary embolism. EXAM: CT ANGIOGRAPHY CHEST WITH CONTRAST TECHNIQUE: Multidetector CT imaging of the chest was performed using the standard protocol during bolus administration of intravenous contrast. Multiplanar CT image  reconstructions and MIPs were obtained to evaluate the vascular anatomy. RADIATION DOSE REDUCTION: This exam was performed according to the departmental dose-optimization program which includes automated exposure control, adjustment of the mA and/or kV according to patient size and/or use of iterative reconstruction technique. CONTRAST:  75mL OMNIPAQUE  IOHEXOL  350 MG/ML SOLN COMPARISON:  The last chest film available for direct comparison is portable chest 10/03/2014. No prior chest CT. The most recent study, with only the report available is a rib series and PA chest from 03/21/2022 performed at [facility]. Limited comparison is available with CT abdomen and pelvis with contrast 10/26/2016. FINDINGS: Cardiovascular: The pulmonary arteries are normal in caliber without evidence of arterial emboli or right heart strain. Central pulmonary veins are borderline prominent. The heart has slightly enlarged since 2018, with a small pericardial effusion also new. There are trace single vessel calcific plaques proximal LAD coronary artery. The aorta and great vessels are tortuous but normal in caliber with mild patchy aortic and minimal great vessel calcifications. There is no aortic dissection or stenosis. Mediastinum/Nodes: There are slightly prominent prevascular and left hilar lymph nodes both up to 1.1 cm short axis, precarinal lymph nodes to 1.2 cm, subcarinal nodal complex 1.4 cm, few right hilar lymph nodes up to 1.1 cm. There is no bulky or encasing adenopathy. Thyroid  gland, axillary spaces are unremarkable. Thoracic esophagus is unremarkable. Moderate-to-large sized hiatal hernia is increased in prominence since 2018. Within the hernia there is an intrathoracic gastric remnant status post old gastric bypass with the gastrojejunostomy within the sac. Chronic asymmetric elevation again noted right hemidiaphragm. The trachea and main bronchi are clear. Lungs/Pleura: There is a moderate-sized left pleural effusion. Most of  the effusion is either posterior or sub pulmonic. A portion of the fluid tracks laterally and cephalad and a small portion of the fluid is loculated in the left apex. On the right there is a small layering pleural effusion without loculations. There is compressive atelectasis medial right lower lobe alongside the hiatal hernia. Posterior dependent atelectasis also noted. There is bronchial thickening  in the lower lobes. In the left lower lobe there is scattered subsegmental bronchial impaction and there is consolidation or compressive collapse of the basal segments as well as of the dorsal lingula. On the right, there is a roughly rhomboid shaped well-circumscribed apical subpleural nodule containing an ectatic bronchiole and measuring 1 x 0.8 cm. This may represent a nodular scar but is indeterminate. Two other tiny nodules in the posterior segment of the right upper lobe, both measuring 3 mm, were noted on 6:41 and 6:58. The rest of the lungs are generally clear. Upper Abdomen: No acute abnormality. Abdominal aortic atherosclerosis. Musculoskeletal: Reverse S shaped thoracolumbar scoliosis noted with osteopenia and thoracic spondylosis. Multilevel degenerative discs. No acute or other significant osseous findings. Unremarkable visualized chest wall. The Review of the MIP images confirms the above findings. IMPRESSION: 1. No evidence of arterial emboli or right heart strain. 2. Slight cardiomegaly with small pericardial effusion. Borderline prominent pulmonary veins. 3. Aortic and trace coronary artery atherosclerosis. 4. Moderate-sized left pleural effusion, mostly posterior or subpulmonic, with a small portion of the fluid loculated in the left apex. 5. Small right pleural effusion without loculations. Atelectasis right lower lobe. 6. Bronchial thickening in the lower lobes with scattered subsegmental bronchial impaction in the left lower lobe, and consolidation or compressive collapse of the left lower lobe  basal segments and dorsal lingula. 7. 1 x 0.8 cm right apical subpleural nodule containing an ectatic bronchiole. This may represent a nodular scar but is indeterminate. Per Fleischner Society Guidelines, consider a non-contrast Chest CT at 3 months, a PET/CT, or tissue sampling. These guidelines do not apply to immunocompromised patients and patients with cancer. Follow up in patients with significant comorbidities as clinically warranted. For lung cancer screening, adhere to Lung-RADS guidelines. Reference: Radiology. 2017; 284(1):228-43. 8. Two other tiny 3 mm nodules in the posterior segment of the right upper lobe. 9. Osteopenia, scoliosis and degenerative change. Aortic Atherosclerosis (ICD10-I70.0).  Or Electronically Signed   By: Francis Quam M.D.   On: 08/23/2023 20:51    Labs:  CBC: Recent Labs    06/16/23 1131 06/18/23 1425 08/23/23 1607 08/24/23 0506  WBC 17.6* 8.7 22.4* 24.4*  HGB 9.8* 10.0* 11.1* 9.6*  HCT 29.5* 31.1* 34.2* 28.2*  PLT 202 243 501* 424*    COAGS: Recent Labs    08/23/23 2120  INR 1.5*  APTT 47*    BMP: Recent Labs    06/16/23 1131 08/23/23 1607 08/24/23 0506  NA 138 132* 132*  K 3.4* 3.9 3.4*  CL 111 103 103  CO2 19* 21* 20*  GLUCOSE 106* 102* 99  BUN 16 14 13   CALCIUM  8.0* 8.8* 8.4*  CREATININE 0.98 0.78 0.73  GFRNONAA >60 >60 >60    LIVER FUNCTION TESTS: Recent Labs    06/16/23 1131 08/23/23 1607  BILITOT 0.5 1.0  AST 19 22  ALT 11 15  ALKPHOS 39 90  PROT 5.7* 7.1  ALBUMIN 3.1* 2.9*    TUMOR MARKERS: No results for input(s): AFPTM, CEA, CA199, CHROMGRNA in the last 8760 hours.  Assessment and Plan:  73 y/o F admitted with left chest pain and dyspnea found to have large left loculated pleural effusion. IR has been consulted for left chest tube placement with pulmonary service to manage. Patient history and imaging reviewed by Dr. Philip who approves procedure for later this afternoon.  Risks and benefits discussed  with the patient including bleeding, infection, damage to adjacent structures, pneumothorax and sepsis.  All of the  patient's questions were answered, patient is agreeable to proceed.  Consent signed and in chart.  Thank you for this interesting consult.  I greatly enjoyed meeting GRENDA LORA and look forward to participating in their care.  A copy of this report was sent to the requesting provider on this date.  Electronically Signed: Clotilda DELENA Hesselbach, PA-C 08/24/2023, 11:32 AM   I spent a total of 40 Minutes in face to face in clinical consultation, greater than 50% of which was counseling/coordinating care for loculated pleural effusion.

## 2023-08-25 ENCOUNTER — Inpatient Hospital Stay

## 2023-08-25 DIAGNOSIS — R042 Hemoptysis: Secondary | ICD-10-CM | POA: Diagnosis not present

## 2023-08-25 DIAGNOSIS — E890 Postprocedural hypothyroidism: Secondary | ICD-10-CM | POA: Diagnosis not present

## 2023-08-25 DIAGNOSIS — K449 Diaphragmatic hernia without obstruction or gangrene: Secondary | ICD-10-CM | POA: Diagnosis not present

## 2023-08-25 DIAGNOSIS — J90 Pleural effusion, not elsewhere classified: Secondary | ICD-10-CM | POA: Diagnosis not present

## 2023-08-25 DIAGNOSIS — I10 Essential (primary) hypertension: Secondary | ICD-10-CM | POA: Diagnosis not present

## 2023-08-25 DIAGNOSIS — J189 Pneumonia, unspecified organism: Secondary | ICD-10-CM | POA: Diagnosis not present

## 2023-08-25 LAB — CBC WITH DIFFERENTIAL/PLATELET
Abs Immature Granulocytes: 0.2 10*3/uL — ABNORMAL HIGH (ref 0.00–0.07)
Basophils Absolute: 0.1 10*3/uL (ref 0.0–0.1)
Basophils Relative: 1 %
Eosinophils Absolute: 0.4 10*3/uL (ref 0.0–0.5)
Eosinophils Relative: 2 %
HCT: 28.4 % — ABNORMAL LOW (ref 36.0–46.0)
Hemoglobin: 9.5 g/dL — ABNORMAL LOW (ref 12.0–15.0)
Immature Granulocytes: 1 %
Lymphocytes Relative: 6 %
Lymphs Abs: 1 10*3/uL (ref 0.7–4.0)
MCH: 30.1 pg (ref 26.0–34.0)
MCHC: 33.5 g/dL (ref 30.0–36.0)
MCV: 89.9 fL (ref 80.0–100.0)
Monocytes Absolute: 1.8 10*3/uL — ABNORMAL HIGH (ref 0.1–1.0)
Monocytes Relative: 10 %
Neutro Abs: 13.8 10*3/uL — ABNORMAL HIGH (ref 1.7–7.7)
Neutrophils Relative %: 80 %
Platelets: 461 10*3/uL — ABNORMAL HIGH (ref 150–400)
RBC: 3.16 MIL/uL — ABNORMAL LOW (ref 3.87–5.11)
RDW: 12.6 % (ref 11.5–15.5)
WBC: 17.3 10*3/uL — ABNORMAL HIGH (ref 4.0–10.5)
nRBC: 0 % (ref 0.0–0.2)

## 2023-08-25 LAB — COMPREHENSIVE METABOLIC PANEL WITH GFR
ALT: 14 U/L (ref 0–44)
AST: 19 U/L (ref 15–41)
Albumin: 2.3 g/dL — ABNORMAL LOW (ref 3.5–5.0)
Alkaline Phosphatase: 87 U/L (ref 38–126)
Anion gap: 7 (ref 5–15)
BUN: 14 mg/dL (ref 8–23)
CO2: 20 mmol/L — ABNORMAL LOW (ref 22–32)
Calcium: 8.3 mg/dL — ABNORMAL LOW (ref 8.9–10.3)
Chloride: 105 mmol/L (ref 98–111)
Creatinine, Ser: 0.78 mg/dL (ref 0.44–1.00)
GFR, Estimated: >60 mL/min (ref >60)
Glucose, Bld: 90 mg/dL (ref 70–99)
Potassium: 3.3 mmol/L — ABNORMAL LOW (ref 3.5–5.1)
Sodium: 132 mmol/L — ABNORMAL LOW (ref 135–145)
Total Bilirubin: 0.5 mg/dL (ref 0.0–1.2)
Total Protein: 5.7 g/dL — ABNORMAL LOW (ref 6.5–8.1)

## 2023-08-25 LAB — CREATININE, SERUM
Creatinine, Ser: 0.79 mg/dL (ref 0.44–1.00)
GFR, Estimated: >60 mL/min (ref >60)

## 2023-08-25 LAB — STREP PNEUMONIAE URINARY ANTIGEN: Strep Pneumo Urinary Antigen: NEGATIVE

## 2023-08-25 LAB — MRSA NEXT GEN BY PCR, NASAL: MRSA by PCR Next Gen: NOT DETECTED

## 2023-08-25 LAB — TROPONIN I (HIGH SENSITIVITY): Troponin I (High Sensitivity): 7 ng/L (ref <18)

## 2023-08-25 MED ORDER — MORPHINE SULFATE (PF) 2 MG/ML IV SOLN
2.0000 mg | INTRAVENOUS | Status: DC | PRN
  Administered 2023-08-25: 2 mg via INTRAVENOUS
  Filled 2023-08-25: qty 1

## 2023-08-25 MED ORDER — STERILE WATER FOR INJECTION IJ SOLN
5.0000 mg | Freq: Once | RESPIRATORY_TRACT | Status: AC
  Administered 2023-08-25: 5 mg via INTRAPLEURAL
  Filled 2023-08-25: qty 5

## 2023-08-25 MED ORDER — SODIUM CHLORIDE (PF) 0.9 % IJ SOLN
10.0000 mg | Freq: Once | INTRAMUSCULAR | Status: AC
  Administered 2023-08-25: 10 mg via INTRAPLEURAL
  Filled 2023-08-25: qty 10

## 2023-08-25 MED ORDER — METRONIDAZOLE 500 MG/100ML IV SOLN
500.0000 mg | Freq: Two times a day (BID) | INTRAVENOUS | Status: DC
  Administered 2023-08-25 – 2023-08-28 (×6): 500 mg via INTRAVENOUS
  Filled 2023-08-25 (×7): qty 100

## 2023-08-25 MED ORDER — BACLOFEN 10 MG PO TABS
5.0000 mg | ORAL_TABLET | Freq: Three times a day (TID) | ORAL | Status: DC
  Administered 2023-08-25 – 2023-08-26 (×3): 5 mg via ORAL
  Filled 2023-08-25 (×4): qty 1

## 2023-08-25 MED ORDER — SODIUM CHLORIDE 0.9% FLUSH
10.0000 mL | Freq: Three times a day (TID) | INTRAVENOUS | Status: DC
  Administered 2023-08-25 – 2023-08-29 (×12): 10 mL via INTRAPLEURAL

## 2023-08-25 NOTE — Progress Notes (Signed)
 PROGRESS NOTE    Barbara Mclaughlin  ZOX:[id number] DOB: Mar 10, 1951 DOA: 08/23/2023 PCP: Yehuda Helms, MD  Chief Complaint  Patient presents with   Cough    Hospital Course:  Barbara Mclaughlin is 73 y.o. female with hypertension, hyperlipidemia, postablative hypothyroidism, depression, anxiety, anemia, history of SVT, acoustic neuroma, who presents with cough, shortness of breath, chest pain.  Patient reports persistent dry cough for the last 5 weeks but recently began having hemoptysis..  Patient is scheduled for an outpatient SVT ablation by her cardiologist on 4/29.  He has given her prescription of prophylaxis amoxicillin  which she took for 3 days prior to arrival.  Patient was seen at [facility] on 4/17 and had chest x-ray performed which revealed large left-sided pleural effusion and she was sent to ED for further evaluation.  In the ED she was found have a leukocytosis of 22.4, BNP 117, lactic acid 1.2, Pro-Cal 0.39.  Vital signs were stable.  O2 sat 95% on room air.  CTA was performed which revealed no pulmonary embolism.  CT did reveal cardiomegaly with small pericardial effusion, moderately sized left pleural effusion mostly posterior or subpulmonic with a small portion of fluid in the left apex.  Also revealed a right pleural effusion without loculations and bronchial thickening in the lower lobes with scattered subsegmental bronchial impaction of left lower lobe and consolidation or compressive collapse of the left lower lobe basal segments and dorsal lingula.  A 1 x 0.8 cm right apical subpleural nodule containing ectatic bronchial was also noted as well as 3 other tiny nodules in the posterior segment of the right upper lobe.  She was admitted and initiated on antibiotics.    Subjective: Patient requesting full diet this morning.  Tolerated procedure well yesterday.  Is having increased pain this morning and is requesting higher dose pain meds.  At the time of my  evaluation patient is receiving pleural fibrinolysis with pulmonology.   Objective: Vitals:   08/24/23 1813 08/24/23 2032 08/25/23 0037 08/25/23 0426  BP: (!) 112/53 124/61 (!) 111/51 (!) 126/59  Pulse: 78 69 72 69  Resp: 18 18 18 18   Temp: 98.1 F (36.7 C) 98.4 F (36.9 C) 99.2 F (37.3 C) 98 F (36.7 C)  TempSrc:   Oral Oral  SpO2: 97% 96% 95% 95%  Weight:      Height:        Intake/Output Summary (Last 24 hours) at 08/25/2023 0810 Last data filed at 08/25/2023 0500 Gross per 24 hour  Intake --  Output 1150 ml  Net -1150 ml   Filed Weights   08/23/23 1605  Weight: 68 kg    Examination: General exam: Appears calm and comfortable, NAD  Respiratory system: No work of breathing, decreased aeration left side Cardiovascular system: S1 & S2 heard, RRR.  Gastrointestinal system: Abdomen is nondistended, soft and nontender.  Neuro: Alert and oriented. No focal neurological deficits. Extremities: Symmetric, expected ROM Skin: No rashes, lesions Psychiatry: Demonstrates appropriate judgement and insight. Mood & affect appropriate for situation.   Assessment & Plan:  Principal Problem:   Pleural effusion Active Problems:   CAP (community acquired pneumonia)   Lung nodule   Hypertension   SVT (supraventricular tachycardia) (HCC)   Hypothyroidism   HLD (hyperlipidemia)   Anemia, iron deficiency   Depression with anxiety    Left, loculated pleural effusion Hemoptysis - Loculate pleural effusion of moderate size seen on CT.  Etiology unclear.  Malignancy versus empyema.  Patient has has  associated hemoptysis.  CT negative for PE.  Consolidation also seen in LLL - Hemodynamically stable on arrival, procal 0.39, afebrile.  WBC 22 - Status post chest tube placement with IR 4/18 - Pulmonology consulted to manage, may require intrapleural thrombolytics - Will follow pleural cultures.  At this time no growth.  Predominantly neutrophils. - Aspirated fluid most consistent with  empyema. - Cytology sent.  Follow closely - For now we will continue with azithromycin , cefepime , vancomycin , Flagyl .  De-escalate when able - MRSA PCR was ordered, no results.  Have reordered and communicated with RN this morning -- Strep pneumo negative, Legionella pending - Follow-up blood cultures, currently neg - Follow sputum cultures - Respiratory viral panel negative  Lung nodules - Repeat CT in 3 months  Hyponatremia Hypokalemia - Continue to monitor and replace as needed  Hypertension - Resume home meds - As needed hydralazine  labetalol  SVT -Monitoring on telemetry, currently stable - Continue with home dose Coreg  - Patient is scheduled for SVT ablation with cardiology outpatient 4/29  Hypothyroidism - Continue liothyronine   Hyperlipidemia - Continue statin  Anemia, iron deficiency - Hemoglobin currently stable near baseline -continue to trend CBC - Transfuse if needed  Depression with anxiety - Continue home meds  DVT prophylaxis: SCDs   Code Status: Limited: Do not attempt resuscitation (DNR) -DNR-LIMITED -Do Not Intubate/DNI  Disposition:  Consultants:  Interventional radiology Pulmonology  Procedures:    Antimicrobials:  Anti-infectives (From admission, onward)    Start     Dose/Rate Route Frequency Ordered Stop   08/24/23 2000  vancomycin  (VANCOREADY) IVPB 1500 mg/300 mL        1,500 mg 150 mL/hr over 120 Minutes Intravenous Every 24 hours 08/23/23 2040     08/24/23 1700  azithromycin  (ZITHROMAX ) 500 mg in sodium chloride  0.9 % 250 mL IVPB        500 mg 250 mL/hr over 60 Minutes Intravenous Every 24 hours 08/23/23 2001     08/24/23 0600  ceFEPIme  (MAXIPIME ) 2 g in sodium chloride  0.9 % 100 mL IVPB        2 g 200 mL/hr over 30 Minutes Intravenous Every 12 hours 08/23/23 2017     08/23/23 2045  vancomycin  (VANCOREADY) IVPB 500 mg/100 mL        500 mg 100 mL/hr over 60 Minutes Intravenous  Once 08/23/23 2031 08/23/23 2321   08/23/23 1700   vancomycin  (VANCOCIN ) IVPB 1000 mg/200 mL premix        1,000 mg 200 mL/hr over 60 Minutes Intravenous  Once 08/23/23 1646 08/23/23 2130   08/23/23 1700  ceFEPIme  (MAXIPIME ) 2 g in sodium chloride  0.9 % 100 mL IVPB        2 g 200 mL/hr over 30 Minutes Intravenous  Once 08/23/23 1646 08/23/23 1825   08/23/23 1700  azithromycin  (ZITHROMAX ) 500 mg in sodium chloride  0.9 % 250 mL IVPB        500 mg 250 mL/hr over 60 Minutes Intravenous  Once 08/23/23 1646 08/23/23 2031       Data Reviewed: I have personally reviewed following labs and imaging studies CBC: Recent Labs  Lab 08/23/23 1607 08/24/23 0506  WBC 22.4* 24.4*  NEUTROABS 19.4*  --   HGB 11.1* 9.6*  HCT 34.2* 28.2*  MCV 94.0 89.5  PLT 501* 424*   Basic Metabolic Panel: Recent Labs  Lab 08/23/23 1607 08/24/23 0506 08/25/23 0325  NA 132* 132*  --   K 3.9 3.4*  --   CL 103  103  --   CO2 21* 20*  --   GLUCOSE 102* 99  --   BUN 14 13  --   CREATININE 0.78 0.73 0.79  CALCIUM  8.8* 8.4*  --    GFR: Estimated Creatinine Clearance: 58.6 mL/min (by C-G formula based on SCr of 0.79 mg/dL). Liver Function Tests: Recent Labs  Lab 08/23/23 1607  AST 22  ALT 15  ALKPHOS 90  BILITOT 1.0  PROT 7.1  ALBUMIN 2.9*   CBG: No results for input(s): "GLUCAP" in the last 168 hours.  Recent Results (from the past 240 hours)  Blood culture (routine x 2)     Status: None (Preliminary result)   Collection Time: 08/23/23  5:45 PM   Specimen: BLOOD  Result Value Ref Range Status   Specimen Description BLOOD LEFT ANTECUBITAL  Final   Special Requests   Final    BOTTLES DRAWN AEROBIC AND ANAEROBIC Blood Culture adequate volume   Culture   Final    NO GROWTH 2 DAYS Performed at [facility], [address] [place], [place] [address]    Report Status PENDING  Incomplete  Blood culture (routine x 2)     Status: None (Preliminary result)   Collection Time: 08/23/23  5:45 PM   Specimen: BLOOD  Result Value Ref Range  Status   Specimen Description BLOOD BLOOD RIGHT WRIST  Final   Special Requests   Final    BOTTLES DRAWN AEROBIC AND ANAEROBIC Blood Culture results may not be optimal due to an inadequate volume of blood received in culture bottles   Culture   Final    NO GROWTH 2 DAYS Performed at [facility], [address] [place], [place] [address]    Report Status PENDING  Incomplete  Resp panel by RT-PCR (RSV, Flu A&B, Covid) Anterior Nasal Swab     Status: None   Collection Time: 08/23/23  8:45 PM   Specimen: Anterior Nasal Swab  Result Value Ref Range Status   SARS Coronavirus 2 by RT PCR NEGATIVE NEGATIVE Final    Comment: (NOTE) SARS-CoV-2 target nucleic acids are NOT DETECTED.  The SARS-CoV-2 RNA is generally detectable in upper respiratory specimens during the acute phase of infection. The lowest concentration of SARS-CoV-2 viral copies this assay can detect is 138 copies/mL. A negative result does not preclude SARS-Cov-2 infection and should not be used as the sole basis for treatment or other patient management decisions. A negative result may occur with  improper specimen collection/handling, submission of specimen other than nasopharyngeal swab, presence of viral mutation(s) within the areas targeted by this assay, and inadequate number of viral copies(<138 copies/mL). A negative result must be combined with clinical observations, patient history, and epidemiological information. The expected result is Negative.  Fact Sheet for Patients:  BloggerCourse.com  Fact Sheet for Healthcare Providers:  SeriousBroker.it  This test is no t yet approved or cleared by the [place]  FDA and  has been authorized for detection and/or diagnosis of SARS-CoV-2 by FDA under an Emergency Use Authorization (EUA). This EUA will remain  in effect (meaning this test can be used) for the duration of the COVID-19 declaration under Section  564(b)(1) of the Act, 21 U.S.C.section 360bbb-3(b)(1), unless the authorization is terminated  or revoked sooner.       Influenza A by PCR NEGATIVE NEGATIVE Final   Influenza B by PCR NEGATIVE NEGATIVE Final    Comment: (NOTE) The Xpert Xpress SARS-CoV-2/FLU/RSV plus assay is intended as an aid  in the diagnosis of influenza from Nasopharyngeal swab specimens and should not be used as a sole basis for treatment. Nasal washings and aspirates are unacceptable for Xpert Xpress SARS-CoV-2/FLU/RSV testing.  Fact Sheet for Patients: BloggerCourse.com  Fact Sheet for Healthcare Providers: SeriousBroker.it  This test is not yet approved or cleared by the [place]  FDA and has been authorized for detection and/or diagnosis of SARS-CoV-2 by FDA under an Emergency Use Authorization (EUA). This EUA will remain in effect (meaning this test can be used) for the duration of the COVID-19 declaration under Section 564(b)(1) of the Act, 21 U.S.C. section 360bbb-3(b)(1), unless the authorization is terminated or revoked.     Resp Syncytial Virus by PCR NEGATIVE NEGATIVE Final    Comment: (NOTE) Fact Sheet for Patients: BloggerCourse.com  Fact Sheet for Healthcare Providers: SeriousBroker.it  This test is not yet approved or cleared by the [place]  FDA and has been authorized for detection and/or diagnosis of SARS-CoV-2 by FDA under an Emergency Use Authorization (EUA). This EUA will remain in effect (meaning this test can be used) for the duration of the COVID-19 declaration under Section 564(b)(1) of the Act, 21 U.S.C. section 360bbb-3(b)(1), unless the authorization is terminated or revoked.  Performed at [facility], [address] [place], [place] [address]   Body fluid culture w Gram Stain     Status: None (Preliminary result)   Collection Time: 08/24/23  9:41 AM    Specimen: Pleura; Body Fluid  Result Value Ref Range Status   Specimen Description   Final    PLEURAL Performed at [facility], [address] [place], [place] [address]    Special Requests   Final    NONE Performed at [facility], [address] [place], [place] [address]    Gram Stain   Final    RARE WBC PRESENT, PREDOMINANTLY PMN NO ORGANISMS SEEN    Culture   Final    NO GROWTH < 12 HOURS Performed at [facility], [address] [place], [place] [address]    Report Status PENDING  Incomplete     Radiology Studies: IR PERC PLEURAL DRAIN W/INDWELL CATH W/IMG GUIDE Result Date: 08/24/2023 INDICATION: 73 year old presented with productive cough and shortness of breath. Chest CT demonstrated a loculated left pleural effusion. Request for left chest tube placement. EXAM: IMAGE GUIDED PLACEMENT OF LEFT CHEST TUBE MEDICATIONS: Moderate sedation ANESTHESIA/SEDATION: Moderate (conscious) sedation was employed during this procedure. A total of Versed  2 mg and Fentanyl  100 mcg was administered intravenously by the radiology nurse. Total intra-service moderate Sedation Time: 18 minutes. The patient's level of consciousness and vital signs were monitored continuously by radiology nursing throughout the procedure under my direct supervision. COMPLICATIONS: None immediate. FLUOROSCOPY TIME:  Radiation Exposure Index (as provided by the fluoroscopic device): 1.9 mGy Kerma PROCEDURE: Informed written consent was obtained from the patient after a thorough discussion of the procedural risks, benefits and alternatives. All questions were addressed. Maximal Sterile Barrier Technique was utilized including caps, mask, sterile gowns, sterile gloves, sterile drape, hand hygiene and skin antiseptic. A timeout was performed prior to the initiation of the procedure. Patient was placed supine on the interventional table. The left mid axillary region was prepped and draped in sterile  fashion. Ultrasound demonstrated loculated left pleural effusion. Left mid axillary region was anesthetized using 1% lidocaine . A small incision was made. Using ultrasound guidance, a Yueh catheter was directed into the loculated pleural fluid and straw-colored fluid was aspirated. Superstiff  Amplatz wire was advanced into the pleural space and the tract was dilated to accommodate a 14 [place] multipurpose drain. Fluid was collected for labs. Chest tube was attached to a chest drainage system. Chest tube was sutured to the skin. Dressing was placed. Fluoroscopic and ultrasound images were taken and saved for documentation. FINDINGS: Ultrasound demonstrated a loculated left pleural effusion. Large fluid pocket along the posterior left mid axillary region was targeted. Straw-colored fluid was aspirated. Chest tube was in the lower portion of the left chest at the end of the procedure. IMPRESSION: Image guided placement of a left chest tube. Electronically Signed   By: Elene Griffes M.D.   On: 08/24/2023 16:41   US  CHEST (PLEURAL EFFUSION) Result Date: 08/24/2023 CLINICAL DATA:  Evaluate left pleural effusion for loculations and possible chest tube placement. EXAM: CHEST ULTRASOUND COMPARISON:  Chest CT 08/23/2023 FINDINGS: Moderate sized left pleural effusion with scattered septations. There are some large pockets of fluid. IMPRESSION: Moderate sized loculated left pleural effusion. Electronically Signed   By: Elene Griffes M.D.   On: 08/24/2023 14:16   CT Angio Chest PE W and/or Wo Contrast Result Date: 08/23/2023 CLINICAL DATA:  Productive cough for 2 weeks with shortness of breath and chest pain, clinically suspected pulmonary embolism. EXAM: CT ANGIOGRAPHY CHEST WITH CONTRAST TECHNIQUE: Multidetector CT imaging of the chest was performed using the standard protocol during bolus administration of intravenous contrast. Multiplanar CT image reconstructions and MIPs were obtained to evaluate the vascular anatomy.  RADIATION DOSE REDUCTION: This exam was performed according to the departmental dose-optimization program which includes automated exposure control, adjustment of the mA and/or kV according to patient size and/or use of iterative reconstruction technique. CONTRAST:  75mL OMNIPAQUE  IOHEXOL  350 MG/ML SOLN COMPARISON:  The last chest film available for direct comparison is portable chest 10/03/2014. No prior chest CT. The most recent study, with only the report available is a rib series and PA chest from 03/21/2022 performed at [facility] Limited comparison is available with CT abdomen and pelvis with contrast 10/26/2016. FINDINGS: Cardiovascular: The pulmonary arteries are normal in caliber without evidence of arterial emboli or right heart strain. Central pulmonary veins are borderline prominent. The heart has slightly enlarged since 2018, with a small pericardial effusion also new. There are trace single vessel calcific plaques proximal LAD coronary artery. The aorta and great vessels are tortuous but normal in caliber with mild patchy aortic and minimal great vessel calcifications. There is no aortic dissection or stenosis. Mediastinum/Nodes: There are slightly prominent prevascular and left hilar lymph nodes both up to 1.1 cm short axis, precarinal lymph nodes to 1.2 cm, subcarinal nodal complex 1.4 cm, few right hilar lymph nodes up to 1.1 cm. There is no bulky or encasing adenopathy. Thyroid  gland, axillary spaces are unremarkable. Thoracic esophagus is unremarkable. Moderate-to-large sized hiatal hernia is increased in prominence since 2018. Within the hernia there is an intrathoracic gastric remnant status post old gastric bypass with the gastrojejunostomy within the sac. Chronic asymmetric elevation again noted right hemidiaphragm. The trachea and main bronchi are clear. Lungs/Pleura: There is a moderate-sized left pleural effusion. Most of the effusion is either posterior or sub pulmonic. A portion of the fluid  tracks laterally and cephalad and a small portion of the fluid is loculated in the left apex. On the right there is a small layering pleural effusion without loculations. There is compressive atelectasis medial right lower lobe alongside the hiatal hernia. Posterior dependent atelectasis also noted. There is bronchial thickening in  the lower lobes. In the left lower lobe there is scattered subsegmental bronchial impaction and there is consolidation or compressive collapse of the basal segments as well as of the dorsal lingula. On the right, there is a roughly rhomboid shaped well-circumscribed apical subpleural nodule containing an ectatic bronchiole and measuring 1 x 0.8 cm. This may represent a nodular scar but is indeterminate. Two other tiny nodules in the posterior segment of the right upper lobe, both measuring 3 mm, were noted on 6:41 and 6:58. The rest of the lungs are generally clear. Upper Abdomen: No acute abnormality. Abdominal aortic atherosclerosis. Musculoskeletal: Reverse S shaped thoracolumbar scoliosis noted with osteopenia and thoracic spondylosis. Multilevel degenerative discs. No acute or other significant osseous findings. Unremarkable visualized chest wall. The Review of the MIP images confirms the above findings. IMPRESSION: 1. No evidence of arterial emboli or right heart strain. 2. Slight cardiomegaly with small pericardial effusion. Borderline prominent pulmonary veins. 3. Aortic and trace coronary artery atherosclerosis. 4. Moderate-sized left pleural effusion, mostly posterior or subpulmonic, with a small portion of the fluid loculated in the left apex. 5. Small right pleural effusion without loculations. Atelectasis right lower lobe. 6. Bronchial thickening in the lower lobes with scattered subsegmental bronchial impaction in the left lower lobe, and consolidation or compressive collapse of the left lower lobe basal segments and dorsal lingula. 7. 1 x 0.8 cm right apical subpleural  nodule containing an ectatic bronchiole. This may represent a nodular scar but is indeterminate. Per Fleischner Society Guidelines, consider a non-contrast Chest CT at 3 months, a PET/CT, or tissue sampling. These guidelines do not apply to immunocompromised patients and patients with cancer. Follow up in patients with significant comorbidities as clinically warranted. For lung cancer screening, adhere to Lung-RADS guidelines. Reference: Radiology. 2017; 284(1):228-43. 8. Two other tiny 3 mm nodules in the posterior segment of the right upper lobe. 9. Osteopenia, scoliosis and degenerative change. Aortic Atherosclerosis (ICD10-I70.0).  Or Electronically Signed   By: Denman Fischer M.D.   On: 08/23/2023 20:51    Scheduled Meds:  buPROPion   300 mg Oral Daily   carvedilol   3.125 mg Oral BID WC   gabapentin   300 mg Oral TID   levothyroxine   75 mcg Oral Q0600   liothyronine   5 mcg Oral Daily   multivitamin with minerals  1 tablet Oral Daily   pantoprazole   40 mg Oral Daily   QUEtiapine   50 mg Oral QHS   rosuvastatin   10 mg Oral Daily   vitamin B-12  300 mcg Oral Daily   Continuous Infusions:  azithromycin  500 mg (08/24/23 1752)   ceFEPime  (MAXIPIME ) IV 2 g (08/25/23 0529)   vancomycin  1,500 mg (08/24/23 2153)     LOS: 2 days  MDM: Patient is high risk for one or more organ failure.  They necessitate ongoing hospitalization for continued IV therapies and subsequent lab monitoring. Total time spent interpreting labs and vitals, reviewing the medical record, coordinating care amongst consultants and care team members, directly assessing and discussing care with the patient and/or family: 55 min  Levent Kornegay, DO [facility]  To contact the attending physician between 7A-7P please use Epic Chat. To contact the covering physician during after hours 7P-7A, please review Amion.  08/25/2023, 8:10 AM   *This document has been created with the assistance of dictation software. Please excuse  typographical errors. *

## 2023-08-25 NOTE — Plan of Care (Signed)
  Problem: Education: Goal: Knowledge of General Education information will improve Description: Including pain rating scale, medication(s)/side effects and non-pharmacologic comfort measures Outcome: Progressing   Problem: Health Behavior/Discharge Planning: Goal: Ability to manage health-related needs will improve Outcome: Progressing   Problem: Activity: Goal: Risk for activity intolerance will decrease Outcome: Progressing   Problem: Nutrition: Goal: Adequate nutrition will be maintained Outcome: Progressing   Problem: Coping: Goal: Level of anxiety will decrease Outcome: Progressing   Problem: Elimination: Goal: Will not experience complications related to bowel motility Outcome: Progressing   Problem: Pain Managment: Goal: General experience of comfort will improve and/or be controlled Outcome: Progressing   Problem: Safety: Goal: Ability to remain free from injury will improve Outcome: Progressing   Problem: Activity: Goal: Ability to tolerate increased activity will improve Outcome: Progressing   Problem: Respiratory: Goal: Ability to maintain adequate ventilation will improve Outcome: Progressing

## 2023-08-25 NOTE — Procedures (Signed)
 Pleural Fibrinolytic Administration Procedure Note  Barbara Mclaughlin  [id number]  Apr 16, 1951  Date:08/25/23  Time:10:25 AM   Provider Performing:Jean-Pierre Lewanda Perea   Procedure: Pleural Fibrinolysis Initial day [id number])  Indication(s) Fibrinolysis of complicated pleural effusion  Consent Risks of the procedure as well as the alternatives and risks of each were explained to the patient and/or caregiver.  Consent for the procedure was obtained.   Anesthesia None  Time Out Verified patient identification, verified procedure, site/side was marked, verified correct patient position, special equipment/implants available, medications/allergies/relevant history reviewed, required imaging and test results available.  Sterile Technique Hand hygiene, gloves  Procedure Description Existing pleural catheter was cleaned and accessed in sterile manner.  10mg  of tPA in 30cc of saline and 5mg  of dornase in 30cc of sterile water  were injected into pleural space using existing pleural catheter.  Catheter will be clamped for 1 hour and then placed back to suction.  Complications/Tolerance None; patient tolerated the procedure well.  EBL None  Specimen(s) None   Barbara Kindler, MD [facility] 08/25/2023 10:26 AM

## 2023-08-25 NOTE — Progress Notes (Signed)
 NAME:  Barbara Mclaughlin, MRN:  [id number], DOB:  May 02, 1951, LOS: 2 ADMISSION DATE:  08/23/2023 History of Present Illness:  This is a case of a pleasant 73 year old female patient with a past medical history of hypertension, hyperlipidemia, postablative hypothyroidism, depression with anxiety, anemia who is presenting to [facility] on 0/17 with ongoing cough shortness of breath and pleuritic chest pain.   She reports to me that about 5 weeks ago she started complaining of shortness of breath associated with cough and few episodes of hemoptysis.  She did not have hemoptysis while in house.  She also developed pleuritic chest pain.   On arrival she was hemodynamically stable elevated WBC count was noted at 22,000. CTA chest revealed a moderate to large left loculated pleural effusion. No PE.    Family history - Parents with COPD who were both smokers    Social history - Never Smoker   Pertinent  Medical History  As above   Significant Hospital Events: Including procedures, antibiotic start and stop dates in addition to other pertinent events   08/25/2023 s/p Left sided chest tube placement with fluid analysis consistent with a complicated parapn vs empyema.   Interim History / Subjective:  Patient has no major complaints S/p CT 04/18    Objective   Blood pressure 115/61, pulse 76, temperature 98.6 F (37 C), temperature source Oral, resp. rate 18, height 5\' 6"  (1.676 m), weight 68 kg, SpO2 92%.        Intake/Output Summary (Last 24 hours) at 08/25/2023 1002 Last data filed at 08/25/2023 0500 Gross per 24 hour  Intake --  Output 1150 ml  Net -1150 ml   Filed Weights   08/23/23 1605  Weight: 68 kg    Examination: General: NAD HENT: Supple neck reactive pupils  Lungs: Diminished at the left base  Cardiovascular: Normal S1, Normal S2, RRR  Abdomen: Soft, non tender, non distended, +BS  Extremities: Warm and well perfused  Chest tube flushed at bedside.   Assessment &  Plan:  This is a case of a pleasant 73 year old female patient with a past medical history of hypertension, hyperlipidemia, postablative hypothyroidism, depression with anxiety, anemia who is presenting to [facility] on 0/17 with ongoing cough shortness of breath and pleuritic chest pain. Found to have a large complex left sided pleural effusion.    #Left complex pleural effusion with septations highly suspicious for empyema (Elevated wbc count, cough with sputum production and pleuritic chest pain). Fluid analysis appears to be consistent with empyema, pending Culture.  #Hemoptysis likely in the setting of CAP vs aspiration pneumonia in the setting of  #Large hiatal hernia    []  CT placement to suction at - 20cmH2O. Will need flushing orders Q6H.  []  First dose  intrapleural lytics this am.   []  Daily CXR []  Pending cytology.  []  Urine strep and legionella, mrsa swab and sputum culture.  []  Would recommend expanding Abx to cover anaerobes.  []  Will decide on bronchoscopy based oin the above results. No signs of hemoptysis in house , H&H stable and no sign of endobronchial lesion on CT chest.    Thank you for the interesting consult.  Pulmonary Team will follow.   I spent 50 minutes caring for this patient today, including preparing to see the patient, obtaining a medical history , reviewing a separately obtained history, performing a medically appropriate examination and/or evaluation, counseling and educating the patient/family/caregiver, ordering medications, tests, or procedures, documenting clinical information in the electronic health record,  and independently interpreting results (not separately reported/billed) and communicating results to the patient/family/caregiver  Annitta Kindler, MD [facility] 08/25/2023 10:25 AM

## 2023-08-25 NOTE — Plan of Care (Signed)
  Problem: Education: Goal: Knowledge of General Education information will improve Description: Including pain rating scale, medication(s)/side effects and non-pharmacologic comfort measures Outcome: Progressing   Problem: Health Behavior/Discharge Planning: Goal: Ability to manage health-related needs will improve Outcome: Progressing   Problem: Clinical Measurements: Goal: Ability to maintain clinical measurements within normal limits will improve Outcome: Progressing   Problem: Activity: Goal: Risk for activity intolerance will decrease Outcome: Progressing   Problem: Nutrition: Goal: Adequate nutrition will be maintained Outcome: Progressing   Problem: Coping: Goal: Level of anxiety will decrease Outcome: Progressing   Problem: Elimination: Goal: Will not experience complications related to bowel motility Outcome: Progressing   Problem: Pain Managment: Goal: General experience of comfort will improve and/or be controlled Outcome: Progressing   Problem: Safety: Goal: Ability to remain free from injury will improve Outcome: Progressing   Problem: Activity: Goal: Ability to tolerate increased activity will improve Outcome: Progressing   Problem: Respiratory: Goal: Ability to maintain adequate ventilation will improve Outcome: Progressing

## 2023-08-26 ENCOUNTER — Inpatient Hospital Stay

## 2023-08-26 DIAGNOSIS — J189 Pneumonia, unspecified organism: Secondary | ICD-10-CM | POA: Diagnosis not present

## 2023-08-26 DIAGNOSIS — J90 Pleural effusion, not elsewhere classified: Secondary | ICD-10-CM | POA: Diagnosis not present

## 2023-08-26 DIAGNOSIS — I10 Essential (primary) hypertension: Secondary | ICD-10-CM | POA: Diagnosis not present

## 2023-08-26 DIAGNOSIS — R042 Hemoptysis: Secondary | ICD-10-CM | POA: Diagnosis not present

## 2023-08-26 DIAGNOSIS — E890 Postprocedural hypothyroidism: Secondary | ICD-10-CM | POA: Diagnosis not present

## 2023-08-26 DIAGNOSIS — K449 Diaphragmatic hernia without obstruction or gangrene: Secondary | ICD-10-CM | POA: Diagnosis not present

## 2023-08-26 LAB — COMPREHENSIVE METABOLIC PANEL WITH GFR
ALT: 11 U/L (ref 0–44)
ALT: 14 U/L (ref 0–44)
AST: 15 U/L (ref 15–41)
AST: 18 U/L (ref 15–41)
Albumin: 2 g/dL — ABNORMAL LOW (ref 3.5–5.0)
Albumin: 2.2 g/dL — ABNORMAL LOW (ref 3.5–5.0)
Alkaline Phosphatase: 79 U/L (ref 38–126)
Alkaline Phosphatase: 83 U/L (ref 38–126)
Anion gap: 11 (ref 5–15)
Anion gap: 7 (ref 5–15)
BUN: 18 mg/dL (ref 8–23)
BUN: 21 mg/dL (ref 8–23)
CO2: 18 mmol/L — ABNORMAL LOW (ref 22–32)
CO2: 18 mmol/L — ABNORMAL LOW (ref 22–32)
Calcium: 8.1 mg/dL — ABNORMAL LOW (ref 8.9–10.3)
Calcium: 8.2 mg/dL — ABNORMAL LOW (ref 8.9–10.3)
Chloride: 101 mmol/L (ref 98–111)
Chloride: 102 mmol/L (ref 98–111)
Creatinine, Ser: 1.01 mg/dL — ABNORMAL HIGH (ref 0.44–1.00)
Creatinine, Ser: 1.14 mg/dL — ABNORMAL HIGH (ref 0.44–1.00)
GFR, Estimated: 59 mL/min — ABNORMAL LOW (ref >60)
GFR, Estimated: 51 mL/min — ABNORMAL LOW (ref >60)
Glucose, Bld: 101 mg/dL — ABNORMAL HIGH (ref 70–99)
Glucose, Bld: 132 mg/dL — ABNORMAL HIGH (ref 70–99)
Potassium: 3.5 mmol/L (ref 3.5–5.1)
Potassium: 3.8 mmol/L (ref 3.5–5.1)
Sodium: 130 mmol/L — ABNORMAL LOW (ref 135–145)
Sodium: 127 mmol/L — ABNORMAL LOW (ref 135–145)
Total Bilirubin: 0.6 mg/dL (ref 0.0–1.2)
Total Bilirubin: 0.6 mg/dL (ref 0.0–1.2)
Total Protein: 5.3 g/dL — ABNORMAL LOW (ref 6.5–8.1)
Total Protein: 5.8 g/dL — ABNORMAL LOW (ref 6.5–8.1)

## 2023-08-26 LAB — CBC WITH DIFFERENTIAL/PLATELET
Abs Immature Granulocytes: 0.58 10*3/uL — ABNORMAL HIGH (ref 0.00–0.07)
Basophils Absolute: 0.2 10*3/uL — ABNORMAL HIGH (ref 0.0–0.1)
Basophils Relative: 0 %
Eosinophils Absolute: 0 10*3/uL (ref 0.0–0.5)
Eosinophils Relative: 0 %
HCT: 34 % — ABNORMAL LOW (ref 36.0–46.0)
Hemoglobin: 11.7 g/dL — ABNORMAL LOW (ref 12.0–15.0)
Immature Granulocytes: 2 %
Lymphocytes Relative: 3 %
Lymphs Abs: 1.1 10*3/uL (ref 0.7–4.0)
MCH: 30.6 pg (ref 26.0–34.0)
MCHC: 34.4 g/dL (ref 30.0–36.0)
MCV: 89 fL (ref 80.0–100.0)
Monocytes Absolute: 2.2 10*3/uL — ABNORMAL HIGH (ref 0.1–1.0)
Monocytes Relative: 7 %
Neutro Abs: 29.8 10*3/uL — ABNORMAL HIGH (ref 1.7–7.7)
Neutrophils Relative %: 88 %
Platelets: 615 10*3/uL — ABNORMAL HIGH (ref 150–400)
RBC: 3.82 MIL/uL — ABNORMAL LOW (ref 3.87–5.11)
RDW: 13.1 % (ref 11.5–15.5)
Smear Review: NORMAL
WBC: 33.8 10*3/uL — ABNORMAL HIGH (ref 4.0–10.5)
nRBC: 0 % (ref 0.0–0.2)

## 2023-08-26 LAB — CBC
HCT: 33.2 % — ABNORMAL LOW (ref 36.0–46.0)
Hemoglobin: 11 g/dL — ABNORMAL LOW (ref 12.0–15.0)
MCH: 30.5 pg (ref 26.0–34.0)
MCHC: 33.1 g/dL (ref 30.0–36.0)
MCV: 92 fL (ref 80.0–100.0)
Platelets: 558 10*3/uL — ABNORMAL HIGH (ref 150–400)
RBC: 3.61 MIL/uL — ABNORMAL LOW (ref 3.87–5.11)
RDW: 13 % (ref 11.5–15.5)
WBC: 34 10*3/uL — ABNORMAL HIGH (ref 4.0–10.5)
nRBC: 0 % (ref 0.0–0.2)

## 2023-08-26 LAB — PHOSPHORUS: Phosphorus: 4.7 mg/dL — ABNORMAL HIGH (ref 2.5–4.6)

## 2023-08-26 LAB — MAGNESIUM: Magnesium: 2.2 mg/dL (ref 1.7–2.4)

## 2023-08-26 LAB — AMMONIA: Ammonia: <10 umol/L (ref 9–35)

## 2023-08-26 MED ORDER — SODIUM CHLORIDE (PF) 0.9 % IJ SOLN
10.0000 mg | Freq: Once | INTRAMUSCULAR | Status: AC
  Administered 2023-08-26: 10 mg via INTRAPLEURAL
  Filled 2023-08-26: qty 10

## 2023-08-26 MED ORDER — DICLOFENAC SODIUM 1 % EX GEL
2.0000 g | Freq: Four times a day (QID) | CUTANEOUS | Status: DC
  Administered 2023-08-26 – 2023-08-28 (×9): 2 g via TOPICAL
  Filled 2023-08-26: qty 100

## 2023-08-26 MED ORDER — CHLORHEXIDINE GLUCONATE CLOTH 2 % EX PADS
6.0000 | MEDICATED_PAD | Freq: Every day | CUTANEOUS | Status: DC
  Administered 2023-08-26 – 2023-08-29 (×4): 6 via TOPICAL

## 2023-08-26 MED ORDER — STERILE WATER FOR INJECTION IJ SOLN
5.0000 mg | Freq: Once | RESPIRATORY_TRACT | Status: AC
  Administered 2023-08-26: 5 mg via INTRAPLEURAL
  Filled 2023-08-26: qty 5

## 2023-08-26 MED ORDER — SODIUM CHLORIDE 0.9 % IV BOLUS
1000.0000 mL | Freq: Once | INTRAVENOUS | Status: AC
  Administered 2023-08-26: 1000 mL via INTRAVENOUS

## 2023-08-26 MED ORDER — SENNOSIDES-DOCUSATE SODIUM 8.6-50 MG PO TABS
1.0000 | ORAL_TABLET | Freq: Every day | ORAL | Status: DC
  Administered 2023-08-26 – 2023-08-28 (×2): 1 via ORAL
  Filled 2023-08-26 (×3): qty 1

## 2023-08-26 NOTE — Progress Notes (Signed)
 Mobility Specialist - Progress Note     08/26/23 1731  Mobility  Activity Stood at bedside;Transferred from chair to bed;Dangled on edge of bed  Level of Assistance Minimal assist, patient does 75% or more  Assistive Device Front wheel walker  Distance Ambulated (ft) 3 ft  Range of Motion/Exercises Active  Activity Response Tolerated well  Mobility Referral Yes  Mobility visit 1 Mobility   Pt resting in recliner on RA upon entry. Pt STS and transfers to bed MinA with RW. Pt given constant verbal commands to remain wake due to drowsiness. Pt left in bed with needs in reach and bed alarm activated. Bladder scanned performed by RN. Husband present at bedside.   Jerri Morale Mobility Specialist 08/26/23, 5:40 PM

## 2023-08-26 NOTE — Procedures (Signed)
 Pleural Fibrinolytic Administration Procedure Note  KASARA SCHOMER  [id number]  [birth year]  Date:08/26/23  Time:10:07 AM   Provider Performing:Jean-Pierre Sofi Bryars   Procedure: Pleural Fibrinolysis Subsequent day [id number])  Indication(s) Fibrinolysis of complicated pleural effusion  Consent Risks of the procedure as well as the alternatives and risks of each were explained to the patient and/or caregiver.  Consent for the procedure was obtained.  Anesthesia None  Time Out Verified patient identification, verified procedure, site/side was marked, verified correct patient position, special equipment/implants available, medications/allergies/relevant history reviewed, required imaging and test results available.   Sterile Technique Hand hygiene, gloves   Procedure Description Existing pleural catheter was cleaned and accessed in sterile manner.  10mg  of tPA in 30cc of saline and 5mg  of dornase in 30cc of sterile water  were injected into pleural space using existing pleural catheter.  Catheter will be clamped for 1 hour and then placed back to suction.   Complications/Tolerance None; patient tolerated the procedure well.  EBL None  Specimen(s) None   Annitta Kindler, MD [facility] 08/26/2023 10:08 AM

## 2023-08-26 NOTE — Progress Notes (Addendum)
 PROGRESS NOTE    Barbara Mclaughlin  WUJ:[id number] DOB: 09/06/1950 DOA: 08/23/2023 PCP: Yehuda Helms, MD  Chief Complaint  Patient presents with   Cough    Hospital Course:  Barbara Mclaughlin is 73 y.o. female with hypertension, hyperlipidemia, postablative hypothyroidism, depression, anxiety, anemia, history of SVT, acoustic neuroma, who presents with cough, shortness of breath, chest pain.  Patient reports persistent dry cough for the last 5 weeks but recently began having hemoptysis..  Patient is scheduled for an outpatient SVT ablation by her cardiologist on 4/29.  He has given her prescription of prophylaxis amoxicillin  which she took for 3 days prior to arrival.  Patient was seen at [facility] clinic on 4/17 and had chest x-ray performed which revealed large left-sided pleural effusion and she was sent to ED for further evaluation.  In the ED she was found have a leukocytosis of 22.4, BNP 117, lactic acid 1.2, Pro-Cal 0.39.  Vital signs were stable.  O2 sat 95% on room air.  CTA was performed which revealed no pulmonary embolism.  CT did reveal cardiomegaly with small pericardial effusion, moderately sized left pleural effusion mostly posterior or subpulmonic with a small portion of fluid in the left apex.  Also revealed a right pleural effusion without loculations and bronchial thickening in the lower lobes with scattered subsegmental bronchial impaction of left lower lobe and consolidation or compressive collapse of the left lower lobe basal segments and dorsal lingula.  A 1 x 0.8 cm right apical subpleural nodule containing ectatic bronchial was also noted as well as 3 other tiny nodules in the posterior segment of the right upper lobe.  She was admitted and initiated on antibiotics.    Subjective: Patient developed sudden chest pain late yesterday afternoon.  She received pain medication.  Stat chest x-ray, EKG and Troponin were all nonconcerning.  This morning chest pain has  improved.  She is still receiving as needed pain medications.  She is requiring morphine  and Percocet. She is very drowsy on my arrival.  She reports this is secondary to the pain medication.  She is feeling better than yesterday but now is endorsing some left-sided shoulder pain.  She has requested Voltaren  gel for this  Objective: Vitals:   08/26/23 0139 08/26/23 0150 08/26/23 0357 08/26/23 0430  BP: (!) 89/62 94/62 (!) 92/53 102/65  Pulse: 88  75   Resp: 16  18   Temp: 97.9 F (36.6 C)  98 F (36.7 C)   TempSrc:      SpO2: 100%  96%   Weight:      Height:        Intake/Output Summary (Last 24 hours) at 08/26/2023 0809 Last data filed at 08/26/2023 0600 Gross per 24 hour  Intake 330 ml  Output 550 ml  Net -220 ml   Filed Weights   08/23/23 1605  Weight: 68 kg    Examination: General exam: Appears calm and comfortable, NAD  Respiratory system: No work of breathing, decreased aeration left side, chest tube in place with sanguinous fluid in canister.  Significantly tender to palpation over chest tube insertion site. Cardiovascular system: S1 & S2 heard, RRR.  Gastrointestinal system: Abdomen is nondistended, soft and nontender.  Neuro: Rousey but arousable.  Oriented x 3  extremities: Symmetric, expected ROM Skin: No rashes, lesions Psychiatry: Mood & affect appropriate for situation.   Assessment & Plan:  Principal Problem:   Pleural effusion Active Problems:   CAP (community acquired pneumonia)   Lung nodule  Hypertension   SVT (supraventricular tachycardia) (HCC)   Hypothyroidism   HLD (hyperlipidemia)   Anemia, iron deficiency   Depression with anxiety    Left, loculated pleural effusion Hemoptysis - Loculate pleural effusion of moderate size seen on CT.  Etiology unclear.  Malignancy versus empyema.  Patient has has associated hemoptysis.  CT negative for PE.  Consolidation also seen in LLL - Hemodynamically stable on arrival, procal 0.39, afebrile.  WBC  22 - Status post chest tube placement with IR 4/18 and  - Pulmonology consulted to manage, intrapleural lytics 4/19 and 4/20 - Pleural cultures at this time without growth.  Predominantly PMNS.  Continue to follow - Concern for empyema so we will continue with broad-spectrum antibiotics.  Currently on azithromycin , cefepime , Flagyl . - MRSA PCR negative, vancomycin  de-escalated on 4/19. - Cytology sent.  Follow closely.  No smoking history per patient, both parents smokers -- ANA/RF/CCP pending - Strep pneumo negative, Legionella pending. - Sputum cultures ordered but does not appear that they were obtained. - Respiratory viral panel negative - Blood cultures negative x 3 days.  Left-sided chest pain - Sudden onset chest pain 4/19.  Chest x-ray without acute changes.  Troponin 7, EKG not concerning. - Patient placed back on telemetry.  Continue to monitor closely. - Chest pain likely secondary to chest tube.  Have titrated pain medications.  Declining kidney function - Baseline creatinine 0.78, has risen to 1.01 this morning.  Does not meet criteria for AKI at this time. - Continue to monitor CMP closely - Does not appear dry at this time.  Encourage oral hydration - Avoid nephrotoxic medications - Have discontinued vancomycin  4/19.  Lung nodules - Repeat CT in 3 months  Hyponatremia Hypokalemia - Continue to monitor and replace as needed  Hypertension - Resume home meds - As needed hydralazine  labetalol  SVT -Monitoring on telemetry, currently stable - Continue with home dose Coreg  - Patient is scheduled for SVT ablation with cardiology outpatient 4/29  Hypothyroidism - Continue liothyronine   Hyperlipidemia - Continue statin  Anemia, iron deficiency - Hemoglobin currently stable near baseline - Continue to trend CBC - Transfuse if needed  Depression with anxiety - Continue home meds   **Addendum: Patient has been increasingly drowsy today.  She is oriented and  able to answer all questions but requires significant prompting.  Repeat labs obtained which show worsening WBC, thrombocytosis, worsening hyponatremia.  Blood pressure is low but MAP is consistently above 65.  Will initiate NS bolus followed by maintenance IV fluids.  She is currently on broad-spectrum antibiotics without fever.  Have initiated autoimmune workup. Dc'd gabapentin .  DVT prophylaxis: SCDs   Code Status: Limited: Do not attempt resuscitation (DNR) -DNR-LIMITED -Do Not Intubate/DNI  Disposition: Inpatient, still with chest tube.  Workup ongoing Consultants:  Interventional radiology Pulmonology  Procedures:    Antimicrobials:  Anti-infectives (From admission, onward)    Start     Dose/Rate Route Frequency Ordered Stop   08/25/23 1200  metroNIDAZOLE  (FLAGYL ) IVPB 500 mg        500 mg 100 mL/hr over 60 Minutes Intravenous Every 12 hours 08/25/23 1041     08/24/23 2000  vancomycin  (VANCOREADY) IVPB 1500 mg/300 mL  Status:  Discontinued        1,500 mg 150 mL/hr over 120 Minutes Intravenous Every 24 hours 08/23/23 2040 08/25/23 1650   08/24/23 1700  azithromycin  (ZITHROMAX ) 500 mg in sodium chloride  0.9 % 250 mL IVPB        500 mg  250 mL/hr over 60 Minutes Intravenous Every 24 hours 08/23/23 2001     08/24/23 0600  ceFEPIme  (MAXIPIME ) 2 g in sodium chloride  0.9 % 100 mL IVPB        2 g 200 mL/hr over 30 Minutes Intravenous Every 12 hours 08/23/23 2017     08/23/23 2045  vancomycin  (VANCOREADY) IVPB 500 mg/100 mL        500 mg 100 mL/hr over 60 Minutes Intravenous  Once 08/23/23 2031 08/23/23 2321   08/23/23 1700  vancomycin  (VANCOCIN ) IVPB 1000 mg/200 mL premix        1,000 mg 200 mL/hr over 60 Minutes Intravenous  Once 08/23/23 1646 08/23/23 2130   08/23/23 1700  ceFEPIme  (MAXIPIME ) 2 g in sodium chloride  0.9 % 100 mL IVPB        2 g 200 mL/hr over 30 Minutes Intravenous  Once 08/23/23 1646 08/23/23 1825   08/23/23 1700  azithromycin  (ZITHROMAX ) 500 mg in sodium  chloride 0.9 % 250 mL IVPB        500 mg 250 mL/hr over 60 Minutes Intravenous  Once 08/23/23 1646 08/23/23 2031       Data Reviewed: I have personally reviewed following labs and imaging studies CBC: Recent Labs  Lab 08/23/23 1607 08/24/23 0506 08/25/23 0850 08/26/23 0424  WBC 22.4* 24.4* 17.3* 34.0*  NEUTROABS 19.4*  --  13.8*  --   HGB 11.1* 9.6* 9.5* 11.0*  HCT 34.2* 28.2* 28.4* 33.2*  MCV 94.0 89.5 89.9 92.0  PLT 501* 424* 461* 558*   Basic Metabolic Panel: Recent Labs  Lab 08/23/23 1607 08/24/23 0506 08/25/23 0325 08/25/23 0850 08/26/23 0424  NA 132* 132*  --  132* 130*  K 3.9 3.4*  --  3.3* 3.5  CL 103 103  --  105 101  CO2 21* 20*  --  20* 18*  GLUCOSE 102* 99  --  90 101*  BUN 14 13  --  14 18  CREATININE 0.78 0.73 0.79 0.78 1.01*  CALCIUM  8.8* 8.4*  --  8.3* 8.1*  MG  --   --   --   --  2.2  PHOS  --   --   --   --  4.7*   GFR: Estimated Creatinine Clearance: 46.4 mL/min (A) (by C-G formula based on SCr of 1.01 mg/dL (H)). Liver Function Tests: Recent Labs  Lab 08/23/23 1607 08/25/23 0850 08/26/23 0424  AST 22 19 15   ALT 15 14 11   ALKPHOS 90 87 79  BILITOT 1.0 0.5 0.6  PROT 7.1 5.7* 5.3*  ALBUMIN 2.9* 2.3* 2.0*   CBG: No results for input(s): "GLUCAP" in the last 168 hours.  Recent Results (from the past 240 hours)  Blood culture (routine x 2)     Status: None (Preliminary result)   Collection Time: 08/23/23  5:45 PM   Specimen: BLOOD  Result Value Ref Range Status   Specimen Description BLOOD LEFT ANTECUBITAL  Final   Special Requests   Final    BOTTLES DRAWN AEROBIC AND ANAEROBIC Blood Culture adequate volume   Culture   Final    NO GROWTH 3 DAYS Performed at [facility], [address] [place], [place] [address]    Report Status PENDING  Incomplete  Blood culture (routine x 2)     Status: None (Preliminary result)   Collection Time: 08/23/23  5:45 PM   Specimen: BLOOD  Result Value Ref Range Status   Specimen  Description BLOOD BLOOD RIGHT WRIST  Final   Special Requests   Final    BOTTLES DRAWN AEROBIC AND ANAEROBIC Blood Culture results may not be optimal due to an inadequate volume of blood received in culture bottles   Culture   Final    NO GROWTH 3 DAYS Performed at [facility], [address] [place], [place] [address]    Report Status PENDING  Incomplete  Resp panel by RT-PCR (RSV, Flu A&B, Covid) Anterior Nasal Swab     Status: None   Collection Time: 08/23/23  8:45 PM   Specimen: Anterior Nasal Swab  Result Value Ref Range Status   SARS Coronavirus 2 by RT PCR NEGATIVE NEGATIVE Final    Comment: (NOTE) SARS-CoV-2 target nucleic acids are NOT DETECTED.  The SARS-CoV-2 RNA is generally detectable in upper respiratory specimens during the acute phase of infection. The lowest concentration of SARS-CoV-2 viral copies this assay can detect is 138 copies/mL. A negative result does not preclude SARS-Cov-2 infection and should not be used as the sole basis for treatment or other patient management decisions. A negative result may occur with  improper specimen collection/handling, submission of specimen other than nasopharyngeal swab, presence of viral mutation(s) within the areas targeted by this assay, and inadequate number of viral copies(<138 copies/mL). A negative result must be combined with clinical observations, patient history, and epidemiological information. The expected result is Negative.  Fact Sheet for Patients:  BloggerCourse.com  Fact Sheet for Healthcare Providers:  SeriousBroker.it  This test is no t yet approved or cleared by the [place]  FDA and  has been authorized for detection and/or diagnosis of SARS-CoV-2 by FDA under an Emergency Use Authorization (EUA). This EUA will remain  in effect (meaning this test can be used) for the duration of the COVID-19 declaration under Section 564(b)(1) of the  Act, 21 U.S.C.section 360bbb-3(b)(1), unless the authorization is terminated  or revoked sooner.       Influenza A by PCR NEGATIVE NEGATIVE Final   Influenza B by PCR NEGATIVE NEGATIVE Final    Comment: (NOTE) The Xpert Xpress SARS-CoV-2/FLU/RSV plus assay is intended as an aid in the diagnosis of influenza from Nasopharyngeal swab specimens and should not be used as a sole basis for treatment. Nasal washings and aspirates are unacceptable for Xpert Xpress SARS-CoV-2/FLU/RSV testing.  Fact Sheet for Patients: BloggerCourse.com  Fact Sheet for Healthcare Providers: SeriousBroker.it  This test is not yet approved or cleared by the [place]  FDA and has been authorized for detection and/or diagnosis of SARS-CoV-2 by FDA under an Emergency Use Authorization (EUA). This EUA will remain in effect (meaning this test can be used) for the duration of the COVID-19 declaration under Section 564(b)(1) of the Act, 21 U.S.C. section 360bbb-3(b)(1), unless the authorization is terminated or revoked.     Resp Syncytial Virus by PCR NEGATIVE NEGATIVE Final    Comment: (NOTE) Fact Sheet for Patients: BloggerCourse.com  Fact Sheet for Healthcare Providers: SeriousBroker.it  This test is not yet approved or cleared by the [place]  FDA and has been authorized for detection and/or diagnosis of SARS-CoV-2 by FDA under an Emergency Use Authorization (EUA). This EUA will remain in effect (meaning this test can be used) for the duration of the COVID-19 declaration under Section 564(b)(1) of the Act, 21 U.S.C. section 360bbb-3(b)(1), unless the authorization is terminated or revoked.  Performed at [facility], [address] [place], [place] [address]   Body fluid culture w Gram Stain     Status: None (Preliminary  result)   Collection Time: 08/24/23  9:41 AM   Specimen:  Pleura; Body Fluid  Result Value Ref Range Status   Specimen Description   Final    PLEURAL Performed at [facility], [address] [place], [place] [address]    Special Requests   Final    NONE Performed at [facility], [address] [place], [place] [address]    Gram Stain   Final    RARE WBC PRESENT, PREDOMINANTLY PMN NO ORGANISMS SEEN    Culture   Final    NO GROWTH < 12 HOURS Performed at [facility], [address] [place], [place] [address]    Report Status PENDING  Incomplete  MRSA Next Gen by PCR, Nasal     Status: None   Collection Time: 08/25/23  3:05 PM   Specimen: Nasal Mucosa; Nasal Swab  Result Value Ref Range Status   MRSA by PCR Next Gen NOT DETECTED NOT DETECTED Final    Comment: (NOTE) The GeneXpert MRSA Assay (FDA approved for NASAL specimens only), is one component of a comprehensive MRSA colonization surveillance program. It is not intended to diagnose MRSA infection nor to guide or monitor treatment for MRSA infections. Test performance is not FDA approved in patients less than 53 years old. Performed at [facility], [address], [place], [place] [address]      Radiology Studies: DG Chest [place] 1 View Result Date: 08/25/2023 CLINICAL DATA:  [id number] Chest tube in place [id number] EXAM: PORTABLE CHEST - 1 VIEW COMPARISON:  08/25/2023 FINDINGS: Stable pigtail chest tube at the left lung base. No pneumothorax. Persistent left effusion. Patchy opacities at the lung bases left greater than right stable. Heart size and mediastinal contours are within normal limits. Visualized bones unremarkable. IMPRESSION: 1. Stable left chest tube. No pneumothorax. 2. Persistent left effusion and bibasilar opacities. Electronically Signed   By: Nicoletta Barrier M.D.   On: 08/25/2023 18:39   DG Chest Port 1 View Result Date: 08/25/2023 CLINICAL DATA:  Chest tube.  Follow-up. EXAM: PORTABLE CHEST 1 VIEW COMPARISON:  10/03/2014 radiography.  Images from catheter placement yesterday. CT 08/23/2023. FINDINGS: Heart size is normal. Chronic hiatal hernia again shown. Aortic atherosclerosis and tortuosity. Mild chronic markings in the right lung but no significant finding. Pleural catheter in place on the left at the inferior pleural space. Probable slight reduction in pleural density when compared to the CT scan. No visible pleural air. IMPRESSION: 1. Pleural catheter in place on the left at the inferior pleural space. Probable slight reduction in pleural density when compared to the CT scan. No visible pleural air. 2. Chronic hiatal hernia. Aortic atherosclerosis and tortuosity. Electronically Signed   By: Bettylou Brunner M.D.   On: 08/25/2023 11:42   IR PERC PLEURAL DRAIN W/INDWELL CATH W/IMG GUIDE Result Date: 08/24/2023 INDICATION: 73 year old presented with productive cough and shortness of breath. Chest CT demonstrated a loculated left pleural effusion. Request for left chest tube placement. EXAM: IMAGE GUIDED PLACEMENT OF LEFT CHEST TUBE MEDICATIONS: Moderate sedation ANESTHESIA/SEDATION: Moderate (conscious) sedation was employed during this procedure. A total of Versed  2 mg and Fentanyl  100 mcg was administered intravenously by the radiology nurse. Total intra-service moderate Sedation Time: 18 minutes. The patient's level of consciousness and vital signs were monitored continuously by radiology nursing throughout the procedure under my direct supervision. COMPLICATIONS: None immediate. FLUOROSCOPY TIME:  Radiation Exposure Index (as provided by the fluoroscopic device): 1.9 mGy Kerma PROCEDURE: Informed written consent  was obtained from the patient after a thorough discussion of the procedural risks, benefits and alternatives. All questions were addressed. Maximal Sterile Barrier Technique was utilized including caps, mask, sterile gowns, sterile gloves, sterile drape, hand hygiene and skin antiseptic. A timeout was performed prior to the  initiation of the procedure. Patient was placed supine on the interventional table. The left mid axillary region was prepped and draped in sterile fashion. Ultrasound demonstrated loculated left pleural effusion. Left mid axillary region was anesthetized using 1% lidocaine . A small incision was made. Using ultrasound guidance, a Yueh catheter was directed into the loculated pleural fluid and straw-colored fluid was aspirated. Superstiff Amplatz wire was advanced into the pleural space and the tract was dilated to accommodate a 14 [place] multipurpose drain. Fluid was collected for labs. Chest tube was attached to a chest drainage system. Chest tube was sutured to the skin. Dressing was placed. Fluoroscopic and ultrasound images were taken and saved for documentation. FINDINGS: Ultrasound demonstrated a loculated left pleural effusion. Large fluid pocket along the posterior left mid axillary region was targeted. Straw-colored fluid was aspirated. Chest tube was in the lower portion of the left chest at the end of the procedure. IMPRESSION: Image guided placement of a left chest tube. Electronically Signed   By: Elene Griffes M.D.   On: 08/24/2023 16:41   US  CHEST (PLEURAL EFFUSION) Result Date: 08/24/2023 CLINICAL DATA:  Evaluate left pleural effusion for loculations and possible chest tube placement. EXAM: CHEST ULTRASOUND COMPARISON:  Chest CT 08/23/2023 FINDINGS: Moderate sized left pleural effusion with scattered septations. There are some large pockets of fluid. IMPRESSION: Moderate sized loculated left pleural effusion. Electronically Signed   By: Elene Griffes M.D.   On: 08/24/2023 14:16    Scheduled Meds:  baclofen   5 mg Oral TID   buPROPion   300 mg Oral Daily   carvedilol   3.125 mg Oral BID WC   gabapentin   300 mg Oral TID   levothyroxine   75 mcg Oral Q0600   liothyronine   5 mcg Oral Daily   multivitamin with minerals  1 tablet Oral Daily   pantoprazole   40 mg Oral Daily   QUEtiapine   50 mg Oral QHS    rosuvastatin   10 mg Oral Daily   sodium chloride  flush  10 mL Intrapleural Q8H   vitamin B-12  300 mcg Oral Daily   Continuous Infusions:  azithromycin  500 mg (08/25/23 1712)   ceFEPime  (MAXIPIME ) IV 2 g (08/26/23 0551)   metronidazole  500 mg (08/26/23 0029)     LOS: 3 days  MDM: Patient is high risk for one or more organ failure.  They necessitate ongoing hospitalization for continued IV therapies and subsequent lab monitoring. Total time spent interpreting labs and vitals, reviewing the medical record, coordinating care amongst consultants and care team members, directly assessing and discussing care with the patient and/or family: 55 min  Damilola Flamm, DO [facility]  To contact the attending physician between 7A-7P please use Epic Chat. To contact the covering physician during after hours 7P-7A, please review Amion.  08/26/2023, 8:09 AM   *This document has been created with the assistance of dictation software. Please excuse typographical errors. *

## 2023-08-26 NOTE — Progress Notes (Signed)
 Pt mostly drowsy throughout shift. Required frequent verbal commands to remain awake, often observed falling asleep in mid-conversation. MD Dezii aware, assessed pt at bedside. See new orders.

## 2023-08-26 NOTE — Progress Notes (Signed)
 PT Cancellation Note  Patient Details Name: DULCY SIDA MRN: [id number] DOB: 04-25-1951   Cancelled Treatment:     PT evaluation and treatment not completed 2/2  Pt lethargic, confused, sleepy and unable to remain awake.  More lab work to follow as per Nurse. So pt deferred. Will attempt again tomorrow.  Cacie Gaskins H Lasasha Brophy 08/26/2023, 2:51 PM

## 2023-08-26 NOTE — Progress Notes (Signed)
 This RN went in with Starling Eck, RN to complete bedside report. Upon entering room, pt was woken up by this RN to be introduced to oncoming RN. Pt became more confused and tearful. Pt stated she said she didn't realize she was in the hospital and that it had been "3 days." Pt also stated that she forgot she had a bath today and worked with mobility. Therapeutic communication provided by this RN to pt and family. MD Alva Jewels notified, nightshift RN Starling Eck made aware.

## 2023-08-26 NOTE — Progress Notes (Signed)
 NAME:  Barbara Mclaughlin, MRN:  [id number], DOB:  05-19-1950, LOS: 3 ADMISSION DATE:  08/23/2023 History of Present Illness:  This is a case of a pleasant 73 year old female patient with a past medical history of hypertension, hyperlipidemia, postablative hypothyroidism, depression with anxiety, anemia who is presenting to [facility] on 0/17 with ongoing cough shortness of breath and pleuritic chest pain.   She reports to me that about 5 weeks ago she started complaining of shortness of breath associated with cough and few episodes of hemoptysis.  She did not have hemoptysis while in house.  She also developed pleuritic chest pain.   On arrival she was hemodynamically stable elevated WBC count was noted at 22,000. CTA chest revealed a moderate to large left loculated pleural effusion. No PE.    Family history - Parents with COPD who were both smokers    Social history - Never Smoker   Pertinent  Medical History  As above   Significant Hospital Events: Including procedures, antibiotic start and stop dates in addition to other pertinent events   08/25/2023 s/p Left sided chest tube placement with fluid analysis consistent with a complicated parapn vs empyema.   Interim History / Subjective:  Patient appears more lethargic today.  Drained 2L Through the Chest tube.  CXR with persistent RLL opacity.   Objective   Blood pressure 134/69, pulse 78, temperature 98.2 F (36.8 C), resp. rate 16, height 5\' 6"  (1.676 m), weight 68 kg, SpO2 97%.        Intake/Output Summary (Last 24 hours) at 08/26/2023 0958 Last data filed at 08/26/2023 0900 Gross per 24 hour  Intake 330 ml  Output 750 ml  Net -420 ml   Filed Weights   08/23/23 1605  Weight: 68 kg    Examination: General: NAD, a bit more lethargic today.  HENT: Supple neck reactive pupils  Lungs: Diminished at the left base  Cardiovascular: Normal S1, Normal S2, RRR  Abdomen: Soft, non tender, non distended, +BS  Extremities: Warm  and well perfused  Chest tube flushed at bedside.   Assessment & Plan:  This is a case of a pleasant 73 year old female patient with a past medical history of hypertension, hyperlipidemia, postablative hypothyroidism, depression with anxiety, anemia who is presenting to [facility] on 0/17 with ongoing cough shortness of breath and pleuritic chest pain. Found to have a large complex left sided pleural effusion.    #Left complex pleural effusion with septations highly suspicious for empyema (Elevated wbc count, cough with sputum production and pleuritic chest pain). Fluid analysis appears to be consistent with empyema, pending Culture.  #Hemoptysis likely in the setting of CAP vs aspiration pneumonia in the setting of  #Large hiatal hernia    []  CT placement to suction at - 20cmH2O. Will need flushing orders Q6H.  []  Second dose  intrapleural lytics this am.   []  Daily CXR []  Pending cytology. Body fluid Culture (No growth so far)  []  Urine strep and legionella, mrsa swab (-) and sputum culture.  []  c/w Abx and oain management per primary team.   []  Will decide on bronchoscopy based oin the above results. No signs of hemoptysis in house , H&H stable and no sign of endobronchial lesion on CT chest.    Thank you for the interesting consult.  Pulmonary Team will follow.   I spent 50 minutes caring for this patient today, including preparing to see the patient, obtaining a medical history , reviewing a separately obtained history, performing a  medically appropriate examination and/or evaluation, counseling and educating the patient/family/caregiver, ordering medications, tests, or procedures, documenting clinical information in the electronic health record, and independently interpreting results (not separately reported/billed) and communicating results to the patient/family/caregiver  Annitta Kindler, MD [facility] 08/26/2023 9:58 AM

## 2023-08-26 NOTE — Plan of Care (Signed)

## 2023-08-27 DIAGNOSIS — J90 Pleural effusion, not elsewhere classified: Secondary | ICD-10-CM | POA: Diagnosis not present

## 2023-08-27 LAB — COMPREHENSIVE METABOLIC PANEL WITH GFR
ALT: 14 U/L (ref 0–44)
AST: 21 U/L (ref 15–41)
Albumin: 1.9 g/dL — ABNORMAL LOW (ref 3.5–5.0)
Alkaline Phosphatase: 76 U/L (ref 38–126)
Anion gap: 8 (ref 5–15)
BUN: 20 mg/dL (ref 8–23)
CO2: 17 mmol/L — ABNORMAL LOW (ref 22–32)
Calcium: 7.8 mg/dL — ABNORMAL LOW (ref 8.9–10.3)
Chloride: 107 mmol/L (ref 98–111)
Creatinine, Ser: 0.98 mg/dL (ref 0.44–1.00)
GFR, Estimated: >60 mL/min (ref >60)
Glucose, Bld: 93 mg/dL (ref 70–99)
Potassium: 3.3 mmol/L — ABNORMAL LOW (ref 3.5–5.1)
Sodium: 132 mmol/L — ABNORMAL LOW (ref 135–145)
Total Bilirubin: 0.6 mg/dL (ref 0.0–1.2)
Total Protein: 5.2 g/dL — ABNORMAL LOW (ref 6.5–8.1)

## 2023-08-27 LAB — CBC WITH DIFFERENTIAL/PLATELET
Abs Immature Granulocytes: 0.62 10*3/uL — ABNORMAL HIGH (ref 0.00–0.07)
Basophils Absolute: 0.1 10*3/uL (ref 0.0–0.1)
Basophils Relative: 1 %
Eosinophils Absolute: 0.2 10*3/uL (ref 0.0–0.5)
Eosinophils Relative: 1 %
HCT: 30.4 % — ABNORMAL LOW (ref 36.0–46.0)
Hemoglobin: 10.4 g/dL — ABNORMAL LOW (ref 12.0–15.0)
Immature Granulocytes: 3 %
Lymphocytes Relative: 4 %
Lymphs Abs: 0.9 10*3/uL (ref 0.7–4.0)
MCH: 30.9 pg (ref 26.0–34.0)
MCHC: 34.2 g/dL (ref 30.0–36.0)
MCV: 90.2 fL (ref 80.0–100.0)
Monocytes Absolute: 1.7 10*3/uL — ABNORMAL HIGH (ref 0.1–1.0)
Monocytes Relative: 7 %
Neutro Abs: 20.3 10*3/uL — ABNORMAL HIGH (ref 1.7–7.7)
Neutrophils Relative %: 84 %
Platelets: 556 10*3/uL — ABNORMAL HIGH (ref 150–400)
RBC: 3.37 MIL/uL — ABNORMAL LOW (ref 3.87–5.11)
RDW: 13.1 % (ref 11.5–15.5)
WBC: 24 10*3/uL — ABNORMAL HIGH (ref 4.0–10.5)
nRBC: 0 % (ref 0.0–0.2)

## 2023-08-27 LAB — PHOSPHORUS: Phosphorus: 3.5 mg/dL (ref 2.5–4.6)

## 2023-08-27 LAB — MAGNESIUM: Magnesium: 2.2 mg/dL (ref 1.7–2.4)

## 2023-08-27 LAB — ANA W/REFLEX IF POSITIVE: Anti Nuclear Antibody (ANA): NEGATIVE

## 2023-08-27 MED ORDER — SODIUM CHLORIDE (PF) 0.9 % IJ SOLN
10.0000 mg | Freq: Once | INTRAMUSCULAR | Status: AC
  Administered 2023-08-27: 10 mg via INTRAPLEURAL
  Filled 2023-08-27: qty 10

## 2023-08-27 MED ORDER — STERILE WATER FOR INJECTION IJ SOLN
5.0000 mg | Freq: Once | RESPIRATORY_TRACT | Status: AC
  Administered 2023-08-27: 5 mg via INTRAPLEURAL
  Filled 2023-08-27: qty 5

## 2023-08-27 MED ORDER — POTASSIUM CHLORIDE CRYS ER 20 MEQ PO TBCR
40.0000 meq | EXTENDED_RELEASE_TABLET | Freq: Once | ORAL | Status: AC
  Administered 2023-08-27: 40 meq via ORAL
  Filled 2023-08-27: qty 2

## 2023-08-27 NOTE — Progress Notes (Signed)
 NAME:  Barbara Mclaughlin, MRN:  [id number], DOB:  Jun 29, 1950, LOS: 4 ADMISSION DATE:  08/23/2023 History of Present Illness:  This is a case of a pleasant 73 year old female patient with a past medical history of hypertension, hyperlipidemia, postablative hypothyroidism, depression with anxiety, anemia who is presenting to [facility] on 0/17 with ongoing cough shortness of breath and pleuritic chest pain.   She reports to me that about 5 weeks ago she started complaining of shortness of breath associated with cough and few episodes of hemoptysis.  She did not have hemoptysis while in house.  She also developed pleuritic chest pain.   On arrival she was hemodynamically stable elevated WBC count was noted at 22,000. CTA chest revealed a moderate to large left loculated pleural effusion. No PE.    Family history - Parents with COPD who were both smokers    Social history - Never Smoker   Pertinent  Medical History  As above   Significant Hospital Events: Including procedures, antibiotic start and stop dates in addition to other pertinent events   08/25/2023 s/p Left sided chest tube placement with fluid analysis consistent with a complicated parapn vs empyema.  08/27/23- patient for 3rd tpa/dornase treatment today. Leukocytosis is improving. She is on room air with normoxia.  Chest tube >440 cc drainage.  I met with family at bedside and reviewed care plan.   Interim History / Subjective:  Patient appears more lethargic today.  Drained 2L Through the Chest tube.  CXR with persistent RLL opacity.   Objective   Blood pressure 122/71, pulse 88, temperature 98.2 F (36.8 C), temperature source Oral, resp. rate 16, height 5\' 6"  (1.676 m), weight 68 kg, SpO2 98%.        Intake/Output Summary (Last 24 hours) at 08/27/2023 1723 Last data filed at 08/27/2023 1500 Gross per 24 hour  Intake 827.47 ml  Output 700 ml  Net 127.47 ml   Filed Weights   08/23/23 1605  Weight: 68 kg     Examination: General: NAD, a bit more lethargic today.  HENT: Supple neck reactive pupils  Lungs: Diminished at the left base  Cardiovascular: Normal S1, Normal S2, RRR  Abdomen: Soft, non tender, non distended, +BS  Extremities: Warm and well perfused  Chest tube flushed at bedside.   Assessment & Plan:  This is a case of a pleasant 73 year old female patient with a past medical history of hypertension, hyperlipidemia, postablative hypothyroidism, depression with anxiety, anemia who is presenting to [facility] on 0/17 with ongoing cough shortness of breath and pleuritic chest pain. Found to have a large complex left sided pleural effusion.    #1. Left Para-mneumonic effusion   PMN predominant exudate with radiographic split pleura sign characteristic of empyema. The fluid is not mucopurulent grossly and gram stain and culture remains negative thus far. Septated/loculated effusion.  Patient receiving tpa/dornase intrapleural therapy.  Today is day 3.  Leukocytosis is trending down.  Will continue antimicrobial therapy but modify off cefepime  due to reported encephalopathy yesterday.     #Hemoptysis - non massive - RESOLVED #Large hiatal hernia - chronic stable, GERD/LPR  precautions   []  CT placement to suction at - 20cmH2O. Will need flushing orders Q6H.  []  Third dose  intrapleural lytics this am.   []  Daily CXR []  Pending cytology. Body fluid Culture (No growth so far)  []  Urine strep and legionella, mrsa swab (-) and sputum culture.  []  c/w Abx and pain management per primary team.   []   Will decide on bronchoscopy based on the above results. No signs of hemoptysis in house , H&H stable and no sign of endobronchial lesion on CT chest.    Thank you for the interesting consult.  Pulmonary Team will follow.    Omer Monter, M.D.  Pulmonary & Critical Care Medicine  [facility]

## 2023-08-27 NOTE — Plan of Care (Signed)

## 2023-08-27 NOTE — Procedures (Signed)
 Pleural Fibrinolytic Administration Procedure Note  Barbara Mclaughlin  [id number]  1950-11-22  Date:08/27/23  Time:7:09 PM   Provider Performing:Grier Vu Jaclynn Mast   Procedure: Pleural Fibrinolysis Subsequent day [phone])  Indication(s) Fibrinolysis of complicated pleural effusion  Consent Risks of the procedure as well as the alternatives and risks of each were explained to the patient and/or caregiver.  Consent for the procedure was obtained.  Anesthesia None  Time Out Verified patient identification, verified procedure, site/side was marked, verified correct patient position, special equipment/implants available, medications/allergies/relevant history reviewed, required imaging and test results available.   Sterile Technique Hand hygiene, gloves   Procedure Description Existing pleural catheter was cleaned and accessed in sterile manner.  10mg  of tPA in 30cc of saline and 5mg  of dornase in 30cc of sterile water  were injected into pleural space using existing pleural catheter.  Catheter will be clamped for 1 hour and then placed back to suction.   Complications/Tolerance None; patient tolerated the procedure well.  EBL None  Specimen(s) None   Barbara Mclaughlin, M.D.  Pulmonary & Critical Care Medicine  [facility]

## 2023-08-27 NOTE — Progress Notes (Signed)
 PROGRESS NOTE    Barbara Mclaughlin  VWU:[id number] DOB: 1950-06-29 DOA: 08/23/2023 PCP: Yehuda Helms, MD  Chief Complaint  Patient presents with   Cough    Hospital Course:  Barbara Mclaughlin is 73 y.o. female with hypertension, hyperlipidemia, postablative hypothyroidism, depression, anxiety, anemia, history of SVT, acoustic neuroma, who presents with cough, shortness of breath, chest pain.  Patient reports persistent dry cough for the last 5 weeks but recently began having hemoptysis..  Patient is scheduled for an outpatient SVT ablation by her cardiologist on 4/29.  He has given her prescription of prophylaxis amoxicillin  which she took for 3 days prior to arrival.  Patient was seen at [facility] clinic on 4/17 and had chest x-ray performed which revealed large left-sided pleural effusion and she was sent to ED for further evaluation.  In the ED she was found have a leukocytosis of 22.4, BNP 117, lactic acid 1.2, Pro-Cal 0.39.  Vital signs were stable.  O2 sat 95% on room air.  CTA was performed which revealed no pulmonary embolism.  CT did reveal cardiomegaly with small pericardial effusion, moderately sized left pleural effusion mostly posterior or subpulmonic with a small portion of fluid in the left apex.  Also revealed a right pleural effusion without loculations and bronchial thickening in the lower lobes with scattered subsegmental bronchial impaction of left lower lobe and consolidation or compressive collapse of the left lower lobe basal segments and dorsal lingula.  A 1 x 0.8 cm right apical subpleural nodule containing ectatic bronchial was also noted as well as 3 other tiny nodules in the posterior segment of the right upper lobe.  She was admitted and initiated on antibiotics.    Subjective: No acute events overnight. Patient is much more alert today though she does report difficulty sleeping overnight.  She endorses some mental fog and feels as though she is processing her  thoughts slowly.  She reports that this is intermittent   Objective: Vitals:   08/26/23 1607 08/27/23 0017 08/27/23 0456 08/27/23 0749  BP: 99/76 (!) 113/56 135/69 107/88  Pulse: 79 73 87 84  Resp: 16 18 20 18   Temp: 98.7 F (37.1 C) 98.4 F (36.9 C) 98 F (36.7 C)   TempSrc: Oral     SpO2: 95% 98% 99% 97%  Weight:      Height:        Intake/Output Summary (Last 24 hours) at 08/27/2023 0800 Last data filed at 08/27/2023 0600 Gross per 24 hour  Intake 1637.47 ml  Output 1000 ml  Net 637.47 ml   Filed Weights   08/23/23 1605  Weight: 68 kg    Examination: General exam: Appears calm and comfortable, NAD  Respiratory system: No work of breathing, no Rales, better aeration on left side today, chest tube in place with sanguinous fluid in canister.  Significantly tender to palpation over chest tube insertion site. Cardiovascular system: S1 & S2 heard, RRR.  Gastrointestinal system: Abdomen is nondistended, soft and nontender.  Neuro: Rousey but arousable.  Oriented x 3  extremities: Symmetric, expected ROM Skin: No rashes, lesions Psychiatry: Mood & affect appropriate for situation.   Assessment & Plan:  Principal Problem:   Pleural effusion Active Problems:   CAP (community acquired pneumonia)   Lung nodule   Hypertension   SVT (supraventricular tachycardia) (HCC)   Hypothyroidism   HLD (hyperlipidemia)   Anemia, iron deficiency   Depression with anxiety    Left, loculated pleural effusion Hemoptysis - Loculate pleural effusion of  moderate size seen on CT.  Etiology unclear.  Malignancy versus empyema.  Patient has has associated hemoptysis.  CT negative for PE.  Consolidation also seen in LLL - Hemodynamically stable on arrival, procal 0.39, afebrile.  WBC 22 - Status post chest tube placement with IR 4/18 - Pulmonology consulted to manage, intrapleural lytics 4/19 and 4/20 - Pleural cultures remain without growth at 48 hours.  Predominantly PMNs continue to  follow - There was concern for empyema so patient is on azithromycin , cefepime , Flagyl .  Can likely further de-escalate given negative cultures - MRSA PCR negative, vancomycin  discontinued on 4/19 - Follow cytology closely - Workup broadened with ANA/RF/CCP.  ANA negative - Strep pneumo negative, Legionella pending. - Respiratory viral panel negative - Blood cultures negative x 4 days.  Leukocytosis Thrombocytosis - Antibiotics as above - Thrombocytosis may be reactive to intrapleural lytics - Trend CBC closely, some improvement today  Somnolence - Concerns for excessive somnolence, patient remains oriented when prompted but difficult to stay awake - Suspect this was secondary to pain medication.  Had discontinued opioids, gabapentin , baclofen .  Alertness has improved significantly. - Continue to respond to pain with Tylenol , Toradol, Voltaren , heating packs, lidocaine   Left-sided chest pain, resolved - Sudden onset chest pain 4/19.  Chest x-ray without acute changes.  Troponin 7, EKG not concerning. - Patient placed back on telemetry.  Continue to monitor closely. - Chest pain likely secondary to chest tube.  Have titrated pain medications.  Declining kidney function - Baseline creatinine 0.78, has been trending upward.  Does not meet criteria for AKI at this time - Continue to monitor CMP closely, resolving now. - Status post IV fluids 4/20 - Continue to avoid nephrotoxic medications - Have discontinued vancomycin  4/19.  Lung nodules - Repeat CT in 3 months  Hyponatremia Hypokalemia - Continue to monitor and replace as needed  Hypertension - Resume home meds - As needed hydralazine  labetalol  SVT -Monitoring on telemetry, currently stable - Continue with home dose Coreg  - Patient is scheduled for SVT ablation with cardiology outpatient 4/29  Hypothyroidism - Continue liothyronine   Hyperlipidemia - Continue statin  Anemia, iron deficiency - Hemoglobin currently  stable near baseline - Continue to trend CBC - Transfuse if needed  Depression with anxiety - Continue home meds   DVT prophylaxis: SCDs   Code Status: Limited: Do not attempt resuscitation (DNR) -DNR-LIMITED -Do Not Intubate/DNI  Disposition: Inpatient, still with chest tube.  Workup ongoing Consultants:  Interventional radiology Pulmonology  Procedures:    Antimicrobials:  Anti-infectives (From admission, onward)    Start     Dose/Rate Route Frequency Ordered Stop   08/25/23 1200  metroNIDAZOLE  (FLAGYL ) IVPB 500 mg        500 mg 100 mL/hr over 60 Minutes Intravenous Every 12 hours 08/25/23 1041     08/24/23 2000  vancomycin  (VANCOREADY) IVPB 1500 mg/300 mL  Status:  Discontinued        1,500 mg 150 mL/hr over 120 Minutes Intravenous Every 24 hours 08/23/23 2040 08/25/23 1650   08/24/23 1700  azithromycin  (ZITHROMAX ) 500 mg in sodium chloride  0.9 % 250 mL IVPB        500 mg 250 mL/hr over 60 Minutes Intravenous Every 24 hours 08/23/23 2001     08/24/23 0600  ceFEPIme  (MAXIPIME ) 2 g in sodium chloride  0.9 % 100 mL IVPB        2 g 200 mL/hr over 30 Minutes Intravenous Every 12 hours 08/23/23 2017     08/23/23  2045  vancomycin  (VANCOREADY) IVPB 500 mg/100 mL        500 mg 100 mL/hr over 60 Minutes Intravenous  Once 08/23/23 2031 08/23/23 2321   08/23/23 1700  vancomycin  (VANCOCIN ) IVPB 1000 mg/200 mL premix        1,000 mg 200 mL/hr over 60 Minutes Intravenous  Once 08/23/23 1646 08/23/23 2130   08/23/23 1700  ceFEPIme  (MAXIPIME ) 2 g in sodium chloride  0.9 % 100 mL IVPB        2 g 200 mL/hr over 30 Minutes Intravenous  Once 08/23/23 1646 08/23/23 1825   08/23/23 1700  azithromycin  (ZITHROMAX ) 500 mg in sodium chloride  0.9 % 250 mL IVPB        500 mg 250 mL/hr over 60 Minutes Intravenous  Once 08/23/23 1646 08/23/23 2031       Data Reviewed: I have personally reviewed following labs and imaging studies CBC: Recent Labs  Lab 08/23/23 1607 08/24/23 0506  08/25/23 0850 08/26/23 0424 08/26/23 1456  WBC 22.4* 24.4* 17.3* 34.0* 33.8*  NEUTROABS 19.4*  --  13.8*  --  29.8*  HGB 11.1* 9.6* 9.5* 11.0* 11.7*  HCT 34.2* 28.2* 28.4* 33.2* 34.0*  MCV 94.0 89.5 89.9 92.0 89.0  PLT 501* 424* 461* 558* 615*   Basic Metabolic Panel: Recent Labs  Lab 08/23/23 1607 08/24/23 0506 08/25/23 0325 08/25/23 0850 08/26/23 0424 08/26/23 1456 08/27/23 0600  NA 132* 132*  --  132* 130* 127*  --   K 3.9 3.4*  --  3.3* 3.5 3.8  --   CL 103 103  --  105 101 102  --   CO2 21* 20*  --  20* 18* 18*  --   GLUCOSE 102* 99  --  90 101* 132*  --   BUN 14 13  --  14 18 21   --   CREATININE 0.78 0.73 0.79 0.78 1.01* 1.14*  --   CALCIUM  8.8* 8.4*  --  8.3* 8.1* 8.2*  --   MG  --   --   --   --  2.2  --  2.2  PHOS  --   --   --   --  4.7*  --  3.5   GFR: Estimated Creatinine Clearance: 41.1 mL/min (A) (by C-G formula based on SCr of 1.14 mg/dL (H)). Liver Function Tests: Recent Labs  Lab 08/23/23 1607 08/25/23 0850 08/26/23 0424 08/26/23 1456  AST 22 19 15 18   ALT 15 14 11 14   ALKPHOS 90 87 79 83  BILITOT 1.0 0.5 0.6 0.6  PROT 7.1 5.7* 5.3* 5.8*  ALBUMIN 2.9* 2.3* 2.0* 2.2*   CBG: No results for input(s): "GLUCAP" in the last 168 hours.  Recent Results (from the past 240 hours)  Blood culture (routine x 2)     Status: None (Preliminary result)   Collection Time: 08/23/23  5:45 PM   Specimen: BLOOD  Result Value Ref Range Status   Specimen Description BLOOD LEFT ANTECUBITAL  Final   Special Requests   Final    BOTTLES DRAWN AEROBIC AND ANAEROBIC Blood Culture adequate volume   Culture   Final    NO GROWTH 4 DAYS Performed at [facility], [address] [place], [place] [address]    Report Status PENDING  Incomplete  Blood culture (routine x 2)     Status: None (Preliminary result)   Collection Time: 08/23/23  5:45 PM   Specimen: BLOOD  Result Value Ref Range Status   Specimen Description  BLOOD BLOOD RIGHT WRIST  Final   Special  Requests   Final    BOTTLES DRAWN AEROBIC AND ANAEROBIC Blood Culture results may not be optimal due to an inadequate volume of blood received in culture bottles   Culture   Final    NO GROWTH 4 DAYS Performed at [facility], [address], [place], [place] [address]    Report Status PENDING  Incomplete  Resp panel by RT-PCR (RSV, Flu A&B, Covid) Anterior Nasal Swab     Status: None   Collection Time: 08/23/23  8:45 PM   Specimen: Anterior Nasal Swab  Result Value Ref Range Status   SARS Coronavirus 2 by RT PCR NEGATIVE NEGATIVE Final    Comment: (NOTE) SARS-CoV-2 target nucleic acids are NOT DETECTED.  The SARS-CoV-2 RNA is generally detectable in upper respiratory specimens during the acute phase of infection. The lowest concentration of SARS-CoV-2 viral copies this assay can detect is 138 copies/mL. A negative result does not preclude SARS-Cov-2 infection and should not be used as the sole basis for treatment or other patient management decisions. A negative result may occur with  improper specimen collection/handling, submission of specimen other than nasopharyngeal swab, presence of viral mutation(s) within the areas targeted by this assay, and inadequate number of viral copies(<138 copies/mL). A negative result must be combined with clinical observations, patient history, and epidemiological information. The expected result is Negative.  Fact Sheet for Patients:  BloggerCourse.com  Fact Sheet for Healthcare Providers:  SeriousBroker.it  This test is no t yet approved or cleared by the [place]  FDA and  has been authorized for detection and/or diagnosis of SARS-CoV-2 by FDA under an Emergency Use Authorization (EUA). This EUA will remain  in effect (meaning this test can be used) for the duration of the COVID-19 declaration under Section 564(b)(1) of the Act, 21 U.S.C.section 360bbb-3(b)(1), unless the  authorization is terminated  or revoked sooner.       Influenza A by PCR NEGATIVE NEGATIVE Final   Influenza B by PCR NEGATIVE NEGATIVE Final    Comment: (NOTE) The Xpert Xpress SARS-CoV-2/FLU/RSV plus assay is intended as an aid in the diagnosis of influenza from Nasopharyngeal swab specimens and should not be used as a sole basis for treatment. Nasal washings and aspirates are unacceptable for Xpert Xpress SARS-CoV-2/FLU/RSV testing.  Fact Sheet for Patients: BloggerCourse.com  Fact Sheet for Healthcare Providers: SeriousBroker.it  This test is not yet approved or cleared by the [place]  FDA and has been authorized for detection and/or diagnosis of SARS-CoV-2 by FDA under an Emergency Use Authorization (EUA). This EUA will remain in effect (meaning this test can be used) for the duration of the COVID-19 declaration under Section 564(b)(1) of the Act, 21 U.S.C. section 360bbb-3(b)(1), unless the authorization is terminated or revoked.     Resp Syncytial Virus by PCR NEGATIVE NEGATIVE Final    Comment: (NOTE) Fact Sheet for Patients: BloggerCourse.com  Fact Sheet for Healthcare Providers: SeriousBroker.it  This test is not yet approved or cleared by the [place]  FDA and has been authorized for detection and/or diagnosis of SARS-CoV-2 by FDA under an Emergency Use Authorization (EUA). This EUA will remain in effect (meaning this test can be used) for the duration of the COVID-19 declaration under Section 564(b)(1) of the Act, 21 U.S.C. section 360bbb-3(b)(1), unless the authorization is terminated or revoked.  Performed at [facility], [address] [place], [place] [address]   Body fluid culture w Gram Stain  Status: None (Preliminary result)   Collection Time: 08/24/23  9:41 AM   Specimen: Pleura; Body Fluid  Result Value Ref Range Status    Specimen Description   Final    PLEURAL Performed at [facility], [address] [place], [place] [address]    Special Requests   Final    NONE Performed at [facility], [address] [place], [place] [address]    Gram Stain   Final    RARE WBC PRESENT, PREDOMINANTLY PMN NO ORGANISMS SEEN    Culture   Final    NO GROWTH 2 DAYS Performed at [facility], [address] [place], [place] [address]    Report Status PENDING  Incomplete  MRSA Next Gen by PCR, Nasal     Status: None   Collection Time: 08/25/23  3:05 PM   Specimen: Nasal Mucosa; Nasal Swab  Result Value Ref Range Status   MRSA by PCR Next Gen NOT DETECTED NOT DETECTED Final    Comment: (NOTE) The GeneXpert MRSA Assay (FDA approved for NASAL specimens only), is one component of a comprehensive MRSA colonization surveillance program. It is not intended to diagnose MRSA infection nor to guide or monitor treatment for MRSA infections. Test performance is not FDA approved in patients less than 26 years old. Performed at [facility], [address], [place], [place] [address]      Radiology Studies: [facility] Chest [place] 1 View Result Date: 08/26/2023 CLINICAL DATA:  73 year old female with partially loculated left pleural effusion, chest tube. EXAM: PORTABLE CHEST 1 VIEW COMPARISON:  Portable chest yesterday and earlier. FINDINGS: Portable AP upright view at 0823 hours. Continued low lung volumes. Stable left pigtail chest tube at the lateral costophrenic angle. Stable cardiac size and mediastinal contours. Moderate hiatal hernia demonstrated recently by CT. Regression of left pleural effusion since 08/23/2023, and also decreased since yesterday. No pneumothorax or pulmonary edema. Otherwise stable lung base hypo ventilation. Stable visualized osseous structures. Stable visible bowel gas. IMPRESSION: Decreasing left pleural effusion with chest tube in place. No pneumothorax. Stable lung base  hypoventilation. Electronically Signed   By: Marlise Simpers M.D.   On: 08/26/2023 12:15   DG Chest Port 1 View Result Date: 08/25/2023 CLINICAL DATA:  [id number] Chest tube in place [id number] EXAM: PORTABLE CHEST - 1 VIEW COMPARISON:  08/25/2023 FINDINGS: Stable pigtail chest tube at the left lung base. No pneumothorax. Persistent left effusion. Patchy opacities at the lung bases left greater than right stable. Heart size and mediastinal contours are within normal limits. Visualized bones unremarkable. IMPRESSION: 1. Stable left chest tube. No pneumothorax. 2. Persistent left effusion and bibasilar opacities. Electronically Signed   By: Nicoletta Barrier M.D.   On: 08/25/2023 18:39    Scheduled Meds:  buPROPion   300 mg Oral Daily   carvedilol   3.125 mg Oral BID WC   Chlorhexidine  Gluconate Cloth  6 each Topical Daily   diclofenac  Sodium  2 g Topical QID   levothyroxine   75 mcg Oral Q0600   liothyronine   5 mcg Oral Daily   multivitamin with minerals  1 tablet Oral Daily   pantoprazole   40 mg Oral Daily   rosuvastatin   10 mg Oral Daily   senna-docusate  1 tablet Oral QHS   sodium chloride  flush  10 mL Intrapleural Q8H   vitamin B-12  300 mcg Oral Daily   Continuous Infusions:  azithromycin  Stopped (08/26/23 1925)   ceFEPime  (MAXIPIME ) IV 2 g (08/27/23 0524)  metronidazole  Stopped (08/27/23 0040)     LOS: 4 days  MDM: Patient is high risk for one or more organ failure.  They necessitate ongoing hospitalization for continued IV therapies and subsequent lab monitoring. Total time spent interpreting labs and vitals, reviewing the medical record, coordinating care amongst consultants and care team members, directly assessing and discussing care with the patient and/or family: 55 min  Aedyn Kempfer, DO [facility]  To contact the attending physician between 7A-7P please use Epic Chat. To contact the covering physician during after hours 7P-7A, please review Amion.  08/27/2023, 8:00 AM   *This  document has been created with the assistance of dictation software. Please excuse typographical errors. *

## 2023-08-27 NOTE — Evaluation (Signed)
 Physical Therapy Evaluation Patient Details Name: Barbara Mclaughlin MRN: [id number] DOB: May 27, 1950 Today's Date: 08/27/2023  History of Present Illness  Barbara Mclaughlin is 73 y.o. female with hypertension, hyperlipidemia, postablative hypothyroidism, depression, anxiety, anemia, history of SVT, acoustic neuroma, who presents with cough, shortness of breath, chest pain.  Clinical Impression  Pt admitted with above diagnosis. Pt currently with functional limitations due to the deficits listed below (see PT Problem List). Pt received upright in bed agreeable to PT. According to RN pt no longer lethargic, A&Ox4. Pt reports PTA being mod-I with mobility in household and community using AD in community and furniture cruising in home. Indep with ADL's.   To date, pt exits bed at supervision with bed features. CGA for STS to RW with VC's for hand placement then CGA to supervision for gait completing ~140' with RW. Does endorse fatigue needing VC's for staying closer to RW. Pt is able to return to recliner then endorses need to urinate. Able to STS from recliner supervision and pass BM and perform pericare, STS from elevated toilet seat, and hand hygiene independently. Pt returns to recliner with all needs in reach. HR is elevated in session ranging from 117-130 BPM with increased WOB but HR appropriately diminishes with rest. Pt with all needs in reach. Will benefit from additional skilled PT services to address acute deficits in weakness and poor endurance to maximize return to PLOF.      If plan is discharge home, recommend the following: A little help with walking and/or transfers;Assist for transportation;Help with stairs or ramp for entrance   Can travel by private vehicle        Equipment Recommendations None recommended by PT  Recommendations for Other Services       Functional Status Assessment Patient has had a recent decline in their functional status and demonstrates the ability  to make significant improvements in function in a reasonable and predictable amount of time.     Precautions / Restrictions Precautions Precautions: Fall Recall of Precautions/Restrictions: Intact Restrictions Weight Bearing Restrictions Per Provider Order: No Other Position/Activity Restrictions: Chest tube      Mobility  Bed Mobility Overal bed mobility: Modified Independent               Patient Response: Cooperative  Transfers Overall transfer level: Needs assistance Equipment used: Rolling walker (2 wheels) Transfers: Sit to/from Stand Sit to Stand: Contact guard assist           General transfer comment: VC's for hand placement    Ambulation/Gait Ambulation/Gait assistance: Supervision Gait Distance (Feet): 140 Feet Assistive device: Rolling walker (2 wheels) Gait Pattern/deviations: Step-to pattern, Decreased step length - right, Decreased step length - left, Narrow base of support       General Gait Details: mildly unsteady needing VC's for staying close to RW. Good tolerance ambulating in hallway then to bathroom in her room.  Stairs            Wheelchair Mobility     Tilt Bed Tilt Bed Patient Response: Cooperative  Modified Rankin (Stroke Patients Only)       Balance Overall balance assessment: Needs assistance Sitting-balance support: Bilateral upper extremity supported, Feet supported Sitting balance-Leahy Scale: Good Sitting balance - Comments: can anteriorly trunk lean to don socks independently   Standing balance support: No upper extremity supported Standing balance-Leahy Scale: Fair Standing balance comment: can stand without UE support on surfaces  Pertinent Vitals/Pain Pain Assessment Pain Assessment: No/denies pain    Home Living Family/patient expects to be discharged to:: Private residence Living Arrangements: Spouse/significant other Available Help at Discharge:  Family;Available 24 hours/day Type of Home: House Home Access: Stairs to enter Entrance Stairs-Rails: Right;Left;Can reach both Entrance Stairs-Number of Steps: 5 (from garage)   Home Layout: One level Home Equipment: Rollator (4 wheels);Cane - single point      Prior Function Prior Level of Function : Independent/Modified Independent             Mobility Comments: furniture cruises hin home. Uses rollator or SPC in community. ADLs Comments: Indep     Extremity/Trunk Assessment   Upper Extremity Assessment Upper Extremity Assessment: Overall WFL for tasks assessed    Lower Extremity Assessment Lower Extremity Assessment: Generalized weakness    Cervical / Trunk Assessment Cervical / Trunk Assessment:  (appears scoliotic)  Communication   Communication Communication: No apparent difficulties    Cognition Arousal: Alert Behavior During Therapy: WFL for tasks assessed/performed   PT - Cognitive impairments: No apparent impairments                         Following commands: Intact       Cueing Cueing Techniques: Verbal cues, Tactile cues     General Comments General comments (skin integrity, edema, etc.): HR: 117-130 BPM with activity.    Exercises     Assessment/Plan    PT Assessment Patient needs continued PT services  PT Problem List Decreased strength;Decreased mobility;Decreased safety awareness;Cardiopulmonary status limiting activity;Decreased activity tolerance;Decreased balance       PT Treatment Interventions DME instruction;Therapeutic exercise;Gait training;Balance training;Stair training;Neuromuscular re-education;Functional mobility training;Therapeutic activities;Patient/family education    PT Goals (Current goals can be found in the Care Plan section)  Acute Rehab PT Goals Patient Stated Goal: improve her strength PT Goal Formulation: With patient Time For Goal Achievement: 09/10/23 Potential to Achieve Goals: Good     Frequency Min 2X/week     Co-evaluation               AM-PAC PT "6 Clicks" Mobility  Outcome Measure Help needed turning from your back to your side while in a flat bed without using bedrails?: A Little Help needed moving from lying on your back to sitting on the side of a flat bed without using bedrails?: A Little Help needed moving to and from a bed to a chair (including a wheelchair)?: A Little Help needed standing up from a chair using your arms (e.g., wheelchair or bedside chair)?: A Little Help needed to walk in hospital room?: A Little Help needed climbing 3-5 steps with a railing? : A Lot 6 Click Score: 17    End of Session Equipment Utilized During Treatment: Gait belt Activity Tolerance: Patient tolerated treatment well Patient left: in chair;with call bell/phone within reach;with chair alarm set;with family/visitor present Nurse Communication: Mobility status PT Visit Diagnosis: Unsteadiness on feet (R26.81);Muscle weakness (generalized) (M62.81)    Time: 8469-6295 PT Time Calculation (min) (ACUTE ONLY): 25 min   Charges:     PT Treatments $Therapeutic Activity: 8-22 mins PT General Charges $$ ACUTE PT VISIT: 1 Visit        Marc Senior. Fairly IV, PT, DPT Physical Therapist- [place]  [facility]  08/27/2023, 10:24 AM

## 2023-08-28 ENCOUNTER — Inpatient Hospital Stay

## 2023-08-28 ENCOUNTER — Encounter: Payer: Self-pay | Admitting: Internal Medicine

## 2023-08-28 DIAGNOSIS — J189 Pneumonia, unspecified organism: Secondary | ICD-10-CM | POA: Diagnosis not present

## 2023-08-28 DIAGNOSIS — J90 Pleural effusion, not elsewhere classified: Secondary | ICD-10-CM | POA: Diagnosis not present

## 2023-08-28 DIAGNOSIS — I10 Essential (primary) hypertension: Secondary | ICD-10-CM | POA: Diagnosis not present

## 2023-08-28 DIAGNOSIS — E890 Postprocedural hypothyroidism: Secondary | ICD-10-CM | POA: Diagnosis not present

## 2023-08-28 LAB — COMPREHENSIVE METABOLIC PANEL WITH GFR
ALT: 13 U/L (ref 0–44)
AST: 18 U/L (ref 15–41)
Albumin: 1.9 g/dL — ABNORMAL LOW (ref 3.5–5.0)
Alkaline Phosphatase: 66 U/L (ref 38–126)
Anion gap: 7 (ref 5–15)
BUN: 19 mg/dL (ref 8–23)
CO2: 18 mmol/L — ABNORMAL LOW (ref 22–32)
Calcium: 7.9 mg/dL — ABNORMAL LOW (ref 8.9–10.3)
Chloride: 106 mmol/L (ref 98–111)
Creatinine, Ser: 0.73 mg/dL (ref 0.44–1.00)
GFR, Estimated: >60 mL/min (ref >60)
Glucose, Bld: 90 mg/dL (ref 70–99)
Potassium: 3.8 mmol/L (ref 3.5–5.1)
Sodium: 131 mmol/L — ABNORMAL LOW (ref 135–145)
Total Bilirubin: 0.7 mg/dL (ref 0.0–1.2)
Total Protein: 5.1 g/dL — ABNORMAL LOW (ref 6.5–8.1)

## 2023-08-28 LAB — CBC WITH DIFFERENTIAL/PLATELET
Abs Immature Granulocytes: 0.99 10*3/uL — ABNORMAL HIGH (ref 0.00–0.07)
Basophils Absolute: 0.1 10*3/uL (ref 0.0–0.1)
Basophils Relative: 1 %
Eosinophils Absolute: 0.2 10*3/uL (ref 0.0–0.5)
Eosinophils Relative: 1 %
HCT: 28.5 % — ABNORMAL LOW (ref 36.0–46.0)
Hemoglobin: 9.7 g/dL — ABNORMAL LOW (ref 12.0–15.0)
Immature Granulocytes: 4 %
Lymphocytes Relative: 5 %
Lymphs Abs: 1.1 10*3/uL (ref 0.7–4.0)
MCH: 30.7 pg (ref 26.0–34.0)
MCHC: 34 g/dL (ref 30.0–36.0)
MCV: 90.2 fL (ref 80.0–100.0)
Monocytes Absolute: 1.9 10*3/uL — ABNORMAL HIGH (ref 0.1–1.0)
Monocytes Relative: 8 %
Neutro Abs: 19.5 10*3/uL — ABNORMAL HIGH (ref 1.7–7.7)
Neutrophils Relative %: 81 %
Platelets: 591 10*3/uL — ABNORMAL HIGH (ref 150–400)
RBC: 3.16 MIL/uL — ABNORMAL LOW (ref 3.87–5.11)
RDW: 13.2 % (ref 11.5–15.5)
WBC: 23.8 10*3/uL — ABNORMAL HIGH (ref 4.0–10.5)
nRBC: 0 % (ref 0.0–0.2)

## 2023-08-28 LAB — BODY FLUID CULTURE W GRAM STAIN

## 2023-08-28 LAB — CULTURE, BLOOD (ROUTINE X 2)
Culture: NO GROWTH
Culture: NO GROWTH
Special Requests: ADEQUATE

## 2023-08-28 LAB — CYTOLOGY - NON PAP

## 2023-08-28 LAB — PHOSPHORUS: Phosphorus: 2.5 mg/dL (ref 2.5–4.6)

## 2023-08-28 LAB — LEGIONELLA PNEUMOPHILA SEROGP 1 UR AG: L. pneumophila Serogp 1 Ur Ag: NEGATIVE

## 2023-08-28 LAB — MAGNESIUM: Magnesium: 2.1 mg/dL (ref 1.7–2.4)

## 2023-08-28 LAB — RHEUMATOID FACTOR: Rheumatoid fact SerPl-aCnc: 22.9 [IU]/mL — ABNORMAL HIGH (ref <14.0)

## 2023-08-28 LAB — CYCLIC CITRUL PEPTIDE ANTIBODY, IGG/IGA: CCP Antibodies IgG/IgA: 13 U (ref 0–19)

## 2023-08-28 MED ORDER — ASPIRIN 81 MG PO TBEC
81.0000 mg | DELAYED_RELEASE_TABLET | Freq: Every day | ORAL | Status: DC
  Administered 2023-08-29 – 2023-08-30 (×2): 81 mg via ORAL
  Filled 2023-08-28 (×2): qty 1

## 2023-08-28 MED ORDER — SODIUM CHLORIDE 0.9 % IV SOLN
3.0000 g | Freq: Four times a day (QID) | INTRAVENOUS | Status: DC
  Filled 2023-08-28: qty 8

## 2023-08-28 MED ORDER — QUETIAPINE FUMARATE 25 MG PO TABS
25.0000 mg | ORAL_TABLET | Freq: Every day | ORAL | Status: DC
  Administered 2023-08-28 – 2023-08-29 (×3): 25 mg via ORAL
  Filled 2023-08-28 (×3): qty 1

## 2023-08-28 MED ORDER — SODIUM CHLORIDE 0.9 % IV SOLN
3.0000 g | Freq: Four times a day (QID) | INTRAVENOUS | Status: DC
  Administered 2023-08-28 – 2023-08-30 (×8): 3 g via INTRAVENOUS
  Filled 2023-08-28 (×10): qty 8

## 2023-08-28 NOTE — Progress Notes (Signed)
 Physical Therapy Treatment Patient Details Name: ISAIAH TOROK MRN: [id number] DOB: Oct 09, 1950 Today's Date: 08/28/2023   History of Present Illness ORRA NOLDE is 73 y.o. female with hypertension, hyperlipidemia, postablative hypothyroidism, depression, anxiety, anemia, history of SVT, acoustic neuroma, who presents with cough, shortness of breath, chest pain.    PT Comments  Pt received upright in bed with family present. Agreeable to PT services. Daughter agreeable to assist with IV pole management to transport pt to stairs for stair assessment. Pt able to don underwear in supine independently via bridging in bed. Mod-I for bed mobility and CGA for STS and gait in room to recliner at [facility] due to Lines/lead management. Pt and daughter educated on RW use to enter/exit steps for mimicking enter/exit home. They are understanding. Pt completes steps safely using rails and step to pattern. Pt returned to room and transfers back to bed with all needs in reach. Equipment reconnected. Pt with all needs in reach with D/c recs remain appropriate.      If plan is discharge home, recommend the following: A little help with walking and/or transfers;Assist for transportation;Help with stairs or ramp for entrance   Can travel by private vehicle        Equipment Recommendations  None recommended by PT    Recommendations for Other Services       Precautions / Restrictions Precautions Precautions: Fall Recall of Precautions/Restrictions: Intact Restrictions Weight Bearing Restrictions Per Provider Order: No Other Position/Activity Restrictions: Chest tube     Mobility  Bed Mobility Overal bed mobility: Modified Independent               Patient Response: Cooperative  Transfers Overall transfer level: Needs assistance Equipment used: Rolling walker (2 wheels) Transfers: Sit to/from Stand, Bed to chair/wheelchair/BSC Sit to Stand: Contact guard assist   Step pivot  transfers: Contact guard assist       General transfer comment: CGA due to equipment and line/lead managment    Ambulation/Gait                   Stairs Stairs: Yes Stairs assistance: Supervision Stair Management: Two rails, Step to pattern, Forwards Number of Stairs: 4 General stair comments: asc/desc safely. Daughter present agreeing pt safe with attempt. Discussed assist with RW management via family member for entering/exiting home   Wheelchair Mobility     Tilt Bed Tilt Bed Patient Response: Cooperative  Modified Rankin (Stroke Patients Only)       Balance Overall balance assessment: Needs assistance Sitting-balance support: Bilateral upper extremity supported, Feet supported Sitting balance-Leahy Scale: Good       Standing balance-Leahy Scale: Fair Standing balance comment: can stand without UE support on surfaces                            Communication Communication Communication: No apparent difficulties  Cognition Arousal: Alert Behavior During Therapy: WFL for tasks assessed/performed   PT - Cognitive impairments: No apparent impairments                         Following commands: Intact      Cueing Cueing Techniques: Verbal cues, Tactile cues  Exercises      General Comments        Pertinent Vitals/Pain Pain Assessment Pain Assessment: No/denies pain    Home Living  Prior Function            PT Goals (current goals can now be found in the care plan section) Acute Rehab PT Goals Patient Stated Goal: improve her strength PT Goal Formulation: With patient Time For Goal Achievement: 09/10/23 Potential to Achieve Goals: Good Progress towards PT goals: Progressing toward goals    Frequency    Min 2X/week      PT Plan      Co-evaluation              AM-PAC PT "6 Clicks" Mobility   Outcome Measure  Help needed turning from your back to your side while  in a flat bed without using bedrails?: A Little Help needed moving from lying on your back to sitting on the side of a flat bed without using bedrails?: A Little Help needed moving to and from a bed to a chair (including a wheelchair)?: A Little Help needed standing up from a chair using your arms (e.g., wheelchair or bedside chair)?: A Little Help needed to walk in hospital room?: A Little Help needed climbing 3-5 steps with a railing? : A Little 6 Click Score: 18    End of Session Equipment Utilized During Treatment: Gait belt Activity Tolerance: Patient tolerated treatment well Patient left: in bed;with call bell/phone within reach;with bed alarm set;with family/visitor present Nurse Communication: Mobility status PT Visit Diagnosis: Unsteadiness on feet (R26.81);Muscle weakness (generalized) (M62.81)     Time: 0454-0981 PT Time Calculation (min) (ACUTE ONLY): 30 min  Charges:    $Gait Training: 23-37 mins PT General Charges $$ ACUTE PT VISIT: 1 Visit                     Marc Senior. Fairly IV, PT, DPT Physical Therapist- [place]  [facility]  08/28/2023, 11:35 AM

## 2023-08-28 NOTE — Progress Notes (Signed)
 NAME:  Barbara Mclaughlin, MRN:  [id number], DOB:  [birth year], LOS: 5 ADMISSION DATE:  08/23/2023 History of Present Illness:  This is a case of a pleasant 72 year old female patient with a past medical history of hypertension, hyperlipidemia, postablative hypothyroidism, depression with anxiety, anemia who is presenting to [facility] on 0/17 with ongoing cough shortness of breath and pleuritic chest pain.   She reports to me that about 5 weeks ago she started complaining of shortness of breath associated with cough and few episodes of hemoptysis.  She did not have hemoptysis while in house.  She also developed pleuritic chest pain.   On arrival she was hemodynamically stable elevated WBC count was noted at 22,000. CTA chest revealed a moderate to large left loculated pleural effusion. No PE.    Family history - Parents with COPD who were both smokers    Social history - Never Smoker   Pertinent  Medical History  As above   Significant Hospital Events: Including procedures, antibiotic start and stop dates in addition to other pertinent events   08/25/2023 s/p Left sided chest tube placement with fluid analysis consistent with a complicated parapn vs empyema.  08/27/23- patient for 3rd tpa/dornase treatment today. Leukocytosis is improving. She is on room air with normoxia.  Chest tube >440 cc drainage.  I met with family at bedside and reviewed care plan.  08/28/23- patient with no overnight events. Remains with stable vital signs on room air. Her RF is increased while remainder of AI workup is negative thus far (a/ccp still in process), this can occur in context of sub acute infection and advanced age or alternatively patient may have underlying autoimmune process/CTD. There is repeat CXR done this am with miniam effusion and bibasilar atelectasis. Plan to initate metaneb recruitment today and clamp chest tube to off position for preparation to remove chest tube.     Objective   Blood pressure  124/77, pulse 81, temperature 98 F (36.7 C), temperature source Oral, resp. rate 19, height 5\' 6"  (1.676 m), weight 68 kg, SpO2 97%.        Intake/Output Summary (Last 24 hours) at 08/28/2023 0816 Last data filed at 08/28/2023 0622 Gross per 24 hour  Intake 1260 ml  Output 740 ml  Net 520 ml   Filed Weights   08/23/23 1605  Weight: 68 kg    Examination: General: NAD, a bit more lethargic today.  HENT: Supple neck reactive pupils  Lungs: Diminished at the left base  Cardiovascular: Normal S1, Normal S2, RRR  Abdomen: Soft, non tender, non distended, +BS  Extremities: Warm and well perfused  Chest tube flushed at bedside.   Assessment & Plan:  This is a case of a pleasant 73 year old female patient with a past medical history of hypertension, hyperlipidemia, postablative hypothyroidism, depression with anxiety, anemia who is presenting to [facility] on 0/17 with ongoing cough shortness of breath and pleuritic chest pain. Found to have a large complex left sided pleural effusion.    #1. Left Para-pneumonic effusion   PMN predominant exudate with radiographic split pleura sign characteristic of empyema. The fluid is not mucopurulent grossly and gram stain and culture remains negative thus far. Septated/loculated effusion.  Patient receiving tpa/dornase intrapleural therapy.  Today is day 3.  Leukocytosis is trending down.  Will continue antimicrobial therapy but modify off cefepime  due to reported encephalopathy yesterday.     #Hemoptysis - non massive - RESOLVED #Large hiatal hernia - chronic stable, GERD/LPR  precautions   []   CT placement to suction at - 20cmH2O. Will need flushing orders Q6H.  []  Third dose  intrapleural lytics this am.   []  Daily CXR []  Pending cytology. Body fluid Culture (No growth so far)  []  Urine strep and legionella, mrsa swab (-) and sputum culture.  []  c/w Abx and pain management per primary team.   []  Will decide on bronchoscopy based on the above  results. No signs of hemoptysis in house , H&H stable and no sign of endobronchial lesion on CT chest.    Thank you for the interesting consult.  Pulmonary Team will follow.    Breelynn Bankert, M.D.  Pulmonary & Critical Care Medicine  [facility]

## 2023-08-28 NOTE — Progress Notes (Addendum)
 PROGRESS NOTE    Barbara Mclaughlin  YQM:[id number] DOB: 12/25/1950 DOA: 08/23/2023 PCP: Yehuda Helms, MD  Chief Complaint  Patient presents with   Cough    Hospital Course:  Barbara Mclaughlin is 73 y.o. female with hypertension, hyperlipidemia, postablative hypothyroidism, depression, anxiety, anemia, history of SVT, acoustic neuroma, who presents with cough, shortness of breath, chest pain.  Patient reports persistent dry cough for the last 5 weeks but recently began having hemoptysis..  Patient is scheduled for an outpatient SVT ablation by her cardiologist on 4/29.  He has given her prescription of prophylaxis amoxicillin  which she took for 3 days prior to arrival.  Patient was seen at [facility] clinic on 4/17 and had chest x-ray performed which revealed large left-sided pleural effusion and she was sent to ED for further evaluation.  In the ED she was found have a leukocytosis of 22.4, BNP 117, lactic acid 1.2, Pro-Cal 0.39.  Vital signs were stable.  O2 sat 95% on room air.  CTA was performed which revealed no pulmonary embolism.  CT did reveal cardiomegaly with small pericardial effusion, moderately sized left pleural effusion mostly posterior or subpulmonic with a small portion of fluid in the left apex.  Also revealed a right pleural effusion without loculations and bronchial thickening in the lower lobes with scattered subsegmental bronchial impaction of left lower lobe and consolidation or compressive collapse of the left lower lobe basal segments and dorsal lingula.  A 1 x 0.8 cm right apical subpleural nodule containing ectatic bronchial was also noted as well as 3 other tiny nodules in the posterior segment of the right upper lobe.  She was admitted and initiated on antibiotics.    Subjective: No acute events overnight.  On evaluation today patient has some questions about the pathophysiology of her illness, she is still concerned about her mental fog.  Her daughters are at  bedside.  We had extensive discussion was answered to the best my ability   Objective: Vitals:   08/27/23 1644 08/27/23 1957 08/27/23 2345 08/28/23 0454  BP: 122/71 (!) 110/58 129/80 124/77  Pulse: 88 87 88 81  Resp: 16 16 18 19   Temp: 98.2 F (36.8 C) 98.9 F (37.2 C)  98 F (36.7 C)  TempSrc: Oral Oral  Oral  SpO2: 98% 96% 97% 97%  Weight:      Height:        Intake/Output Summary (Last 24 hours) at 08/28/2023 0748 Last data filed at 08/28/2023 5284 Gross per 24 hour  Intake 1260 ml  Output 740 ml  Net 520 ml   Filed Weights   08/23/23 1605  Weight: 68 kg    Examination: General exam: Appears calm and comfortable, NAD  Respiratory system: No work of breathing, no Rales, better aeration on left side today, chest tube in place with sanguinous fluid in canister.  Significantly tender to palpation over chest tube insertion site. Cardiovascular system: S1 & S2 heard, RRR.  Gastrointestinal system: Abdomen is nondistended, soft and nontender.  Neuro: Rousey but arousable.  Oriented x 3  extremities: Symmetric, expected ROM Skin: No rashes, lesions Psychiatry: Mood & affect appropriate for situation.   Assessment & Plan:  Principal Problem:   Pleural effusion Active Problems:   CAP (community acquired pneumonia)   Lung nodule   Hypertension   SVT (supraventricular tachycardia) (HCC)   Hypothyroidism   HLD (hyperlipidemia)   Anemia, iron deficiency   Depression with anxiety    Left, loculated pleural effusion Hemoptysis -  Loculate pleural effusion of moderate size seen on CT.  Etiology unclear.  Malignancy versus empyema.  Patient has has associated hemoptysis.  CT negative for PE.  Consolidation also seen in LLL On arrival hemodynamically stable, procal 0.39, afebrile, with WBC 22 - Status post chest tube placement 4/18 - Intrapleural lytics 4/19, 4/20, 4/21 - Pulmonology continues to manage chest tube, potentially clamping today removing tomorrow - Pleural  cultures remain without growth.  Predominantly PMNs.  Continue to follow - Clinically appears as empyema the patient has been on azithromycin , cefepime , Flagyl .  Will further de-escalate to Unasyn  today given negative cultures - MRSA PCR negative, vancomycin  discontinued 4/19 - Continue to follow cytology closely - Patient has had no observed hemoptysis in house - ANA negative, RF positive, CCP still pending. - Strep pneumo negative, Legionella pending - Respiratory viral panel negative - Blood cultures negative x 4 days  Leukocytosis Thrombocytosis - Antibiotics as above - Thrombocytosis may be reactive to intrapleural lytics - Trend CBC closely, some improvement. - Smear review: Unremarkable WBC morphology, normal platelet morphology, some burr cells  Altered mentation - Suspect this is toxic metabolic encephalopathy secondary to pain medications and possibly cefepime  - Was excessively somnolent on 4/20, improved significantly after pain medication discontinuation - Patient still endorsing mental fogginess and slowed thinking.  She is also endorsing some vision disturbances and hallucinations.  Perform CT scan today. - No localizing deficits on exam - Discontinue cefepime  which may be contributing. - Continue to respond to pain with Tylenol , Toradol, Voltaren , heating packs, lidocaine  - Patient does request to resume her home dose Seroquel  at night.  Left-sided chest pain, resolved - Sudden onset chest pain 4/19.  Chest x-ray without acute changes.  Troponin 7, EKG not concerning. - Patient placed back on telemetry.  Continue to monitor closely. - Chest pain likely secondary to chest tube.  Have titrated pain medications.  Declining kidney function - Baseline creatinine 0.78, has been trending upward.  Does not meet criteria for AKI at this time - Continue to monitor CMP closely, resolving now. - Status post IV fluids 4/20 - Continue to avoid nephrotoxic medications - Have  discontinued vancomycin  4/19.  Lung nodules - Repeat CT in 3 months  Hyponatremia Hypokalemia - Continue to monitor and replace as needed  Hypertension - Resume home meds - As needed hydralazine  labetalol  SVT -Monitoring on telemetry, currently stable - Continue with home dose Coreg  - Patient is scheduled for SVT ablation with cardiology outpatient 4/29  Hypothyroidism - Continue liothyronine   Hyperlipidemia - Continue statin  Anemia, iron deficiency - Hemoglobin currently stable near baseline - Continue to trend CBC - Transfuse if needed  Depression with anxiety - Continue home meds  **Addendum: Head CT reveals stable likely-meningioma from 2023, age indeterminate lacunar infarct.  No right-sided deficits appreciated on exam today.  Discussed findings extensively with the patient as well as with her daughter at bedside.  They endorsed understanding.  Patient is already on a statin.  Will add aspirin .  Patient has known SVT and recent echocardiograms.  She follows outpatient with cardiology and has upcoming plans for ablation next month.  She is not currently on anticoagulation. Additionally, cytology from pleural fluid has resulted without abnormality.  Chest x-ray with significant improvement.  Pulmonology planning for likely chest tube removal tomorrow.   Home Health orders have been placed.  DVT prophylaxis: SCDs   Code Status: Limited: Do not attempt resuscitation (DNR) -DNR-LIMITED -Do Not Intubate/DNI  Disposition: Inpatient, still with chest  tube.  Workup ongoing Consultants:  Interventional radiology Pulmonology  Procedures:    Antimicrobials:  Anti-infectives (From admission, onward)    Start     Dose/Rate Route Frequency Ordered Stop   08/25/23 1200  metroNIDAZOLE  (FLAGYL ) IVPB 500 mg        500 mg 100 mL/hr over 60 Minutes Intravenous Every 12 hours 08/25/23 1041     08/24/23 2000  vancomycin  (VANCOREADY) IVPB 1500 mg/300 mL  Status:  Discontinued         1,500 mg 150 mL/hr over 120 Minutes Intravenous Every 24 hours 08/23/23 2040 08/25/23 1650   08/24/23 1700  azithromycin  (ZITHROMAX ) 500 mg in sodium chloride  0.9 % 250 mL IVPB        500 mg 250 mL/hr over 60 Minutes Intravenous Every 24 hours 08/23/23 2001     08/24/23 0600  ceFEPIme  (MAXIPIME ) 2 g in sodium chloride  0.9 % 100 mL IVPB        2 g 200 mL/hr over 30 Minutes Intravenous Every 12 hours 08/23/23 2017     08/23/23 2045  vancomycin  (VANCOREADY) IVPB 500 mg/100 mL        500 mg 100 mL/hr over 60 Minutes Intravenous  Once 08/23/23 2031 08/23/23 2321   08/23/23 1700  vancomycin  (VANCOCIN ) IVPB 1000 mg/200 mL premix        1,000 mg 200 mL/hr over 60 Minutes Intravenous  Once 08/23/23 1646 08/23/23 2130   08/23/23 1700  ceFEPIme  (MAXIPIME ) 2 g in sodium chloride  0.9 % 100 mL IVPB        2 g 200 mL/hr over 30 Minutes Intravenous  Once 08/23/23 1646 08/23/23 1825   08/23/23 1700  azithromycin  (ZITHROMAX ) 500 mg in sodium chloride  0.9 % 250 mL IVPB        500 mg 250 mL/hr over 60 Minutes Intravenous  Once 08/23/23 1646 08/23/23 2031       Data Reviewed: I have personally reviewed following labs and imaging studies CBC: Recent Labs  Lab 08/23/23 1607 08/24/23 0506 08/25/23 0850 08/26/23 0424 08/26/23 1456 08/27/23 0842  WBC 22.4* 24.4* 17.3* 34.0* 33.8* 24.0*  NEUTROABS 19.4*  --  13.8*  --  29.8* 20.3*  HGB 11.1* 9.6* 9.5* 11.0* 11.7* 10.4*  HCT 34.2* 28.2* 28.4* 33.2* 34.0* 30.4*  MCV 94.0 89.5 89.9 92.0 89.0 90.2  PLT 501* 424* 461* 558* 615* 556*   Basic Metabolic Panel: Recent Labs  Lab 08/24/23 0506 08/25/23 0325 08/25/23 0850 08/26/23 0424 08/26/23 1456 08/27/23 0600 08/27/23 0842 08/28/23 0556  NA 132*  --  132* 130* 127*  --  132*  --   K 3.4*  --  3.3* 3.5 3.8  --  3.3*  --   CL 103  --  105 101 102  --  107  --   CO2 20*  --  20* 18* 18*  --  17*  --   GLUCOSE 99  --  90 101* 132*  --  93  --   BUN 13  --  14 18 21   --  20  --   CREATININE  0.73 0.79 0.78 1.01* 1.14*  --  0.98  --   CALCIUM  8.4*  --  8.3* 8.1* 8.2*  --  7.8*  --   MG  --   --   --  2.2  --  2.2  --  2.1  PHOS  --   --   --  4.7*  --  3.5  --  2.5  GFR: Estimated Creatinine Clearance: 47.9 mL/min (by C-G formula based on SCr of 0.98 mg/dL). Liver Function Tests: Recent Labs  Lab 08/23/23 1607 08/25/23 0850 08/26/23 0424 08/26/23 1456 08/27/23 0842  AST 22 19 15 18 21   ALT 15 14 11 14 14   ALKPHOS 90 87 79 83 76  BILITOT 1.0 0.5 0.6 0.6 0.6  PROT 7.1 5.7* 5.3* 5.8* 5.2*  ALBUMIN 2.9* 2.3* 2.0* 2.2* 1.9*   CBG: No results for input(s): "GLUCAP" in the last 168 hours.  Recent Results (from the past 240 hours)  Blood culture (routine x 2)     Status: None   Collection Time: 08/23/23  5:45 PM   Specimen: BLOOD  Result Value Ref Range Status   Specimen Description BLOOD LEFT ANTECUBITAL  Final   Special Requests   Final    BOTTLES DRAWN AEROBIC AND ANAEROBIC Blood Culture adequate volume   Culture   Final    NO GROWTH 5 DAYS Performed at [facility], [address] [place], [place] [address]    Report Status 08/28/2023 FINAL  Final  Blood culture (routine x 2)     Status: None   Collection Time: 08/23/23  5:45 PM   Specimen: BLOOD  Result Value Ref Range Status   Specimen Description BLOOD BLOOD RIGHT WRIST  Final   Special Requests   Final    BOTTLES DRAWN AEROBIC AND ANAEROBIC Blood Culture results may not be optimal due to an inadequate volume of blood received in culture bottles   Culture   Final    NO GROWTH 5 DAYS Performed at [facility], [address] [place], [place] [address]    Report Status 08/28/2023 FINAL  Final  Resp panel by RT-PCR (RSV, Flu A&B, Covid) Anterior Nasal Swab     Status: None   Collection Time: 08/23/23  8:45 PM   Specimen: Anterior Nasal Swab  Result Value Ref Range Status   SARS Coronavirus 2 by RT PCR NEGATIVE NEGATIVE Final    Comment: (NOTE) SARS-CoV-2 target nucleic acids are  NOT DETECTED.  The SARS-CoV-2 RNA is generally detectable in upper respiratory specimens during the acute phase of infection. The lowest concentration of SARS-CoV-2 viral copies this assay can detect is 138 copies/mL. A negative result does not preclude SARS-Cov-2 infection and should not be used as the sole basis for treatment or other patient management decisions. A negative result may occur with  improper specimen collection/handling, submission of specimen other than nasopharyngeal swab, presence of viral mutation(s) within the areas targeted by this assay, and inadequate number of viral copies(<138 copies/mL). A negative result must be combined with clinical observations, patient history, and epidemiological information. The expected result is Negative.  Fact Sheet for Patients:  BloggerCourse.com  Fact Sheet for Healthcare Providers:  SeriousBroker.it  This test is no t yet approved or cleared by the [place]  FDA and  has been authorized for detection and/or diagnosis of SARS-CoV-2 by FDA under an Emergency Use Authorization (EUA). This EUA will remain  in effect (meaning this test can be used) for the duration of the COVID-19 declaration under Section 564(b)(1) of the Act, 21 U.S.C.section 360bbb-3(b)(1), unless the authorization is terminated  or revoked sooner.       Influenza A by PCR NEGATIVE NEGATIVE Final   Influenza B by PCR NEGATIVE NEGATIVE Final    Comment: (NOTE) The Xpert Xpress SARS-CoV-2/FLU/RSV plus assay is intended as an aid in the diagnosis of influenza from Nasopharyngeal swab specimens and  should not be used as a sole basis for treatment. Nasal washings and aspirates are unacceptable for Xpert Xpress SARS-CoV-2/FLU/RSV testing.  Fact Sheet for Patients: BloggerCourse.com  Fact Sheet for Healthcare Providers: SeriousBroker.it  This test is not  yet approved or cleared by the [place]  FDA and has been authorized for detection and/or diagnosis of SARS-CoV-2 by FDA under an Emergency Use Authorization (EUA). This EUA will remain in effect (meaning this test can be used) for the duration of the COVID-19 declaration under Section 564(b)(1) of the Act, 21 U.S.C. section 360bbb-3(b)(1), unless the authorization is terminated or revoked.     Resp Syncytial Virus by PCR NEGATIVE NEGATIVE Final    Comment: (NOTE) Fact Sheet for Patients: BloggerCourse.com  Fact Sheet for Healthcare Providers: SeriousBroker.it  This test is not yet approved or cleared by the [place]  FDA and has been authorized for detection and/or diagnosis of SARS-CoV-2 by FDA under an Emergency Use Authorization (EUA). This EUA will remain in effect (meaning this test can be used) for the duration of the COVID-19 declaration under Section 564(b)(1) of the Act, 21 U.S.C. section 360bbb-3(b)(1), unless the authorization is terminated or revoked.  Performed at [facility], [address] [place], [place] [address]   Body fluid culture w Gram Stain     Status: None (Preliminary result)   Collection Time: 08/24/23  9:41 AM   Specimen: Pleura; Body Fluid  Result Value Ref Range Status   Specimen Description   Final    PLEURAL Performed at [facility], [address] [place], [place] [address]    Special Requests   Final    NONE Performed at [facility], [address] [place], [place] [address]    Gram Stain   Final    RARE WBC PRESENT, PREDOMINANTLY PMN NO ORGANISMS SEEN    Culture   Final    NO GROWTH 3 DAYS Performed at [facility], [address] [place], [place] [address]    Report Status PENDING  Incomplete  MRSA Next Gen by PCR, Nasal     Status: None   Collection Time: 08/25/23  3:05 PM   Specimen: Nasal Mucosa; Nasal Swab  Result Value Ref  Range Status   MRSA by PCR Next Gen NOT DETECTED NOT DETECTED Final    Comment: (NOTE) The GeneXpert MRSA Assay (FDA approved for NASAL specimens only), is one component of a comprehensive MRSA colonization surveillance program. It is not intended to diagnose MRSA infection nor to guide or monitor treatment for MRSA infections. Test performance is not FDA approved in patients less than 15 years old. Performed at [facility], [address] [place], [place] [address]      Radiology Studies: [facility] Chest [place] 1 View Result Date: 08/26/2023 CLINICAL DATA:  73 year old female with partially loculated left pleural effusion, chest tube. EXAM: PORTABLE CHEST 1 VIEW COMPARISON:  Portable chest yesterday and earlier. FINDINGS: Portable AP upright view at 0823 hours. Continued low lung volumes. Stable left pigtail chest tube at the lateral costophrenic angle. Stable cardiac size and mediastinal contours. Moderate hiatal hernia demonstrated recently by CT. Regression of left pleural effusion since 08/23/2023, and also decreased since yesterday. No pneumothorax or pulmonary edema. Otherwise stable lung base hypo ventilation. Stable visualized osseous structures. Stable visible bowel gas. IMPRESSION: Decreasing left pleural effusion with chest tube in place. No pneumothorax. Stable lung base hypoventilation. Electronically Signed   By: Marlise Simpers M.D.   On: 08/26/2023 12:15  Scheduled Meds:  buPROPion   300 mg Oral Daily   carvedilol   3.125 mg Oral BID WC   Chlorhexidine  Gluconate Cloth  6 each Topical Daily   diclofenac  Sodium  2 g Topical QID   levothyroxine   75 mcg Oral Q0600   liothyronine   5 mcg Oral Daily   multivitamin with minerals  1 tablet Oral Daily   pantoprazole   40 mg Oral Daily   rosuvastatin   10 mg Oral Daily   senna-docusate  1 tablet Oral QHS   sodium chloride  flush  10 mL Intrapleural Q8H   vitamin B-12  300 mcg Oral Daily   Continuous Infusions:  azithromycin  Stopped  (08/27/23 2130)   ceFEPime  (MAXIPIME ) IV Stopped (08/28/23 0622)   metronidazole  Stopped (08/28/23 0115)     LOS: 5 days  MDM: Patient is high risk for one or more organ failure.  They necessitate ongoing hospitalization for continued IV therapies and subsequent lab monitoring. Total time spent interpreting labs and vitals, reviewing the medical record, coordinating care amongst consultants and care team members, directly assessing and discussing care with the patient and/or family: 55 min  Jheremy Boger, DO [facility]  To contact the attending physician between 7A-7P please use Epic Chat. To contact the covering physician during after hours 7P-7A, please review Amion.  08/28/2023, 7:48 AM   *This document has been created with the assistance of dictation software. Please excuse typographical errors. *

## 2023-08-28 NOTE — Plan of Care (Signed)

## 2023-08-28 NOTE — Care Management Important Message (Signed)
 Important Message  Patient Details  Name: Barbara Mclaughlin MRN: [id number] Date of Birth: 08-29-1950   Important Message Given:  Yes - Medicare IM     Jalasia Eskridge W, CMA 08/28/2023, 10:14 AM

## 2023-08-29 ENCOUNTER — Inpatient Hospital Stay

## 2023-08-29 DIAGNOSIS — J90 Pleural effusion, not elsewhere classified: Secondary | ICD-10-CM | POA: Diagnosis not present

## 2023-08-29 LAB — CBC WITH DIFFERENTIAL/PLATELET
Abs Immature Granulocytes: 1.19 10*3/uL — ABNORMAL HIGH (ref 0.00–0.07)
Basophils Absolute: 0.2 10*3/uL — ABNORMAL HIGH (ref 0.0–0.1)
Basophils Relative: 1 %
Eosinophils Absolute: 0.2 10*3/uL (ref 0.0–0.5)
Eosinophils Relative: 1 %
HCT: 27.6 % — ABNORMAL LOW (ref 36.0–46.0)
Hemoglobin: 9.4 g/dL — ABNORMAL LOW (ref 12.0–15.0)
Immature Granulocytes: 6 %
Lymphocytes Relative: 7 %
Lymphs Abs: 1.3 10*3/uL (ref 0.7–4.0)
MCH: 30.6 pg (ref 26.0–34.0)
MCHC: 34.1 g/dL (ref 30.0–36.0)
MCV: 89.9 fL (ref 80.0–100.0)
Monocytes Absolute: 1.8 10*3/uL — ABNORMAL HIGH (ref 0.1–1.0)
Monocytes Relative: 9 %
Neutro Abs: 14.6 10*3/uL — ABNORMAL HIGH (ref 1.7–7.7)
Neutrophils Relative %: 76 %
Platelets: 570 10*3/uL — ABNORMAL HIGH (ref 150–400)
RBC: 3.07 MIL/uL — ABNORMAL LOW (ref 3.87–5.11)
RDW: 13.3 % (ref 11.5–15.5)
WBC: 19.3 10*3/uL — ABNORMAL HIGH (ref 4.0–10.5)
nRBC: 0 % (ref 0.0–0.2)

## 2023-08-29 LAB — COMPREHENSIVE METABOLIC PANEL WITH GFR
ALT: 21 U/L (ref 0–44)
AST: 43 U/L — ABNORMAL HIGH (ref 15–41)
Albumin: 2 g/dL — ABNORMAL LOW (ref 3.5–5.0)
Alkaline Phosphatase: 71 U/L (ref 38–126)
Anion gap: 7 (ref 5–15)
BUN: 14 mg/dL (ref 8–23)
CO2: 18 mmol/L — ABNORMAL LOW (ref 22–32)
Calcium: 7.7 mg/dL — ABNORMAL LOW (ref 8.9–10.3)
Chloride: 107 mmol/L (ref 98–111)
Creatinine, Ser: 0.73 mg/dL (ref 0.44–1.00)
GFR, Estimated: >60 mL/min (ref >60)
Glucose, Bld: 82 mg/dL (ref 70–99)
Potassium: 3 mmol/L — ABNORMAL LOW (ref 3.5–5.1)
Sodium: 132 mmol/L — ABNORMAL LOW (ref 135–145)
Total Bilirubin: 0.7 mg/dL (ref 0.0–1.2)
Total Protein: 5.1 g/dL — ABNORMAL LOW (ref 6.5–8.1)

## 2023-08-29 LAB — C-REACTIVE PROTEIN: CRP: 10.8 mg/dL — ABNORMAL HIGH (ref <1.0)

## 2023-08-29 LAB — MAGNESIUM: Magnesium: 2.1 mg/dL (ref 1.7–2.4)

## 2023-08-29 LAB — PHOSPHORUS: Phosphorus: 2.2 mg/dL — ABNORMAL LOW (ref 2.5–4.6)

## 2023-08-29 MED ORDER — ASPIRIN 81 MG PO TBEC: 81.0000 mg | DELAYED_RELEASE_TABLET | Freq: Every day | ORAL | 0 refills | Status: AC

## 2023-08-29 MED ORDER — SODIUM BICARBONATE 650 MG PO TABS: 650.0000 mg | ORAL_TABLET | Freq: Two times a day (BID) | ORAL | 0 refills | Status: AC

## 2023-08-29 MED ORDER — POTASSIUM CHLORIDE CRYS ER 20 MEQ PO TBCR
40.0000 meq | EXTENDED_RELEASE_TABLET | ORAL | Status: DC
  Administered 2023-08-29: 40 meq via ORAL
  Filled 2023-08-29: qty 2

## 2023-08-29 MED ORDER — SACCHAROMYCES BOULARDII 250 MG PO CAPS: 250.0000 mg | ORAL_CAPSULE | Freq: Two times a day (BID) | ORAL | 0 refills | Status: AC

## 2023-08-29 MED ORDER — DICLOFENAC SODIUM 1 % EX GEL
2.0000 g | Freq: Four times a day (QID) | CUTANEOUS | 0 refills | Status: AC
Start: 2023-08-29 — End: ?

## 2023-08-29 MED ORDER — SODIUM BICARBONATE 650 MG PO TABS
650.0000 mg | ORAL_TABLET | Freq: Two times a day (BID) | ORAL | Status: DC
  Administered 2023-08-29 – 2023-08-30 (×2): 650 mg via ORAL
  Filled 2023-08-29 (×2): qty 1

## 2023-08-29 MED ORDER — DOXYCYCLINE HYCLATE 100 MG PO TABS: 100.0000 mg | ORAL_TABLET | Freq: Two times a day (BID) | ORAL | 0 refills | Status: AC

## 2023-08-29 MED ORDER — K PHOS MONO-SOD PHOS DI & MONO 155-852-130 MG PO TABS
500.0000 mg | ORAL_TABLET | Freq: Once | ORAL | Status: AC
  Administered 2023-08-29: 500 mg via ORAL
  Filled 2023-08-29: qty 2

## 2023-08-29 MED ORDER — SACCHAROMYCES BOULARDII 250 MG PO CAPS
250.0000 mg | ORAL_CAPSULE | Freq: Two times a day (BID) | ORAL | Status: DC
  Administered 2023-08-29 – 2023-08-30 (×3): 250 mg via ORAL
  Filled 2023-08-29 (×3): qty 1

## 2023-08-29 MED ORDER — POTASSIUM CHLORIDE 20 MEQ PO PACK
40.0000 meq | PACK | ORAL | Status: AC
  Administered 2023-08-29: 40 meq via ORAL
  Filled 2023-08-29 (×2): qty 2

## 2023-08-29 NOTE — Progress Notes (Addendum)
 Referring Provider(s): Dr. Erskin Hearing, MD  Supervising Physician: Fernando Hoyer  Patient Status:  [facility] - In-pt  Chief Complaint: Cough, loculated pleural effusion  Subjective:  Patient is laying comfortably in bed. No new complaints.  Patient denies any fevers, headache, chest pain, SOB, abdominal pain, nausea, vomiting or bleeding.  She is eager to have her chest tube removed.   Allergies: Effexor  [venlafaxine ] and Topamax [topiramate]  Medications: Prior to Admission medications   Medication Sig Start Date End Date Taking? Authorizing Provider  amoxicillin  (AMOXIL ) 875 MG tablet Take 875 mg by mouth 2 (two) times daily. 08/21/23 08/31/23 Yes [provider]  buPROPion  (WELLBUTRIN  XL) 300 MG 24 hr tablet  07/09/17  Yes [provider]  carvedilol  (COREG ) 3.125 MG tablet Take 3.125 mg by mouth 2 (two) times daily with a meal. 05/14/17 08/23/23 Yes [provider]  celecoxib  (CELEBREX ) 100 MG capsule TAKE ONE CAPSULE BY MOUTH TWICE DAILY 12/06/15  Yes Thomes Flicker, MD  Cyanocobalamin  (VITAMIN B-12) 250 MCG TABS Take 250 mcg by mouth daily. 02/12/20  Yes Opalski, Bernardo Bridgeman, DO  gabapentin  (NEURONTIN ) 300 MG capsule Take 300 mg by mouth 3 (three) times daily.   Yes [provider]  HYDROcodone -acetaminophen  (NORCO) 7.5-325 MG tablet Take 1 tablet by mouth every 6 (six) hours as needed for moderate pain (pain score 4-6) or severe pain (pain score 7-10). 12/19/22  Yes [provider]  levothyroxine  (SYNTHROID , LEVOTHROID) 25 MCG tablet Take 75 mcg by mouth.  09/03/15 08/23/23 Yes [provider]  liothyronine  (CYTOMEL ) 5 MCG tablet Take 5 mcg by mouth daily. 11/23/22 02/05/24 Yes [provider]  Multiple Vitamin (MULTIVITAMIN) capsule Take 1 capsule by mouth daily.   Yes [provider]  omeprazole (PRILOSEC) 40 MG capsule Take 40 mg by mouth daily. 06/13/23  Yes [provider]  QUEtiapine  (SEROQUEL ) 50 MG  tablet Take 50 mg by mouth.   Yes [provider]  rosuvastatin  (CRESTOR ) 10 MG tablet Take 10 mg by mouth daily. 09/20/22 09/20/23 Yes [provider]  sulfamethoxazole-trimethoprim (BACTRIM DS) 800-160 MG tablet Take 1 tablet by mouth 2 (two) times daily. 06/18/23  Yes [provider]  Vitamin D , Ergocalciferol , (DRISDOL ) 1.25 MG (50000 UNIT) CAPS capsule Take 1 capsule (50,000 Units total) by mouth every 7 (seven) days. 02/12/20  Yes Opalski, Deborah, DO  Semaglutide, 1 MG/DOSE, 4 MG/3ML SOPN Inject into the skin. 05/07/23   [provider]     Vital Signs: BP 128/80 (BP Location: Left Arm)   Pulse 89   Temp 98.4 F (36.9 C) (Oral)   Resp 18   Ht 5\' 6"  (1.676 m)   Wt 150 lb (68 kg)   SpO2 97%   BMI 24.21 kg/m   Physical Exam Constitutional:      Appearance: Normal appearance.  Cardiovascular:     Rate and Rhythm: Normal rate.  Pulmonary:     Effort: Pulmonary effort is normal.     Comments: Chest tube in place in left flank.  Chest tube drain appropriately dressed. Dressing is clean, dry, intact. Drain incision site is minimally tender, without evidence of infection. Retaining suture in place.  Collection cassette placed to gravity.  Skin:    General: Skin is warm and dry.  Neurological:     Mental Status: She is alert and oriented to person, place, and time.      Labs:  CBC: Recent Labs    08/26/23 1456 08/27/23 8657 08/28/23 0556 08/29/23 8469  WBC 33.8* 24.0* 23.8* 19.3*  HGB 11.7* 10.4* 9.7* 9.4*  HCT 34.0* 30.4* 28.5* 27.6*  PLT 615* 556* 591* 570*    COAGS: Recent Labs    08/23/23 2120  INR 1.5*  APTT 47*    BMP: Recent Labs    08/26/23 1456 08/27/23 0842 08/28/23 0556 08/29/23 0613  NA 127* 132* 131* 132*  K 3.8 3.3* 3.8 3.0*  CL 102 107 106 107  CO2 18* 17* 18* 18*  GLUCOSE 132* 93 90 82  BUN 21 20 19 14   CALCIUM  8.2* 7.8* 7.9* 7.7*  CREATININE 1.14* 0.98 0.73 0.73  GFRNONAA 51* >60 >60 >60     LIVER FUNCTION TESTS: Recent Labs    08/26/23 1456 08/27/23 0842 08/28/23 0556 08/29/23 0613  BILITOT 0.6 0.6 0.7 0.7  AST 18 21 18  43*  ALT 14 14 13 21   ALKPHOS 83 76 66 71  PROT 5.8* 5.2* 5.1* 5.1*  ALBUMIN 2.2* 1.9* 1.9* 2.0*    Assessment and Plan: Chest tube removed per request from MD. Tube removed at the bedside without difficulty. Patient tolerated procedure well. One hour post-removal CXR ordered.  Interval: RN notified IR staff that patient's recently placed occlusive dressing is saturated with moderate amount of serosanguinous fluid output. Patient remains asymptomatic.   CXR this am with small pleural effusion of left lung. Likely the source of the fluid drainage. Per Dr. Marne Sings, please request RN to change dressing as needed. Follow-up CXR will be performed soon, and IR will follow-up if read is abnormal. Otherwise, no immediate or urgent intervention is required.   Care team advised of current plan.    Electronically Signed: Lovena Rubinstein, PA-C 08/29/2023, 1:45 PM     I spent a total of 25 Minutes at the the patient's bedside AND on the patient's hospital floor or unit, greater than 50% of which was counseling/coordinating care for loculated pleural effusion, with consideration for chest tube removal.

## 2023-08-29 NOTE — Hospital Course (Addendum)
 72yo with h/o HTN, HLD, postablative hypothyroidism, depression/anxiety, SVT, and acoustic neuroma who presented on 4/17 with cough, SOB.  She was found to have leukocytosis and a moderate left-sided pleural effusion.  She was started on antibiotics and chest tube was placed with intrapleural lytic administration on 4/19, 4/20, 4/21.  The tube was capped on 4/22 with possible plan for removal on 4/23.

## 2023-08-29 NOTE — Plan of Care (Signed)
 Tamea Faith, LPN

## 2023-08-29 NOTE — Progress Notes (Signed)
       CROSS COVER NOTE  NAME: Barbara Mclaughlin MRN: [id number] DOB : 02-Mar-1951 ATTENDING PHYSICIAN: Lorita Rosa, MD    Date of Service   08/29/2023   HPI/Events of Note   Nurse reports patient with increased work of breathing and large amount of drainage on chest tube site dressing as well as patient appearing anxious. No increase in oxygen demand  Interventions   Assessment/Planpatient endorses feeling shortness of breath and pain with deep inspiration Bedside BBS present, no crepitus Not overtly short of breath in appearance, however, front view it appears asymmetrical Chest xray repeated IMPRESSION: Stable small left pleural effusion and patchy opacities in the left lung base.     Continue current med management Encourage mobility and pulm toilet, IS        Kip Peon NP [facility] Cross Cover 7pm-7am - check amion for availability Pager [phone]

## 2023-08-29 NOTE — Progress Notes (Signed)
 NAME:  Barbara Mclaughlin, MRN:  [id number], DOB:  [birth year], LOS: 6 ADMISSION DATE:  08/23/2023 History of Present Illness:  This is a case of a pleasant 73 year old female patient with a past medical history of hypertension, hyperlipidemia, postablative hypothyroidism, depression with anxiety, anemia who is presenting to [facility] on 0/17 with ongoing cough shortness of breath and pleuritic chest pain.   She reports to me that about 5 weeks ago she started complaining of shortness of breath associated with cough and few episodes of hemoptysis.  She did not have hemoptysis while in house.  She also developed pleuritic chest pain.   On arrival she was hemodynamically stable elevated WBC count was noted at 22,000. CTA chest revealed a moderate to large left loculated pleural effusion. No PE.    Family history - Parents with COPD who were both smokers    Social history - Never Smoker   08/29/23- patient seen at bedside. Reviewed CXR this am looks much improved with minimal effusion.  Chest tube removed by IR today.  Patient and family are pleased with outcome.  She is cleared for dc home with short term follow up.   Pertinent  Medical History  As above   Significant Hospital Events: Including procedures, antibiotic start and stop dates in addition to other pertinent events   08/25/2023 s/p Left sided chest tube placement with fluid analysis consistent with a complicated parapn vs empyema.  08/27/23- patient for 3rd tpa/dornase treatment today. Leukocytosis is improving. She is on room air with normoxia.  Chest tube >440 cc drainage.  I met with family at bedside and reviewed care plan.  08/28/23- patient with no overnight events. Remains with stable vital signs on room air. Her RF is increased while remainder of AI workup is negative thus far (a/ccp still in process), this can occur in context of sub acute infection and advanced age or alternatively patient may have underlying autoimmune process/CTD.  There is repeat CXR done this am with miniam effusion and bibasilar atelectasis. Plan to initate metaneb recruitment today and clamp chest tube to off position for preparation to remove chest tube.     Objective   Blood pressure 128/80, pulse 89, temperature 98.4 F (36.9 C), temperature source Oral, resp. rate 18, height 5\' 6"  (1.676 m), weight 68 kg, SpO2 97%.        Intake/Output Summary (Last 24 hours) at 08/29/2023 0922 Last data filed at 08/29/2023 0552 Gross per 24 hour  Intake 830 ml  Output --  Net 830 ml   Filed Weights   08/23/23 1605  Weight: 68 kg    Examination: General: NAD, a bit more lethargic today.  HENT: Supple neck reactive pupils  Lungs: Diminished at the left base  Cardiovascular: Normal S1, Normal S2, RRR  Abdomen: Soft, non tender, non distended, +BS  Extremities: Warm and well perfused  Chest tube flushed at bedside.   Assessment & Plan:  This is a case of a pleasant 73 year old female patient with a past medical history of hypertension, hyperlipidemia, postablative hypothyroidism, depression with anxiety, anemia who is presenting to [facility] on 0/17 with ongoing cough shortness of breath and pleuritic chest pain. Found to have a large complex left sided pleural effusion.    #1. Left Para-pneumonic effusion   PMN predominant exudate with radiographic split pleura sign characteristic of empyema. The fluid is not mucopurulent grossly and gram stain and culture remains negative thus far. Septated/loculated effusion.  Patient completed tpa/dornase intrapleural therapy.  Patient can  finish antibiotics with recommendation for doxycycline  100mg  bid x 4 more days.  Post hospital pulmonary follow up within 1 wk  #Hemoptysis - non massive - RESOLVED #Large hiatal hernia - chronic stable, GERD/LPR  precautions    Thank you for the interesting consult.  Pulmonary Team will follow.    Costella Schwarz, M.D.  Pulmonary & Critical Care Medicine  [facility]

## 2023-08-29 NOTE — Discharge Summary (Addendum)
 Physician Discharge Summary   Patient: Barbara Mclaughlin MRN: [id number] DOB: 1950/11/15  Admit date:     08/23/2023  Discharge date: 08/29/23  Discharge Physician: Lorita Rosa   PCP: Yehuda Helms, MD   Recommendations at discharge:   You are being discharged home with home health physical therapy Complete antibiotics - doxycycline  twice daily for 4 more days Follow up with Dr. Aleskerov in 1 week Follow up with Dr. Claudius Cumins in 1-2 weeks Hold semaglutide until further notice Take sodium bicarbonate  twice daily for 3 days Take aspirin  81 mg once daily  Discharge Diagnoses: Principal Problem:   Pleural effusion Active Problems:   CAP (community acquired pneumonia)   Lung nodule   Hypertension   SVT (supraventricular tachycardia) (HCC)   Hypothyroidism   HLD (hyperlipidemia)   Anemia, iron deficiency   Depression with anxiety  *Note: Discharge held due to persistent excessive drainage after chest tube removal.  Anticipate dc on 4/24.  Hospital Course: 73yo with h/o HTN, HLD, postablative hypothyroidism, depression/anxiety, SVT, and acoustic neuroma who presented on 4/17 with cough, SOB.  She was found to have leukocytosis and a moderate left-sided pleural effusion.  She was started on antibiotics and chest tube was placed with intrapleural lytic administration on 4/19, 4/20, 4/21.  The tube was capped on 4/22 with possible plan for removal on 4/23.  Assessment and Plan:  Loculated pleural effusion, left Presented with SOB, hemoptysis Loculated pleural effusion of moderate size seen on CTStatus post chest tube placement 4/18 Intrapleural lytics 4/19, 4/20, 4/21 Pulmonology managed the chest tube, which was removed on 4/23 Pleural cultures remain without growth, predominantly PMNs Clinically appears as empyema Treated with azithromycin , cefepime , Flagyl  -> Unasyn   MRSA PCR negative, vancomycin  discontinued 4/19 ANA negative, RF positive, CCP negative Strep pneumo,  Legionella negative Respiratory viral panel negative Blood cultures negative x 5 days Reactive leukocytosis and thrombocytosis are improving Smear review: Unremarkable WBC morphology, normal platelet morphology, some burr cells Change dressing as needed   Altered mentation Suspect this is toxic metabolic encephalopathy secondary to pain medications and possibly cefepime  Was excessively somnolent on 4/20, improved significantly after pain medication discontinuation Confusion is slowly improving No localizing deficits on exam Patient resumed her home dose Seroquel  last night and feels foggy and mildly somnolent today Head CT revealed stable likely-meningioma from 2023, 73 age indeterminate lacunar infarct; no right-sided deficits appreciated on exam Continue statin, add daily ASA   Left-sided chest pain, resolved Sudden onset chest pain 4/19.  Chest x-ray without acute changes.  Troponin 7, EKG not concerning. Chest pain likely secondary to chest tube  Declining kidney function with metabolic acidosis Baseline creatinine 0.78 Increased during hospitalization, now back to baseline Continues with mild metabolic acidosis, possibly related to semaglutide? Add bicarb PO x 3 days   Lung nodules Repeat CT in 3 months   Hyponatremia Not clinically significant  Monitor as outpatient  Hypokalemia Hypophosphatemia Repleted   Hypertension Continue carvedilol    SVT Continue with home dose Coreg  Patient is scheduled for SVT ablation with cardiology outpatient in about a month   Hypothyroidism Continue Synthroid , Cytomel    Hyperlipidemia Continue rosuvastatin    Anemia, iron deficiency Hemoglobin currently stable near baseline   Depression with anxiety Continue bupropion , Seroquel  (although it is not clear if this is for only sleep or also mood)  S/p gastric bypass Continue B12, MVI, D Hold semaglutide   Chronic pain I have reviewed this patient in the [place] Controlled Substances  Reporting System. She is receiving medications  from two providers but appears to be taking them as prescribed. She is not at particularly high risk of opioid misuse, diversion, or overdose.  Continue Vicodin sparingly  DNR Confirmed on admission       Consultants: Pulmonology IR PT   Procedures: Chest tube placement 4/18 Pleural fibrinolytic therapy 4/19, 4/20, 4/21 Chest tube removal 4/23   Antibiotics: Azithromycin  4/17-22 Cefepime  4/17-22 Vancomycin  x 2 doses Unasyn  4/22-  Pain control - [place]  Controlled Substance Reporting System database was reviewed. and patient was instructed, not to drive, operate heavy machinery, perform activities at heights, swimming or participation in water  activities or provide baby-sitting services while on Pain, Sleep and Anxiety Medications; until their outpatient Physician has advised to do so again. Also recommended to not to take more than prescribed Pain, Sleep and Anxiety Medications.    Disposition: Home Diet recommendation:  Regular diet DISCHARGE MEDICATION: Allergies as of 08/29/2023       Reactions   Effexor  [venlafaxine ]    agitation   Topamax [topiramate]    Tingling in arms        Medication List     PAUSE taking these medications    Semaglutide (1 MG/DOSE) 4 MG/3ML Sopn Wait to take this until your doctor or other care provider tells you to start again. Inject into the skin.       STOP taking these medications    amoxicillin  875 MG tablet Commonly known as: AMOXIL    celecoxib  100 MG capsule Commonly known as: CELEBREX    sulfamethoxazole-trimethoprim 800-160 MG tablet Commonly known as: BACTRIM DS       TAKE these medications    aspirin  EC 81 MG tablet Take 1 tablet (81 mg total) by mouth daily. Swallow whole. Start taking on: August 30, 2023   B-12 250 MCG Tabs Take 250 mcg by mouth daily.   buPROPion  300 MG 24 hr tablet Commonly known as: WELLBUTRIN  XL   carvedilol  3.125 MG  tablet Commonly known as: COREG  Take 3.125 mg by mouth 2 (two) times daily with a meal.   diclofenac  Sodium 1 % Gel Commonly known as: VOLTAREN  Apply 2 g topically 4 (four) times daily.   doxycycline  100 MG tablet Commonly known as: VIBRA -TABS Take 1 tablet (100 mg total) by mouth 2 (two) times daily for 4 days.   gabapentin  300 MG capsule Commonly known as: NEURONTIN  Take 300 mg by mouth 3 (three) times daily.   HYDROcodone -acetaminophen  7.5-325 MG tablet Commonly known as: NORCO Take 1 tablet by mouth every 6 (six) hours as needed for moderate pain (pain score 4-6) or severe pain (pain score 7-10).   levothyroxine  25 MCG tablet Commonly known as: SYNTHROID  Take 75 mcg by mouth.   liothyronine  5 MCG tablet Commonly known as: CYTOMEL  Take 5 mcg by mouth daily.   multivitamin capsule Take 1 capsule by mouth daily.   omeprazole 40 MG capsule Commonly known as: PRILOSEC Take 40 mg by mouth daily.   QUEtiapine  50 MG tablet Commonly known as: SEROQUEL  Take 50 mg by mouth.   rosuvastatin  10 MG tablet Commonly known as: CRESTOR  Take 10 mg by mouth daily.   saccharomyces boulardii 250 MG capsule Commonly known as: FLORASTOR Take 1 capsule (250 mg total) by mouth 2 (two) times daily.   sodium bicarbonate  650 MG tablet Take 1 tablet (650 mg total) by mouth 2 (two) times daily for 3 days.   Vitamin D  (Ergocalciferol ) 1.25 MG (50000 UNIT) Caps capsule Commonly known as: DRISDOL  Take 1 capsule (50,000  Units total) by mouth every 7 (seven) days.               Discharge Care Instructions  (From admission, onward)           Start     Ordered   08/29/23 0000  Change dressing (specify)       Comments: Dressing changes as needed when saturated.   08/29/23 1542            Follow-up Information     Sparks, Rosalynn Come, MD Follow up.   Specialty: Internal Medicine Why: Hospital follow up Contact information: [address] [facility]  [place] [address] [phone]                Discharge Exam:   Subjective: Feels good and really wants to go home.  On room air.  Still had chest tube, which has since been pulled.  She is having a lot of drainage which IR anticipates will abate soon.   Objective: Vitals:   08/29/23 0026 08/29/23 0507  BP: 124/72 128/80  Pulse: 84 89  Resp: 18 18  Temp: 98.6 F (37 C) 98.4 F (36.9 C)  SpO2: 97% 97%    Intake/Output Summary (Last 24 hours) at 08/29/2023 1542 Last data filed at 08/29/2023 1419 Gross per 24 hour  Intake 970 ml  Output --  Net 970 ml   Filed Weights   08/23/23 1605  Weight: 68 kg    Exam:  General:  Appears calm and comfortable and is in NAD Eyes:   EOMI, normal lids, iris ENT:  grossly normal hearing, lips & tongue, mmm Cardiovascular:  RRR. No LE edema.  Respiratory:   CTA bilaterally with no wheezes/rales/rhonchi.  Normal respiratory effort.  L-sided chest tube with dressing C/D/I Abdomen:  soft, NT, ND Skin:  no rash or induration seen on limited exam Musculoskeletal:  grossly normal tone BUE/BLE, good ROM, no bony abnormality Psychiatric:  grossly normal mood and affect, speech fluent and appropriate, AOx3 Neurologic:  CN 2-12 grossly intact, moves all extremities in coordinated fashion  Data Reviewed: I have reviewed the patient's lab results since admission.  Pertinent labs for today include:  Na++ 132, improving K+ 3.0 CO2 18, stable Phos 2.2 Albumin 2.0 AST 43/ALT 21/Bili 0.7 WBC 19.3, improving Hgb 9.4, down from 11 on 4/20 Platelets 570, improving     Condition at discharge: improving  The results of significant diagnostics from this hospitalization (including imaging, microbiology, ancillary and laboratory) are listed below for reference.   Imaging Studies: DG Chest Port 1 View Result Date: 08/29/2023 CLINICAL DATA:  [id number] Encounter for chest tube removal [id number] EXAM: PORTABLE CHEST 1 VIEW COMPARISON:  Chest  XR, 08/29/2023. IR fluoroscopy, 08/24/2023. CT chest, 08/23/2023 FINDINGS: Cardiac silhouette is within normal limits. Aortic atherosclerosis. The RIGHT lung is clear and well inflated. Removal of LEFT pigtail thoracostomy tube without residual pleural effusion. No pneumothorax. No acute osseous abnormality. IMPRESSION: Removal LEFT pigtail thoracostomy tube without residual pleural effusion. No pneumothorax. Electronically Signed   By: Art Largo M.D.   On: 08/29/2023 14:20   DG Chest Port 1 View Result Date: 08/29/2023 CLINICAL DATA:  Chest tube removal. EXAM: PORTABLE CHEST 1 VIEW COMPARISON:  August 28, 2023. FINDINGS: Stable cardiomediastinal silhouette. Stable left-sided chest tube is noted without pneumothorax. Small left pleural effusion is noted with adjacent atelectasis or scarring. Minimal right basilar atelectasis is noted. IMPRESSION: Stable left-sided chest tube without pneumothorax. Stable small left pleural effusion  with adjacent atelectasis or scarring. Electronically Signed   By: Rosalene Colon M.D.   On: 08/29/2023 09:15   CT HEAD WO CONTRAST ( ) Result Date: 08/28/2023 CLINICAL DATA:  73 year old female with altered mental status. Cough, shortness of breath. EXAM: CT HEAD WITHOUT CONTRAST TECHNIQUE: Contiguous axial images were obtained from the base of the skull through the vertex without intravenous contrast. RADIATION DOSE REDUCTION: This exam was performed according to the departmental dose-optimization program which includes automated exposure control, adjustment of the mA and/or kV according to patient size and/or use of iterative reconstruction technique. COMPARISON:  Brain MRI 02/13/2022. FINDINGS: Brain: Chronic right lower cerebellopontine angle extra-axial mass which was homogeneously enhancing in 2023 is partially calcified on series 3, image 8 and grossly stable, about 10 mm diameter. No associated brainstem or cerebellar mass effect or edema. Underlying brain volume  remains normal for age. No midline shift, ventriculomegaly, mass effect, intracranial hemorrhage or evidence of cortically based acute infarction. Chronic cerebral white matter disease with patchy bilateral hemispheric involvement is stable since 2023. However, a small the are of the left thalamus is new since that time (series 3, image 16). Right inferior lentiform perivascular space is stable. Vascular: No suspicious intracranial vascular hyperdensity. Mild Calcified atherosclerosis at the skull base. Skull: Intact. Osteopenia. No acute osseous abnormality identified. Sinuses/Orbits: Visualized paranasal sinuses and mastoids are clear. Other: No acute orbit or scalp soft tissue finding. IMPRESSION: 1. Age indeterminate lacunar infarct of the left thalamus, new from a 2023 MRI. Query acute right side deficits. 2. No other acute intracranial abnormality. Chronic white matter disease., and small chronic 1 cm probable Meningioma of the right lower cerebellopontine angle. Electronically Signed   By: Marlise Simpers M.D.   On: 08/28/2023 15:21   DG Chest Port 1 View Result Date: 08/28/2023 CLINICAL DATA:  Shortness of breath EXAM: PORTABLE CHEST 1 VIEW COMPARISON:  08/26/2023 FINDINGS: Cardiac shadow is stable. Left pigtail catheter is again seen. No pneumothorax is noted. Small left pleural effusion is again seen. Mild bibasilar atelectatic changes are again noted. IMPRESSION: No significant interval change from the prior exam. Electronically Signed   By: Violeta Grey M.D.   On: 08/28/2023 11:17   DG Chest Port 1 View Result Date: 08/26/2023 CLINICAL DATA:  73 year old female with partially loculated left pleural effusion, chest tube. EXAM: PORTABLE CHEST 1 VIEW COMPARISON:  Portable chest yesterday and earlier. FINDINGS: Portable AP upright view at 0823 hours. Continued low lung volumes. Stable left pigtail chest tube at the lateral costophrenic angle. Stable cardiac size and mediastinal contours. Moderate hiatal hernia  demonstrated recently by CT. Regression of left pleural effusion since 08/23/2023, and also decreased since yesterday. No pneumothorax or pulmonary edema. Otherwise stable lung base hypo ventilation. Stable visualized osseous structures. Stable visible bowel gas. IMPRESSION: Decreasing left pleural effusion with chest tube in place. No pneumothorax. Stable lung base hypoventilation. Electronically Signed   By: Marlise Simpers M.D.   On: 08/26/2023 12:15   DG Chest Port 1 View Result Date: 08/25/2023 CLINICAL DATA:  [id number] Chest tube in place [id number] EXAM: PORTABLE CHEST - 1 VIEW COMPARISON:  08/25/2023 FINDINGS: Stable pigtail chest tube at the left lung base. No pneumothorax. Persistent left effusion. Patchy opacities at the lung bases left greater than right stable. Heart size and mediastinal contours are within normal limits. Visualized bones unremarkable. IMPRESSION: 1. Stable left chest tube. No pneumothorax. 2. Persistent left effusion and bibasilar opacities. Electronically Signed   By: Lamond Pilot.D.  On: 08/25/2023 18:39   DG Chest Port 1 View Result Date: 08/25/2023 CLINICAL DATA:  Chest tube.  Follow-up. EXAM: PORTABLE CHEST 1 VIEW COMPARISON:  10/03/2014 radiography. Images from catheter placement yesterday. CT 08/23/2023. FINDINGS: Heart size is normal. Chronic hiatal hernia again shown. Aortic atherosclerosis and tortuosity. Mild chronic markings in the right lung but no significant finding. Pleural catheter in place on the left at the inferior pleural space. Probable slight reduction in pleural density when compared to the CT scan. No visible pleural air. IMPRESSION: 1. Pleural catheter in place on the left at the inferior pleural space. Probable slight reduction in pleural density when compared to the CT scan. No visible pleural air. 2. Chronic hiatal hernia. Aortic atherosclerosis and tortuosity. Electronically Signed   By: Bettylou Brunner M.D.   On: 08/25/2023 11:42   IR PERC PLEURAL DRAIN  W/INDWELL CATH W/IMG GUIDE Result Date: 08/24/2023 INDICATION: 73 year old presented with productive cough and shortness of breath. Chest CT demonstrated a loculated left pleural effusion. Request for left chest tube placement. EXAM: IMAGE GUIDED PLACEMENT OF LEFT CHEST TUBE MEDICATIONS: Moderate sedation ANESTHESIA/SEDATION: Moderate (conscious) sedation was employed during this procedure. A total of Versed  2 mg and Fentanyl  100 mcg was administered intravenously by the radiology nurse. Total intra-service moderate Sedation Time: 18 minutes. The patient's level of consciousness and vital signs were monitored continuously by radiology nursing throughout the procedure under my direct supervision. COMPLICATIONS: None immediate. FLUOROSCOPY TIME:  Radiation Exposure Index (as provided by the fluoroscopic device): 1.9 mGy Kerma PROCEDURE: Informed written consent was obtained from the patient after a thorough discussion of the procedural risks, benefits and alternatives. All questions were addressed. Maximal Sterile Barrier Technique was utilized including caps, mask, sterile gowns, sterile gloves, sterile drape, hand hygiene and skin antiseptic. A timeout was performed prior to the initiation of the procedure. Patient was placed supine on the interventional table. The left mid axillary region was prepped and draped in sterile fashion. Ultrasound demonstrated loculated left pleural effusion. Left mid axillary region was anesthetized using 1% lidocaine . A small incision was made. Using ultrasound guidance, a Yueh catheter was directed into the loculated pleural fluid and straw-colored fluid was aspirated. Superstiff Amplatz wire was advanced into the pleural space and the tract was dilated to accommodate a 14 [place] multipurpose drain. Fluid was collected for labs. Chest tube was attached to a chest drainage system. Chest tube was sutured to the skin. Dressing was placed. Fluoroscopic and ultrasound images were taken  and saved for documentation. FINDINGS: Ultrasound demonstrated a loculated left pleural effusion. Large fluid pocket along the posterior left mid axillary region was targeted. Straw-colored fluid was aspirated. Chest tube was in the lower portion of the left chest at the end of the procedure. IMPRESSION: Image guided placement of a left chest tube. Electronically Signed   By: Elene Griffes M.D.   On: 08/24/2023 16:41   US  CHEST (PLEURAL EFFUSION) Result Date: 08/24/2023 CLINICAL DATA:  Evaluate left pleural effusion for loculations and possible chest tube placement. EXAM: CHEST ULTRASOUND COMPARISON:  Chest CT 08/23/2023 FINDINGS: Moderate sized left pleural effusion with scattered septations. There are some large pockets of fluid. IMPRESSION: Moderate sized loculated left pleural effusion. Electronically Signed   By: Elene Griffes M.D.   On: 08/24/2023 14:16   CT Angio Chest PE W and/or Wo Contrast Result Date: 08/23/2023 CLINICAL DATA:  Productive cough for 2 weeks with shortness of breath and chest pain, clinically suspected pulmonary embolism. EXAM: CT ANGIOGRAPHY CHEST WITH  CONTRAST TECHNIQUE: Multidetector CT imaging of the chest was performed using the standard protocol during bolus administration of intravenous contrast. Multiplanar CT image reconstructions and MIPs were obtained to evaluate the vascular anatomy. RADIATION DOSE REDUCTION: This exam was performed according to the departmental dose-optimization program which includes automated exposure control, adjustment of the mA and/or kV according to patient size and/or use of iterative reconstruction technique. CONTRAST:  75mL OMNIPAQUE  IOHEXOL  350 MG/ML SOLN COMPARISON:  The last chest film available for direct comparison is portable chest 10/03/2014. No prior chest CT. The most recent study, with only the report available is a rib series and PA chest from 03/21/2022 performed at [facility]. Limited comparison is available with CT abdomen and pelvis with  contrast 10/26/2016. FINDINGS: Cardiovascular: The pulmonary arteries are normal in caliber without evidence of arterial emboli or right heart strain. Central pulmonary veins are borderline prominent. The heart has slightly enlarged since 2018, with a small pericardial effusion also new. There are trace single vessel calcific plaques proximal LAD coronary artery. The aorta and great vessels are tortuous but normal in caliber with mild patchy aortic and minimal great vessel calcifications. There is no aortic dissection or stenosis. Mediastinum/Nodes: There are slightly prominent prevascular and left hilar lymph nodes both up to 1.1 cm short axis, precarinal lymph nodes to 1.2 cm, subcarinal nodal complex 1.4 cm, few right hilar lymph nodes up to 1.1 cm. There is no bulky or encasing adenopathy. Thyroid  gland, axillary spaces are unremarkable. Thoracic esophagus is unremarkable. Moderate-to-large sized hiatal hernia is increased in prominence since 2018. Within the hernia there is an intrathoracic gastric remnant status post old gastric bypass with the gastrojejunostomy within the sac. Chronic asymmetric elevation again noted right hemidiaphragm. The trachea and main bronchi are clear. Lungs/Pleura: There is a moderate-sized left pleural effusion. Most of the effusion is either posterior or sub pulmonic. A portion of the fluid tracks laterally and cephalad and a small portion of the fluid is loculated in the left apex. On the right there is a small layering pleural effusion without loculations. There is compressive atelectasis medial right lower lobe alongside the hiatal hernia. Posterior dependent atelectasis also noted. There is bronchial thickening in the lower lobes. In the left lower lobe there is scattered subsegmental bronchial impaction and there is consolidation or compressive collapse of the basal segments as well as of the dorsal lingula. On the right, there is a roughly rhomboid shaped well-circumscribed  apical subpleural nodule containing an ectatic bronchiole and measuring 1 x 0.8 cm. This may represent a nodular scar but is indeterminate. Two other tiny nodules in the posterior segment of the right upper lobe, both measuring 3 mm, were noted on 6:41 and 6:58. The rest of the lungs are generally clear. Upper Abdomen: No acute abnormality. Abdominal aortic atherosclerosis. Musculoskeletal: Reverse S shaped thoracolumbar scoliosis noted with osteopenia and thoracic spondylosis. Multilevel degenerative discs. No acute or other significant osseous findings. Unremarkable visualized chest wall. The Review of the MIP images confirms the above findings. IMPRESSION: 1. No evidence of arterial emboli or right heart strain. 2. Slight cardiomegaly with small pericardial effusion. Borderline prominent pulmonary veins. 3. Aortic and trace coronary artery atherosclerosis. 4. Moderate-sized left pleural effusion, mostly posterior or subpulmonic, with a small portion of the fluid loculated in the left apex. 5. Small right pleural effusion without loculations. Atelectasis right lower lobe. 6. Bronchial thickening in the lower lobes with scattered subsegmental bronchial impaction in the left lower lobe, and consolidation or compressive collapse of  the left lower lobe basal segments and dorsal lingula. 7. 1 x 0.8 cm right apical subpleural nodule containing an ectatic bronchiole. This may represent a nodular scar but is indeterminate. Per Fleischner Society Guidelines, consider a non-contrast Chest CT at 3 months, a PET/CT, or tissue sampling. These guidelines do not apply to immunocompromised patients and patients with cancer. Follow up in patients with significant comorbidities as clinically warranted. For lung cancer screening, adhere to Lung-RADS guidelines. Reference: Radiology. 2017; 284(1):228-43. 8. Two other tiny 3 mm nodules in the posterior segment of the right upper lobe. 9. Osteopenia, scoliosis and degenerative change.  Aortic Atherosclerosis (ICD10-I70.0).  Or Electronically Signed   By: Denman Fischer M.D.   On: 08/23/2023 20:51    Microbiology: Results for orders placed or performed during the hospital encounter of 08/23/23  Blood culture (routine x 2)     Status: None   Collection Time: 08/23/23  5:45 PM   Specimen: BLOOD  Result Value Ref Range Status   Specimen Description BLOOD LEFT ANTECUBITAL  Final   Special Requests   Final    BOTTLES DRAWN AEROBIC AND ANAEROBIC Blood Culture adequate volume   Culture   Final    NO GROWTH 5 DAYS Performed at [facility], [address] [place], [place] [address]    Report Status 08/28/2023 FINAL  Final  Blood culture (routine x 2)     Status: None   Collection Time: 08/23/23  5:45 PM   Specimen: BLOOD  Result Value Ref Range Status   Specimen Description BLOOD BLOOD RIGHT WRIST  Final   Special Requests   Final    BOTTLES DRAWN AEROBIC AND ANAEROBIC Blood Culture results may not be optimal due to an inadequate volume of blood received in culture bottles   Culture   Final    NO GROWTH 5 DAYS Performed at [facility], [address] [place], [place] [address]    Report Status 08/28/2023 FINAL  Final  Resp panel by RT-PCR (RSV, Flu A&B, Covid) Anterior Nasal Swab     Status: None   Collection Time: 08/23/23  8:45 PM   Specimen: Anterior Nasal Swab  Result Value Ref Range Status   SARS Coronavirus 2 by RT PCR NEGATIVE NEGATIVE Final    Comment: (NOTE) SARS-CoV-2 target nucleic acids are NOT DETECTED.  The SARS-CoV-2 RNA is generally detectable in upper respiratory specimens during the acute phase of infection. The lowest concentration of SARS-CoV-2 viral copies this assay can detect is 138 copies/mL. A negative result does not preclude SARS-Cov-2 infection and should not be used as the sole basis for treatment or other patient management decisions. A negative result may occur with  improper specimen collection/handling,  submission of specimen other than nasopharyngeal swab, presence of viral mutation(s) within the areas targeted by this assay, and inadequate number of viral copies(<138 copies/mL). A negative result must be combined with clinical observations, patient history, and epidemiological information. The expected result is Negative.  Fact Sheet for Patients:  BloggerCourse.com  Fact Sheet for Healthcare Providers:  SeriousBroker.it  This test is no t yet approved or cleared by the [place]  FDA and  has been authorized for detection and/or diagnosis of SARS-CoV-2 by FDA under an Emergency Use Authorization (EUA). This EUA will remain  in effect (meaning this test can be used) for the duration of the COVID-19 declaration under Section 564(b)(1) of the Act, 21 U.S.C.section 360bbb-3(b)(1), unless the authorization is terminated  or revoked sooner.  Influenza A by PCR NEGATIVE NEGATIVE Final   Influenza B by PCR NEGATIVE NEGATIVE Final    Comment: (NOTE) The Xpert Xpress SARS-CoV-2/FLU/RSV plus assay is intended as an aid in the diagnosis of influenza from Nasopharyngeal swab specimens and should not be used as a sole basis for treatment. Nasal washings and aspirates are unacceptable for Xpert Xpress SARS-CoV-2/FLU/RSV testing.  Fact Sheet for Patients: BloggerCourse.com  Fact Sheet for Healthcare Providers: SeriousBroker.it  This test is not yet approved or cleared by the [place]  FDA and has been authorized for detection and/or diagnosis of SARS-CoV-2 by FDA under an Emergency Use Authorization (EUA). This EUA will remain in effect (meaning this test can be used) for the duration of the COVID-19 declaration under Section 564(b)(1) of the Act, 21 U.S.C. section 360bbb-3(b)(1), unless the authorization is terminated or revoked.     Resp Syncytial Virus by PCR NEGATIVE  NEGATIVE Final    Comment: (NOTE) Fact Sheet for Patients: BloggerCourse.com  Fact Sheet for Healthcare Providers: SeriousBroker.it  This test is not yet approved or cleared by the [place]  FDA and has been authorized for detection and/or diagnosis of SARS-CoV-2 by FDA under an Emergency Use Authorization (EUA). This EUA will remain in effect (meaning this test can be used) for the duration of the COVID-19 declaration under Section 564(b)(1) of the Act, 21 U.S.C. section 360bbb-3(b)(1), unless the authorization is terminated or revoked.  Performed at [facility], [address] [place], [place] [address]   Body fluid culture w Gram Stain     Status: None   Collection Time: 08/24/23  9:41 AM   Specimen: Pleura; Body Fluid  Result Value Ref Range Status   Specimen Description   Final    PLEURAL Performed at [facility], [address] [place], [place] [address]    Special Requests   Final    NONE Performed at [facility], [address] [place], [place] [address]    Gram Stain   Final    RARE WBC PRESENT, PREDOMINANTLY PMN NO ORGANISMS SEEN    Culture   Final    NO GROWTH 3 DAYS Performed at [facility], [address], [place], [place] [address]    Report Status 08/28/2023 FINAL  Final  MRSA Next Gen by PCR, Nasal     Status: None   Collection Time: 08/25/23  3:05 PM   Specimen: Nasal Mucosa; Nasal Swab  Result Value Ref Range Status   MRSA by PCR Next Gen NOT DETECTED NOT DETECTED Final    Comment: (NOTE) The GeneXpert MRSA Assay (FDA approved for NASAL specimens only), is one component of a comprehensive MRSA colonization surveillance program. It is not intended to diagnose MRSA infection nor to guide or monitor treatment for MRSA infections. Test performance is not FDA approved in patients less than 68 years old. Performed at [facility], [address] [place], [place] [address]     Labs: CBC: Recent Labs  Lab 08/25/23 [phone] 08/26/23 0424 08/26/23 1456 08/27/23 0842 08/28/23 0556 08/29/23 0613  WBC 17.3* 34.0* 33.8* 24.0* 23.8* 19.3*  NEUTROABS 13.8*  --  29.8* 20.3* 19.5* 14.6*  HGB 9.5* 11.0* 11.7* 10.4* 9.7* 9.4*  HCT 28.4* 33.2* 34.0* 30.4* 28.5* 27.6*  MCV 89.9 92.0 89.0 90.2 90.2 89.9  PLT 461* 558* 615* 556* 591* 570*   Basic Metabolic Panel: Recent Labs  Lab 08/26/23 0424 08/26/23 1456 08/27/23 0600 08/27/23 0842 08/28/23 0556 08/29/23 0613  NA  130* 127*  --  132* 131* 132*  K 3.5 3.8  --  3.3* 3.8 3.0*  CL 101 102  --  107 106 107  CO2 18* 18*  --  17* 18* 18*  GLUCOSE 101* 132*  --  93 90 82  BUN 18 21  --  20 19 14   CREATININE 1.01* 1.14*  --  0.98 0.73 0.73  CALCIUM  8.1* 8.2*  --  7.8* 7.9* 7.7*  MG 2.2  --  2.2  --  2.1 2.1  PHOS 4.7*  --  3.5  --  2.5 2.2*   Liver Function Tests: Recent Labs  Lab 08/26/23 0424 08/26/23 1456 08/27/23 0842 08/28/23 0556 08/29/23 0613  AST 15 18 21 18  43*  ALT 11 14 14 13 21   ALKPHOS 79 83 76 66 71  BILITOT 0.6 0.6 0.6 0.7 0.7  PROT 5.3* 5.8* 5.2* 5.1* 5.1*  ALBUMIN 2.0* 2.2* 1.9* 1.9* 2.0*   CBG: No results for input(s): "GLUCAP" in the last 168 hours.  Discharge time spent: greater than 30 minutes.  Signed: Lorita Rosa, MD [facility] 08/29/2023

## 2023-08-29 NOTE — Consult Note (Signed)
 PHARMACY CONSULT NOTE - ELECTROLYTES  Pharmacy Consult for Electrolyte Monitoring and Replacement   Recent Labs: Height: 5\' 6"  (167.6 cm) Weight: 68 kg (150 lb) IBW/kg (Calculated) : 59.3 Estimated Creatinine Clearance: 58.6 mL/min (by C-G formula based on SCr of 0.73 mg/dL). Potassium (mmol/L)  Date Value  08/29/2023 3.0 (L)  07/02/2013 3.2 (L)   Magnesium  (mg/dL)  Date Value  08/65/7846 2.1   Calcium  (mg/dL)  Date Value  96/29/5284 7.7 (L)   Calcium , Total (mg/dL)  Date Value  13/24/4010 8.5   Albumin (g/dL)  Date Value  27/25/3664 2.0 (L)   Phosphorus (mg/dL)  Date Value  40/34/7425 2.2 (L)   Sodium (mmol/L)  Date Value  08/29/2023 132 (L)  07/02/2013 133 (L)   Corrected Ca: 9.3 mg/dL  Assessment  Barbara Mclaughlin is a 73 y.o. female presenting with hemoptysis in setting of loculated pleural effusion, imaging characteristics consistent with empyema PMH significant for HTN, HLD, hypothyroid, history of SVT, anemia. Pharmacy has been consulted to monitor and replace electrolytes.  Diet: Regular MIVF: N/A Pertinent medications: N/A  Goal of Therapy: Electrolytes WNL  Plan:  K 3.0 >> potassium chloride  40 mEq PO q3H for 2 doses Phos 2.2 >> K Phos  Neutral tabs PO 500mg  for 1 dose Check BMP, Mg, Phos with AM labs  Thank you for allowing pharmacy to be a part of this patient's care.  Will M. Alva Jewels, PharmD Clinical Pharmacist 08/29/2023 11:48 AM

## 2023-08-30 DIAGNOSIS — J90 Pleural effusion, not elsewhere classified: Secondary | ICD-10-CM | POA: Diagnosis not present

## 2023-08-30 LAB — COMPREHENSIVE METABOLIC PANEL WITH GFR
ALT: 33 U/L (ref 0–44)
AST: 62 U/L — ABNORMAL HIGH (ref 15–41)
Albumin: 2.2 g/dL — ABNORMAL LOW (ref 3.5–5.0)
Alkaline Phosphatase: 67 U/L (ref 38–126)
Anion gap: 8 (ref 5–15)
BUN: 11 mg/dL (ref 8–23)
CO2: 20 mmol/L — ABNORMAL LOW (ref 22–32)
Calcium: 8 mg/dL — ABNORMAL LOW (ref 8.9–10.3)
Chloride: 107 mmol/L (ref 98–111)
Creatinine, Ser: 0.75 mg/dL (ref 0.44–1.00)
GFR, Estimated: >60 mL/min (ref >60)
Glucose, Bld: 113 mg/dL — ABNORMAL HIGH (ref 70–99)
Potassium: 3.2 mmol/L — ABNORMAL LOW (ref 3.5–5.1)
Sodium: 135 mmol/L (ref 135–145)
Total Bilirubin: 0.3 mg/dL (ref 0.0–1.2)
Total Protein: 5.4 g/dL — ABNORMAL LOW (ref 6.5–8.1)

## 2023-08-30 LAB — CBC WITH DIFFERENTIAL/PLATELET
Abs Immature Granulocytes: 1.13 10*3/uL — ABNORMAL HIGH (ref 0.00–0.07)
Basophils Absolute: 0.1 10*3/uL (ref 0.0–0.1)
Basophils Relative: 1 %
Eosinophils Absolute: 0.3 10*3/uL (ref 0.0–0.5)
Eosinophils Relative: 1 %
HCT: 27.7 % — ABNORMAL LOW (ref 36.0–46.0)
Hemoglobin: 9.5 g/dL — ABNORMAL LOW (ref 12.0–15.0)
Immature Granulocytes: 6 %
Lymphocytes Relative: 9 %
Lymphs Abs: 1.6 10*3/uL (ref 0.7–4.0)
MCH: 30 pg (ref 26.0–34.0)
MCHC: 34.3 g/dL (ref 30.0–36.0)
MCV: 87.4 fL (ref 80.0–100.0)
Monocytes Absolute: 2 10*3/uL — ABNORMAL HIGH (ref 0.1–1.0)
Monocytes Relative: 11 %
Neutro Abs: 13.4 10*3/uL — ABNORMAL HIGH (ref 1.7–7.7)
Neutrophils Relative %: 72 %
Platelets: 625 10*3/uL — ABNORMAL HIGH (ref 150–400)
RBC: 3.17 MIL/uL — ABNORMAL LOW (ref 3.87–5.11)
RDW: 13.3 % (ref 11.5–15.5)
WBC: 18.5 10*3/uL — ABNORMAL HIGH (ref 4.0–10.5)
nRBC: 0 % (ref 0.0–0.2)

## 2023-08-30 LAB — MAGNESIUM: Magnesium: 2 mg/dL (ref 1.7–2.4)

## 2023-08-30 LAB — PHOSPHORUS: Phosphorus: 2 mg/dL — ABNORMAL LOW (ref 2.5–4.6)

## 2023-08-30 MED ORDER — POTASSIUM CHLORIDE CRYS ER 20 MEQ PO TBCR
40.0000 meq | EXTENDED_RELEASE_TABLET | Freq: Once | ORAL | Status: AC
  Administered 2023-08-30: 40 meq via ORAL
  Filled 2023-08-30: qty 2

## 2023-08-30 MED ORDER — K PHOS MONO-SOD PHOS DI & MONO 155-852-130 MG PO TABS
500.0000 mg | ORAL_TABLET | Freq: Once | ORAL | Status: AC
  Administered 2023-08-30: 500 mg via ORAL
  Filled 2023-08-30: qty 2

## 2023-08-30 NOTE — Progress Notes (Signed)
 Mobility Specialist - Progress Note    08/30/23 0900  Mobility  Activity Ambulated with assistance in hallway;Stood at bedside  Level of Assistance Standby assist, set-up cues, supervision of patient - no hands on  Assistive Device Front wheel walker  Distance Ambulated (ft) 120 ft  Range of Motion/Exercises Active  Activity Response Tolerated well  Mobility Referral Yes  Mobility visit 1 Mobility  Mobility Specialist Start Time (ACUTE ONLY) [id number]  Mobility Specialist Stop Time (ACUTE ONLY) [id number]  Mobility Specialist Time Calculation (min) (ACUTE ONLY) 18 min   Pt resting in recliner on RA upon entry. Pt STS and ambulates to hallway around NS SBA with RW. Pt endorses SOB and takes x2 standing rest breaks but recovers quickly each time. Pt gait is slow but, moderately steady with no buckling. Pt returned to recliner and left with needs in reach. Pt family present at bedside.   Jerri Morale Mobility Specialist 08/30/23, 9:32 AM

## 2023-08-30 NOTE — Discharge Summary (Signed)
 Physician Discharge Summary   Patient: Barbara Mclaughlin MRN: [id number] DOB: Oct 28, 1950  Admit date:     08/23/2023  Discharge date: 08/30/23  Discharge Physician: Lorita Rosa   PCP: Yehuda Helms, MD   Recommendations at discharge:   You are being discharged home with home health physical therapy Complete antibiotics - doxycycline  twice daily for 4 more days Follow up with Dr. Aleskerov in 1 week Follow up with Dr. Claudius Cumins in 1-2 weeks Hold semaglutide until further notice Take sodium bicarbonate  twice daily for 3 days Take aspirin  81 mg once daily LFTs are very mildly increased and will need to be rechecked with electrolytes at follow up appointment(s)  Discharge Diagnoses: Principal Problem:   Pleural effusion Active Problems:   CAP (community acquired pneumonia)   Lung nodule   Hypertension   SVT (supraventricular tachycardia) (HCC)   Hypothyroidism   HLD (hyperlipidemia)   Anemia, iron deficiency   Depression with anxiety   Hospital Course: 72yo with h/o HTN, HLD, postablative hypothyroidism, depression/anxiety, SVT, and acoustic neuroma who presented on 4/17 with cough, SOB.  She was found to have leukocytosis and a moderate left-sided pleural effusion.  She was started on antibiotics and chest tube was placed with intrapleural lytic administration on 4/19, 4/20, 4/21.  The tube was capped on 4/22 with possible plan for removal on 4/23.  Assessment and Plan:  Loculated pleural effusion, left Presented with SOB, hemoptysis Loculated pleural effusion of moderate size seen on CTStatus post chest tube placement 4/18 Intrapleural lytics 4/19, 4/20, 4/21 Pulmonology managed the chest tube, which was removed on 4/23 Pleural cultures remain without growth, predominantly PMNs Clinically appears as empyema Treated with azithromycin , cefepime , Flagyl  -> Unasyn   MRSA PCR negative, vancomycin  discontinued 4/19 ANA negative, RF positive, CCP negative Strep pneumo,  Legionella negative Respiratory viral panel negative Blood cultures negative x 5 days Reactive leukocytosis and thrombocytosis are improving Smear review: Unremarkable WBC morphology, normal platelet morphology, some burr cells Change dressing as needed   Altered mentation Suspect this is toxic metabolic encephalopathy secondary to pain medications and possibly cefepime  Was excessively somnolent on 4/20, improved significantly after pain medication discontinuation Confusion is slowly improving No localizing deficits on exam Patient resumed her home dose Seroquel  last night and feels foggy and mildly somnolent today Head CT revealed stable likely-meningioma from 2023, age indeterminate lacunar infarct; no right-sided deficits appreciated on exam Continue statin, add daily ASA   Left-sided chest pain, resolved Sudden onset chest pain 4/19.  Chest x-ray without acute changes.  Troponin 7, EKG not concerning. Chest pain likely secondary to chest tube   Declining kidney function with metabolic acidosis Baseline creatinine 0.78 Increased during hospitalization, now back to baseline Continues with mild metabolic acidosis, possibly related to semaglutide? Add bicarb PO x 3 days   Lung nodules Repeat CT in 3 months   Hyponatremia Not clinically significant  Monitor as outpatient   Hypokalemia Hypophosphatemia Repleted   Hypertension Continue carvedilol    SVT Continue with home dose Coreg  Patient is scheduled for SVT ablation with cardiology outpatient in about a month   Hypothyroidism Continue Synthroid , Cytomel    Hyperlipidemia Continue rosuvastatin    Anemia, iron deficiency Hemoglobin currently stable near baseline   Depression with anxiety Continue bupropion , Seroquel  (although it is not clear if this is for only sleep or also mood)   S/p gastric bypass Continue B12, MVI, D Hold semaglutide    Chronic pain I have reviewed this patient in the [place] Controlled  Substances Reporting System.  She is receiving medications from two providers but appears to be taking them as prescribed. She is not at particularly high risk of opioid misuse, diversion, or overdose.  Continue Vicodin sparingly   DNR Confirmed on admission         Consultants: Pulmonology IR PT   Procedures: Chest tube placement 4/18 Pleural fibrinolytic therapy 4/19, 4/20, 4/21 Chest tube removal 4/23   Antibiotics: Azithromycin  4/17-22 Cefepime  4/17-22 Vancomycin  x 2 doses Unasyn  4/22-  Pain control - [place]  Controlled Substance Reporting System database was reviewed. and patient was instructed, not to drive, operate heavy machinery, perform activities at heights, swimming or participation in water  activities or provide baby-sitting services while on Pain, Sleep and Anxiety Medications; until their outpatient Physician has advised to do so again. Also recommended to not to take more than prescribed Pain, Sleep and Anxiety Medications.   Disposition: Home Diet recommendation:  Discharge Diet Orders (From admission, onward)     Start     Ordered   08/29/23 0000  Diet general        08/29/23 1542           Regular diet DISCHARGE MEDICATION: Allergies as of 08/30/2023       Reactions   Effexor  [venlafaxine ]    agitation   Topamax [topiramate]    Tingling in arms        Medication List     PAUSE taking these medications    Semaglutide (1 MG/DOSE) 4 MG/3ML Sopn Wait to take this until your doctor or other care provider tells you to start again. Inject into the skin.       STOP taking these medications    amoxicillin  875 MG tablet Commonly known as: AMOXIL    celecoxib  100 MG capsule Commonly known as: CELEBREX    sulfamethoxazole-trimethoprim 800-160 MG tablet Commonly known as: BACTRIM DS       TAKE these medications    aspirin  EC 81 MG tablet Take 1 tablet (81 mg total) by mouth daily. Swallow whole.   B-12 250 MCG Tabs Take  250 mcg by mouth daily.   buPROPion  300 MG 24 hr tablet Commonly known as: WELLBUTRIN  XL   carvedilol  3.125 MG tablet Commonly known as: COREG  Take 3.125 mg by mouth 2 (two) times daily with a meal.   diclofenac  Sodium 1 % Gel Commonly known as: VOLTAREN  Apply 2 g topically 4 (four) times daily.   doxycycline  100 MG tablet Commonly known as: VIBRA -TABS Take 1 tablet (100 mg total) by mouth 2 (two) times daily for 4 days.   gabapentin  300 MG capsule Commonly known as: NEURONTIN  Take 300 mg by mouth 3 (three) times daily.   HYDROcodone -acetaminophen  7.5-325 MG tablet Commonly known as: NORCO Take 1 tablet by mouth every 6 (six) hours as needed for moderate pain (pain score 4-6) or severe pain (pain score 7-10).   levothyroxine  25 MCG tablet Commonly known as: SYNTHROID  Take 75 mcg by mouth.   liothyronine  5 MCG tablet Commonly known as: CYTOMEL  Take 5 mcg by mouth daily.   multivitamin capsule Take 1 capsule by mouth daily.   omeprazole 40 MG capsule Commonly known as: PRILOSEC Take 40 mg by mouth daily.   QUEtiapine  50 MG tablet Commonly known as: SEROQUEL  Take 50 mg by mouth.   rosuvastatin  10 MG tablet Commonly known as: CRESTOR  Take 10 mg by mouth daily.   saccharomyces boulardii 250 MG capsule Commonly known as: FLORASTOR Take 1 capsule (250 mg total) by mouth 2 (two) times  daily.   sodium bicarbonate  650 MG tablet Take 1 tablet (650 mg total) by mouth 2 (two) times daily for 3 days.   Vitamin D  (Ergocalciferol ) 1.25 MG (50000 UNIT) Caps capsule Commonly known as: DRISDOL  Take 1 capsule (50,000 Units total) by mouth every 7 (seven) days.               Discharge Care Instructions  (From admission, onward)           Start     Ordered   08/30/23 0000  Discharge wound care:       Comments: Change dressing as needed   08/30/23 1030   08/29/23 0000  Change dressing (specify)       Comments: Dressing changes as needed when saturated.    08/29/23 1542            Follow-up Information     Sparks, Rosalynn Come, MD Follow up.   Specialty: Internal Medicine Why: Hospital follow up Contact information: [address] [facility] [place] [address] [phone]                Discharge Exam:   Subjective: Drainage is improved, feels well and wants to go home.   Objective: Vitals:   08/30/23 0430 08/30/23 0757  BP: 125/81 120/68  Pulse: 99 88  Resp: 18 16  Temp: 97.8 F (36.6 C) 98.3 F (36.8 C)  SpO2: 96% 97%    Intake/Output Summary (Last 24 hours) at 08/30/2023 1030 Last data filed at 08/30/2023 0210 Gross per 24 hour  Intake 780 ml  Output --  Net 780 ml   Filed Weights   08/23/23 1605  Weight: 68 kg    Exam:  General:  Appears calm and comfortable and is in NAD, up in bedside chair Eyes:   EOMI, normal lids, iris ENT:  grossly normal hearing, lips & tongue, mmm Cardiovascular:  RRR. No LE edema.  Respiratory:   CTA bilaterally with no wheezes/rales/rhonchi.  Normal respiratory effort.  Abdomen:  soft, NT, ND Skin:  no rash or induration seen on limited exam Musculoskeletal:  grossly normal tone BUE/BLE, good ROM, no bony abnormality Psychiatric:  grossly normal mood and affect, speech fluent and appropriate, AOx3 Neurologic:  CN 2-12 grossly intact, moves all extremities in coordinated fashion  Data Reviewed: I have reviewed the patient's lab results since admission.  Pertinent labs for today include:  K+ 3.2 Glucose 113 Phos 2.0 AST 62/ALT 33 WBC 18.5, improved Hgb 9.5, stable Platelets 625     Condition at discharge: improving  The results of significant diagnostics from this hospitalization (including imaging, microbiology, ancillary and laboratory) are listed below for reference.   Imaging Studies: DG Chest Port 1 View Result Date: 08/29/2023 CLINICAL DATA:  Shortness of breath. EXAM: PORTABLE CHEST 1 VIEW COMPARISON:  chest x-ray 08/29/2023. Chest  CT 08/23/2023. FINDINGS: Small left pleural effusion and patchy opacities in the left lung base have not significantly changed. The right lung is grossly clear. The heart is enlarged aorta is tortuous, unchanged. There is no pneumothorax or acute fracture. IMPRESSION: Stable small left pleural effusion and patchy opacities in the left lung base. Electronically Signed   By: Tyron Gallon M.D.   On: 08/29/2023 23:27   DG Chest Port 1 View Result Date: 08/29/2023 CLINICAL DATA:  [id number] Encounter for chest tube removal [id number] EXAM: PORTABLE CHEST 1 VIEW COMPARISON:  Chest XR, 08/29/2023. IR fluoroscopy, 08/24/2023. CT chest, 08/23/2023 FINDINGS: Cardiac silhouette is within normal  limits. Aortic atherosclerosis. The RIGHT lung is clear and well inflated. Removal of LEFT pigtail thoracostomy tube without residual pleural effusion. No pneumothorax. No acute osseous abnormality. IMPRESSION: Removal LEFT pigtail thoracostomy tube without residual pleural effusion. No pneumothorax. Electronically Signed   By: Art Largo M.D.   On: 08/29/2023 14:20   DG Chest Port 1 View Result Date: 08/29/2023 CLINICAL DATA:  Chest tube removal. EXAM: PORTABLE CHEST 1 VIEW COMPARISON:  August 28, 2023. FINDINGS: Stable cardiomediastinal silhouette. Stable left-sided chest tube is noted without pneumothorax. Small left pleural effusion is noted with adjacent atelectasis or scarring. Minimal right basilar atelectasis is noted. IMPRESSION: Stable left-sided chest tube without pneumothorax. Stable small left pleural effusion with adjacent atelectasis or scarring. Electronically Signed   By: Rosalene Colon M.D.   On: 08/29/2023 09:15   CT HEAD WO CONTRAST ( ) Result Date: 08/28/2023 CLINICAL DATA:  73 year old female with altered mental status. Cough, shortness of breath. EXAM: CT HEAD WITHOUT CONTRAST TECHNIQUE: Contiguous axial images were obtained from the base of the skull through the vertex without intravenous contrast.  RADIATION DOSE REDUCTION: This exam was performed according to the departmental dose-optimization program which includes automated exposure control, adjustment of the mA and/or kV according to patient size and/or use of iterative reconstruction technique. COMPARISON:  Brain MRI 02/13/2022. FINDINGS: Brain: Chronic right lower cerebellopontine angle extra-axial mass which was homogeneously enhancing in 2023 is partially calcified on series 3, image 8 and grossly stable, about 10 mm diameter. No associated brainstem or cerebellar mass effect or edema. Underlying brain volume remains normal for age. No midline shift, ventriculomegaly, mass effect, intracranial hemorrhage or evidence of cortically based acute infarction. Chronic cerebral white matter disease with patchy bilateral hemispheric involvement is stable since 2023. However, a small the are of the left thalamus is new since that time (series 3, image 16). Right inferior lentiform perivascular space is stable. Vascular: No suspicious intracranial vascular hyperdensity. Mild Calcified atherosclerosis at the skull base. Skull: Intact. Osteopenia. No acute osseous abnormality identified. Sinuses/Orbits: Visualized paranasal sinuses and mastoids are clear. Other: No acute orbit or scalp soft tissue finding. IMPRESSION: 1. Age indeterminate lacunar infarct of the left thalamus, new from a 2023 MRI. Query acute right side deficits. 2. No other acute intracranial abnormality. Chronic white matter disease., and small chronic 1 cm probable Meningioma of the right lower cerebellopontine angle. Electronically Signed   By: Marlise Simpers M.D.   On: 08/28/2023 15:21   DG Chest Port 1 View Result Date: 08/28/2023 CLINICAL DATA:  Shortness of breath EXAM: PORTABLE CHEST 1 VIEW COMPARISON:  08/26/2023 FINDINGS: Cardiac shadow is stable. Left pigtail catheter is again seen. No pneumothorax is noted. Small left pleural effusion is again seen. Mild bibasilar atelectatic changes are  again noted. IMPRESSION: No significant interval change from the prior exam. Electronically Signed   By: Violeta Grey M.D.   On: 08/28/2023 11:17   DG Chest Port 1 View Result Date: 08/26/2023 CLINICAL DATA:  73 year old female with partially loculated left pleural effusion, chest tube. EXAM: PORTABLE CHEST 1 VIEW COMPARISON:  Portable chest yesterday and earlier. FINDINGS: Portable AP upright view at 0823 hours. Continued low lung volumes. Stable left pigtail chest tube at the lateral costophrenic angle. Stable cardiac size and mediastinal contours. Moderate hiatal hernia demonstrated recently by CT. Regression of left pleural effusion since 08/23/2023, and also decreased since yesterday. No pneumothorax or pulmonary edema. Otherwise stable lung base hypo ventilation. Stable visualized osseous structures. Stable visible bowel gas. IMPRESSION:  Decreasing left pleural effusion with chest tube in place. No pneumothorax. Stable lung base hypoventilation. Electronically Signed   By: Marlise Simpers M.D.   On: 08/26/2023 12:15   DG Chest Port 1 View Result Date: 08/25/2023 CLINICAL DATA:  [id number] Chest tube in place [id number] EXAM: PORTABLE CHEST - 1 VIEW COMPARISON:  08/25/2023 FINDINGS: Stable pigtail chest tube at the left lung base. No pneumothorax. Persistent left effusion. Patchy opacities at the lung bases left greater than right stable. Heart size and mediastinal contours are within normal limits. Visualized bones unremarkable. IMPRESSION: 1. Stable left chest tube. No pneumothorax. 2. Persistent left effusion and bibasilar opacities. Electronically Signed   By: Nicoletta Barrier M.D.   On: 08/25/2023 18:39   DG Chest Port 1 View Result Date: 08/25/2023 CLINICAL DATA:  Chest tube.  Follow-up. EXAM: PORTABLE CHEST 1 VIEW COMPARISON:  10/03/2014 radiography. Images from catheter placement yesterday. CT 08/23/2023. FINDINGS: Heart size is normal. Chronic hiatal hernia again shown. Aortic atherosclerosis and tortuosity. Mild  chronic markings in the right lung but no significant finding. Pleural catheter in place on the left at the inferior pleural space. Probable slight reduction in pleural density when compared to the CT scan. No visible pleural air. IMPRESSION: 1. Pleural catheter in place on the left at the inferior pleural space. Probable slight reduction in pleural density when compared to the CT scan. No visible pleural air. 2. Chronic hiatal hernia. Aortic atherosclerosis and tortuosity. Electronically Signed   By: Bettylou Brunner M.D.   On: 08/25/2023 11:42   IR PERC PLEURAL DRAIN W/INDWELL CATH W/IMG GUIDE Result Date: 08/24/2023 INDICATION: 73 year old presented with productive cough and shortness of breath. Chest CT demonstrated a loculated left pleural effusion. Request for left chest tube placement. EXAM: IMAGE GUIDED PLACEMENT OF LEFT CHEST TUBE MEDICATIONS: Moderate sedation ANESTHESIA/SEDATION: Moderate (conscious) sedation was employed during this procedure. A total of Versed  2 mg and Fentanyl  100 mcg was administered intravenously by the radiology nurse. Total intra-service moderate Sedation Time: 18 minutes. The patient's level of consciousness and vital signs were monitored continuously by radiology nursing throughout the procedure under my direct supervision. COMPLICATIONS: None immediate. FLUOROSCOPY TIME:  Radiation Exposure Index (as provided by the fluoroscopic device): 1.9 mGy Kerma PROCEDURE: Informed written consent was obtained from the patient after a thorough discussion of the procedural risks, benefits and alternatives. All questions were addressed. Maximal Sterile Barrier Technique was utilized including caps, mask, sterile gowns, sterile gloves, sterile drape, hand hygiene and skin antiseptic. A timeout was performed prior to the initiation of the procedure. Patient was placed supine on the interventional table. The left mid axillary region was prepped and draped in sterile fashion. Ultrasound  demonstrated loculated left pleural effusion. Left mid axillary region was anesthetized using 1% lidocaine . A small incision was made. Using ultrasound guidance, a Yueh catheter was directed into the loculated pleural fluid and straw-colored fluid was aspirated. Superstiff Amplatz wire was advanced into the pleural space and the tract was dilated to accommodate a 14 [place] multipurpose drain. Fluid was collected for labs. Chest tube was attached to a chest drainage system. Chest tube was sutured to the skin. Dressing was placed. Fluoroscopic and ultrasound images were taken and saved for documentation. FINDINGS: Ultrasound demonstrated a loculated left pleural effusion. Large fluid pocket along the posterior left mid axillary region was targeted. Straw-colored fluid was aspirated. Chest tube was in the lower portion of the left chest at the end of the procedure. IMPRESSION: Image guided placement of a left  chest tube. Electronically Signed   By: Elene Griffes M.D.   On: 08/24/2023 16:41   US  CHEST (PLEURAL EFFUSION) Result Date: 08/24/2023 CLINICAL DATA:  Evaluate left pleural effusion for loculations and possible chest tube placement. EXAM: CHEST ULTRASOUND COMPARISON:  Chest CT 08/23/2023 FINDINGS: Moderate sized left pleural effusion with scattered septations. There are some large pockets of fluid. IMPRESSION: Moderate sized loculated left pleural effusion. Electronically Signed   By: Elene Griffes M.D.   On: 08/24/2023 14:16   CT Angio Chest PE W and/or Wo Contrast Result Date: 08/23/2023 CLINICAL DATA:  Productive cough for 2 weeks with shortness of breath and chest pain, clinically suspected pulmonary embolism. EXAM: CT ANGIOGRAPHY CHEST WITH CONTRAST TECHNIQUE: Multidetector CT imaging of the chest was performed using the standard protocol during bolus administration of intravenous contrast. Multiplanar CT image reconstructions and MIPs were obtained to evaluate the vascular anatomy. RADIATION DOSE REDUCTION:  This exam was performed according to the departmental dose-optimization program which includes automated exposure control, adjustment of the mA and/or kV according to patient size and/or use of iterative reconstruction technique. CONTRAST:  75mL OMNIPAQUE  IOHEXOL  350 MG/ML SOLN COMPARISON:  The last chest film available for direct comparison is portable chest 10/03/2014. No prior chest CT. The most recent study, with only the report available is a rib series and PA chest from 03/21/2022 performed at [facility]. Limited comparison is available with CT abdomen and pelvis with contrast 10/26/2016. FINDINGS: Cardiovascular: The pulmonary arteries are normal in caliber without evidence of arterial emboli or right heart strain. Central pulmonary veins are borderline prominent. The heart has slightly enlarged since 2018, with a small pericardial effusion also new. There are trace single vessel calcific plaques proximal LAD coronary artery. The aorta and great vessels are tortuous but normal in caliber with mild patchy aortic and minimal great vessel calcifications. There is no aortic dissection or stenosis. Mediastinum/Nodes: There are slightly prominent prevascular and left hilar lymph nodes both up to 1.1 cm short axis, precarinal lymph nodes to 1.2 cm, subcarinal nodal complex 1.4 cm, few right hilar lymph nodes up to 1.1 cm. There is no bulky or encasing adenopathy. Thyroid  gland, axillary spaces are unremarkable. Thoracic esophagus is unremarkable. Moderate-to-large sized hiatal hernia is increased in prominence since 2018. Within the hernia there is an intrathoracic gastric remnant status post old gastric bypass with the gastrojejunostomy within the sac. Chronic asymmetric elevation again noted right hemidiaphragm. The trachea and main bronchi are clear. Lungs/Pleura: There is a moderate-sized left pleural effusion. Most of the effusion is either posterior or sub pulmonic. A portion of the fluid tracks laterally and  cephalad and a small portion of the fluid is loculated in the left apex. On the right there is a small layering pleural effusion without loculations. There is compressive atelectasis medial right lower lobe alongside the hiatal hernia. Posterior dependent atelectasis also noted. There is bronchial thickening in the lower lobes. In the left lower lobe there is scattered subsegmental bronchial impaction and there is consolidation or compressive collapse of the basal segments as well as of the dorsal lingula. On the right, there is a roughly rhomboid shaped well-circumscribed apical subpleural nodule containing an ectatic bronchiole and measuring 1 x 0.8 cm. This may represent a nodular scar but is indeterminate. Two other tiny nodules in the posterior segment of the right upper lobe, both measuring 3 mm, were noted on 6:41 and 6:58. The rest of the lungs are generally clear. Upper Abdomen: No acute abnormality. Abdominal aortic  atherosclerosis. Musculoskeletal: Reverse S shaped thoracolumbar scoliosis noted with osteopenia and thoracic spondylosis. Multilevel degenerative discs. No acute or other significant osseous findings. Unremarkable visualized chest wall. The Review of the MIP images confirms the above findings. IMPRESSION: 1. No evidence of arterial emboli or right heart strain. 2. Slight cardiomegaly with small pericardial effusion. Borderline prominent pulmonary veins. 3. Aortic and trace coronary artery atherosclerosis. 4. Moderate-sized left pleural effusion, mostly posterior or subpulmonic, with a small portion of the fluid loculated in the left apex. 5. Small right pleural effusion without loculations. Atelectasis right lower lobe. 6. Bronchial thickening in the lower lobes with scattered subsegmental bronchial impaction in the left lower lobe, and consolidation or compressive collapse of the left lower lobe basal segments and dorsal lingula. 7. 1 x 0.8 cm right apical subpleural nodule containing an  ectatic bronchiole. This may represent a nodular scar but is indeterminate. Per Fleischner Society Guidelines, consider a non-contrast Chest CT at 3 months, a PET/CT, or tissue sampling. These guidelines do not apply to immunocompromised patients and patients with cancer. Follow up in patients with significant comorbidities as clinically warranted. For lung cancer screening, adhere to Lung-RADS guidelines. Reference: Radiology. 2017; 284(1):228-43. 8. Two other tiny 3 mm nodules in the posterior segment of the right upper lobe. 9. Osteopenia, scoliosis and degenerative change. Aortic Atherosclerosis (ICD10-I70.0).  Or Electronically Signed   By: Denman Fischer M.D.   On: 08/23/2023 20:51    Microbiology: Results for orders placed or performed during the hospital encounter of 08/23/23  Blood culture (routine x 2)     Status: None   Collection Time: 08/23/23  5:45 PM   Specimen: BLOOD  Result Value Ref Range Status   Specimen Description BLOOD LEFT ANTECUBITAL  Final   Special Requests   Final    BOTTLES DRAWN AEROBIC AND ANAEROBIC Blood Culture adequate volume   Culture   Final    NO GROWTH 5 DAYS Performed at [facility], [address] [place], [place] [address]    Report Status 08/28/2023 FINAL  Final  Blood culture (routine x 2)     Status: None   Collection Time: 08/23/23  5:45 PM   Specimen: BLOOD  Result Value Ref Range Status   Specimen Description BLOOD BLOOD RIGHT WRIST  Final   Special Requests   Final    BOTTLES DRAWN AEROBIC AND ANAEROBIC Blood Culture results may not be optimal due to an inadequate volume of blood received in culture bottles   Culture   Final    NO GROWTH 5 DAYS Performed at [facility], [address] [place], [place] [address]    Report Status 08/28/2023 FINAL  Final  Resp panel by RT-PCR (RSV, Flu A&B, Covid) Anterior Nasal Swab     Status: None   Collection Time: 08/23/23  8:45 PM   Specimen: Anterior Nasal Swab  Result Value  Ref Range Status   SARS Coronavirus 2 by RT PCR NEGATIVE NEGATIVE Final    Comment: (NOTE) SARS-CoV-2 target nucleic acids are NOT DETECTED.  The SARS-CoV-2 RNA is generally detectable in upper respiratory specimens during the acute phase of infection. The lowest concentration of SARS-CoV-2 viral copies this assay can detect is 138 copies/mL. A negative result does not preclude SARS-Cov-2 infection and should not be used as the sole basis for treatment or other patient management decisions. A negative result may occur with  improper specimen collection/handling, submission of specimen other than nasopharyngeal swab, presence of viral mutation(s) within the  areas targeted by this assay, and inadequate number of viral copies(<138 copies/mL). A negative result must be combined with clinical observations, patient history, and epidemiological information. The expected result is Negative.  Fact Sheet for Patients:  BloggerCourse.com  Fact Sheet for Healthcare Providers:  SeriousBroker.it  This test is no t yet approved or cleared by the [place]  FDA and  has been authorized for detection and/or diagnosis of SARS-CoV-2 by FDA under an Emergency Use Authorization (EUA). This EUA will remain  in effect (meaning this test can be used) for the duration of the COVID-19 declaration under Section 564(b)(1) of the Act, 21 U.S.C.section 360bbb-3(b)(1), unless the authorization is terminated  or revoked sooner.       Influenza A by PCR NEGATIVE NEGATIVE Final   Influenza B by PCR NEGATIVE NEGATIVE Final    Comment: (NOTE) The Xpert Xpress SARS-CoV-2/FLU/RSV plus assay is intended as an aid in the diagnosis of influenza from Nasopharyngeal swab specimens and should not be used as a sole basis for treatment. Nasal washings and aspirates are unacceptable for Xpert Xpress SARS-CoV-2/FLU/RSV testing.  Fact Sheet for  Patients: BloggerCourse.com  Fact Sheet for Healthcare Providers: SeriousBroker.it  This test is not yet approved or cleared by the [place]  FDA and has been authorized for detection and/or diagnosis of SARS-CoV-2 by FDA under an Emergency Use Authorization (EUA). This EUA will remain in effect (meaning this test can be used) for the duration of the COVID-19 declaration under Section 564(b)(1) of the Act, 21 U.S.C. section 360bbb-3(b)(1), unless the authorization is terminated or revoked.     Resp Syncytial Virus by PCR NEGATIVE NEGATIVE Final    Comment: (NOTE) Fact Sheet for Patients: BloggerCourse.com  Fact Sheet for Healthcare Providers: SeriousBroker.it  This test is not yet approved or cleared by the [place]  FDA and has been authorized for detection and/or diagnosis of SARS-CoV-2 by FDA under an Emergency Use Authorization (EUA). This EUA will remain in effect (meaning this test can be used) for the duration of the COVID-19 declaration under Section 564(b)(1) of the Act, 21 U.S.C. section 360bbb-3(b)(1), unless the authorization is terminated or revoked.  Performed at [facility], [address] [place], [place] [address]   Body fluid culture w Gram Stain     Status: None   Collection Time: 08/24/23  9:41 AM   Specimen: Pleura; Body Fluid  Result Value Ref Range Status   Specimen Description   Final    PLEURAL Performed at [facility], [address] [place], [place] [address]    Special Requests   Final    NONE Performed at [facility], [address] [place], [place] [address]    Gram Stain   Final    RARE WBC PRESENT, PREDOMINANTLY PMN NO ORGANISMS SEEN    Culture   Final    NO GROWTH 3 DAYS Performed at [facility], [address] [place], [place] [address]    Report Status 08/28/2023 FINAL  Final   MRSA Next Gen by PCR, Nasal     Status: None   Collection Time: 08/25/23  3:05 PM   Specimen: Nasal Mucosa; Nasal Swab  Result Value Ref Range Status   MRSA by PCR Next Gen NOT DETECTED NOT DETECTED Final    Comment: (NOTE) The GeneXpert MRSA Assay (FDA approved for NASAL specimens only), is one component of a comprehensive MRSA colonization surveillance program. It is not intended to diagnose MRSA infection nor to guide or monitor  treatment for MRSA infections. Test performance is not FDA approved in patients less than 35 years old. Performed at [facility], [address] [place], [place] [address]     Labs: CBC: Recent Labs  Lab 08/26/23 1456 08/27/23 [phone] 08/28/23 0556 08/29/23 0613 08/30/23 0611  WBC 33.8* 24.0* 23.8* 19.3* 18.5*  NEUTROABS 29.8* 20.3* 19.5* 14.6* 13.4*  HGB 11.7* 10.4* 9.7* 9.4* 9.5*  HCT 34.0* 30.4* 28.5* 27.6* 27.7*  MCV 89.0 90.2 90.2 89.9 87.4  PLT 615* 556* 591* 570* 625*   Basic Metabolic Panel: Recent Labs  Lab 08/26/23 0424 08/26/23 1456 08/27/23 0600 08/27/23 0842 08/28/23 0556 08/29/23 0613 08/30/23 0611  NA 130* 127*  --  132* 131* 132* 135  K 3.5 3.8  --  3.3* 3.8 3.0* 3.2*  CL 101 102  --  107 106 107 107  CO2 18* 18*  --  17* 18* 18* 20*  GLUCOSE 101* 132*  --  93 90 82 113*  BUN 18 21  --  20 19 14 11   CREATININE 1.01* 1.14*  --  0.98 0.73 0.73 0.75  CALCIUM  8.1* 8.2*  --  7.8* 7.9* 7.7* 8.0*  MG 2.2  --  2.2  --  2.1 2.1 2.0  PHOS 4.7*  --  3.5  --  2.5 2.2* 2.0*   Liver Function Tests: Recent Labs  Lab 08/26/23 1456 08/27/23 0842 08/28/23 0556 08/29/23 0613 08/30/23 0611  AST 18 21 18  43* 62*  ALT 14 14 13 21  33  ALKPHOS 83 76 66 71 67  BILITOT 0.6 0.6 0.7 0.7 0.3  PROT 5.8* 5.2* 5.1* 5.1* 5.4*  ALBUMIN 2.2* 1.9* 1.9* 2.0* 2.2*   CBG: No results for input(s): "GLUCAP" in the last 168 hours.  Discharge time spent: less than 30 minutes.  Signed: Lorita Rosa, MD [facility] 08/30/2023

## 2023-08-30 NOTE — TOC Transition Note (Signed)
 Transition of Care [facility]) - Discharge Note   Patient Details  Name: Barbara Mclaughlin MRN: [id number] Date of Birth: 11-Apr-1951  [facility] CM/SW Contact:  Crayton Docker, RN Phone Number: 08/30/2023, 12:14 PM   Clinical Narrative:     Discharge order noted, discharge summary noted. Patient discharge to home/self care.   Final next level of care: Home/Self Care Barriers to Discharge: No Barriers Identified   Patient Goals and CMS Choice    Home/self care  Discharge Placement               Home/self care    Discharge Plan and Services Additional resources added to the After Visit Summary for      Home/self care        Social Drivers of Health (SDOH) Interventions SDOH Screenings   Food Insecurity: No Food Insecurity (08/25/2023)  Housing: Low Risk  (08/25/2023)  Transportation Needs: No Transportation Needs (08/25/2023)  Utilities: Not At Risk (08/25/2023)  Depression (PHQ2-9): Medium Risk (01/29/2020)  Financial Resource Strain: Low Risk  (03/12/2023)   Received from [facility]  Social Connections: Socially Isolated (08/25/2023)  Tobacco Use: Low Risk  (08/28/2023)     Readmission Risk Interventions     No data to display

## 2023-08-30 NOTE — Plan of Care (Signed)

## 2023-09-03 DIAGNOSIS — J90 Pleural effusion, not elsewhere classified: Secondary | ICD-10-CM | POA: Diagnosis not present

## 2023-09-03 DIAGNOSIS — D649 Anemia, unspecified: Secondary | ICD-10-CM | POA: Diagnosis not present

## 2023-09-03 DIAGNOSIS — G4733 Obstructive sleep apnea (adult) (pediatric): Secondary | ICD-10-CM | POA: Diagnosis not present

## 2023-09-03 DIAGNOSIS — J869 Pyothorax without fistula: Secondary | ICD-10-CM | POA: Diagnosis not present

## 2023-09-03 DIAGNOSIS — Z09 Encounter for follow-up examination after completed treatment for conditions other than malignant neoplasm: Secondary | ICD-10-CM | POA: Diagnosis not present

## 2023-09-03 DIAGNOSIS — R768 Other specified abnormal immunological findings in serum: Secondary | ICD-10-CM | POA: Diagnosis not present

## 2023-09-03 DIAGNOSIS — R350 Frequency of micturition: Secondary | ICD-10-CM | POA: Diagnosis not present

## 2023-09-03 DIAGNOSIS — R634 Abnormal weight loss: Secondary | ICD-10-CM | POA: Diagnosis not present

## 2023-09-05 DIAGNOSIS — J869 Pyothorax without fistula: Secondary | ICD-10-CM | POA: Diagnosis not present

## 2023-09-05 DIAGNOSIS — J189 Pneumonia, unspecified organism: Secondary | ICD-10-CM | POA: Diagnosis not present

## 2023-09-13 DIAGNOSIS — M2550 Pain in unspecified joint: Secondary | ICD-10-CM | POA: Diagnosis not present

## 2023-09-13 DIAGNOSIS — J90 Pleural effusion, not elsewhere classified: Secondary | ICD-10-CM | POA: Diagnosis not present

## 2023-09-13 DIAGNOSIS — R768 Other specified abnormal immunological findings in serum: Secondary | ICD-10-CM | POA: Diagnosis not present

## 2023-09-13 DIAGNOSIS — R918 Other nonspecific abnormal finding of lung field: Secondary | ICD-10-CM | POA: Diagnosis not present

## 2023-10-08 ENCOUNTER — Other Ambulatory Visit: Payer: Self-pay | Admitting: Gastroenterology

## 2023-10-08 DIAGNOSIS — R748 Abnormal levels of other serum enzymes: Secondary | ICD-10-CM

## 2023-10-08 DIAGNOSIS — R1319 Other dysphagia: Secondary | ICD-10-CM | POA: Diagnosis not present

## 2023-10-08 DIAGNOSIS — K862 Cyst of pancreas: Secondary | ICD-10-CM | POA: Diagnosis not present

## 2023-10-08 DIAGNOSIS — K219 Gastro-esophageal reflux disease without esophagitis: Secondary | ICD-10-CM | POA: Diagnosis not present

## 2023-10-09 DIAGNOSIS — E782 Mixed hyperlipidemia: Secondary | ICD-10-CM | POA: Diagnosis not present

## 2023-10-09 DIAGNOSIS — R748 Abnormal levels of other serum enzymes: Secondary | ICD-10-CM | POA: Diagnosis not present

## 2023-10-09 DIAGNOSIS — R5382 Chronic fatigue, unspecified: Secondary | ICD-10-CM | POA: Diagnosis not present

## 2023-10-09 DIAGNOSIS — E538 Deficiency of other specified B group vitamins: Secondary | ICD-10-CM | POA: Diagnosis not present

## 2023-10-09 DIAGNOSIS — Z0000 Encounter for general adult medical examination without abnormal findings: Secondary | ICD-10-CM | POA: Diagnosis not present

## 2023-10-09 DIAGNOSIS — E559 Vitamin D deficiency, unspecified: Secondary | ICD-10-CM | POA: Diagnosis not present

## 2023-10-09 DIAGNOSIS — E042 Nontoxic multinodular goiter: Secondary | ICD-10-CM | POA: Diagnosis not present

## 2023-10-09 DIAGNOSIS — I10 Essential (primary) hypertension: Secondary | ICD-10-CM | POA: Diagnosis not present

## 2023-10-09 DIAGNOSIS — E890 Postprocedural hypothyroidism: Secondary | ICD-10-CM | POA: Diagnosis not present

## 2023-10-09 DIAGNOSIS — Z1331 Encounter for screening for depression: Secondary | ICD-10-CM | POA: Diagnosis not present

## 2023-10-09 DIAGNOSIS — Z1211 Encounter for screening for malignant neoplasm of colon: Secondary | ICD-10-CM | POA: Diagnosis not present

## 2023-10-09 DIAGNOSIS — F321 Major depressive disorder, single episode, moderate: Secondary | ICD-10-CM | POA: Diagnosis not present

## 2023-10-09 DIAGNOSIS — K862 Cyst of pancreas: Secondary | ICD-10-CM | POA: Diagnosis not present

## 2023-10-09 DIAGNOSIS — J90 Pleural effusion, not elsewhere classified: Secondary | ICD-10-CM | POA: Diagnosis not present

## 2023-10-09 DIAGNOSIS — M810 Age-related osteoporosis without current pathological fracture: Secondary | ICD-10-CM | POA: Diagnosis not present

## 2023-10-09 DIAGNOSIS — Z79899 Other long term (current) drug therapy: Secondary | ICD-10-CM | POA: Diagnosis not present

## 2023-10-10 DIAGNOSIS — I10 Essential (primary) hypertension: Secondary | ICD-10-CM | POA: Diagnosis not present

## 2023-10-10 DIAGNOSIS — R8290 Unspecified abnormal findings in urine: Secondary | ICD-10-CM | POA: Diagnosis not present

## 2023-10-10 DIAGNOSIS — I471 Supraventricular tachycardia, unspecified: Secondary | ICD-10-CM | POA: Diagnosis not present

## 2023-10-12 ENCOUNTER — Ambulatory Visit
Admission: RE | Admit: 2023-10-12 | Discharge: 2023-10-12 | Disposition: A | Discharge status: Home / Self Care | Source: Ambulatory Visit | Attending: Gastroenterology | Admitting: Gastroenterology

## 2023-10-12 ENCOUNTER — Other Ambulatory Visit: Payer: Self-pay | Admitting: Gastroenterology

## 2023-10-12 DIAGNOSIS — K449 Diaphragmatic hernia without obstruction or gangrene: Secondary | ICD-10-CM | POA: Diagnosis not present

## 2023-10-12 DIAGNOSIS — R748 Abnormal levels of other serum enzymes: Secondary | ICD-10-CM | POA: Diagnosis present

## 2023-10-12 DIAGNOSIS — K862 Cyst of pancreas: Secondary | ICD-10-CM | POA: Diagnosis present

## 2023-10-12 DIAGNOSIS — K8689 Other specified diseases of pancreas: Secondary | ICD-10-CM | POA: Diagnosis not present

## 2023-10-12 MED ORDER — GADOBUTROL 1 MMOL/ML IV SOLN
6.0000 mL | Freq: Once | INTRAVENOUS | Status: AC | PRN
  Administered 2023-10-12: 6 mL via INTRAVENOUS

## 2023-10-18 DIAGNOSIS — Z1211 Encounter for screening for malignant neoplasm of colon: Secondary | ICD-10-CM | POA: Diagnosis not present

## 2023-10-23 DIAGNOSIS — F5105 Insomnia due to other mental disorder: Secondary | ICD-10-CM | POA: Diagnosis not present

## 2023-10-23 DIAGNOSIS — F331 Major depressive disorder, recurrent, moderate: Secondary | ICD-10-CM | POA: Diagnosis not present

## 2023-10-24 DIAGNOSIS — Z79899 Other long term (current) drug therapy: Secondary | ICD-10-CM | POA: Diagnosis not present

## 2023-10-24 DIAGNOSIS — M0579 Rheumatoid arthritis with rheumatoid factor of multiple sites without organ or systems involvement: Secondary | ICD-10-CM | POA: Diagnosis not present

## 2023-10-26 DIAGNOSIS — M5416 Radiculopathy, lumbar region: Secondary | ICD-10-CM | POA: Diagnosis not present

## 2023-10-26 DIAGNOSIS — M48062 Spinal stenosis, lumbar region with neurogenic claudication: Secondary | ICD-10-CM | POA: Diagnosis not present

## 2023-10-26 DIAGNOSIS — Z79899 Other long term (current) drug therapy: Secondary | ICD-10-CM | POA: Diagnosis not present

## 2023-11-06 DIAGNOSIS — E890 Postprocedural hypothyroidism: Secondary | ICD-10-CM | POA: Diagnosis not present

## 2023-11-06 DIAGNOSIS — G4733 Obstructive sleep apnea (adult) (pediatric): Secondary | ICD-10-CM | POA: Diagnosis not present

## 2023-11-06 DIAGNOSIS — I10 Essential (primary) hypertension: Secondary | ICD-10-CM | POA: Diagnosis not present

## 2023-11-06 DIAGNOSIS — D490 Neoplasm of unspecified behavior of digestive system: Secondary | ICD-10-CM | POA: Diagnosis not present

## 2023-11-12 DIAGNOSIS — E890 Postprocedural hypothyroidism: Secondary | ICD-10-CM | POA: Diagnosis not present

## 2023-11-12 DIAGNOSIS — Z79899 Other long term (current) drug therapy: Secondary | ICD-10-CM | POA: Diagnosis not present

## 2023-11-12 DIAGNOSIS — I10 Essential (primary) hypertension: Secondary | ICD-10-CM | POA: Diagnosis not present

## 2023-11-12 DIAGNOSIS — G4733 Obstructive sleep apnea (adult) (pediatric): Secondary | ICD-10-CM | POA: Diagnosis not present

## 2023-11-12 DIAGNOSIS — E669 Obesity, unspecified: Secondary | ICD-10-CM | POA: Diagnosis not present

## 2023-11-12 DIAGNOSIS — K219 Gastro-esophageal reflux disease without esophagitis: Secondary | ICD-10-CM | POA: Diagnosis not present

## 2023-11-12 DIAGNOSIS — Z6824 Body mass index (BMI) 24.0-24.9, adult: Secondary | ICD-10-CM | POA: Diagnosis not present

## 2023-11-12 DIAGNOSIS — Z9884 Bariatric surgery status: Secondary | ICD-10-CM | POA: Diagnosis not present

## 2023-11-12 DIAGNOSIS — K862 Cyst of pancreas: Secondary | ICD-10-CM | POA: Diagnosis not present

## 2023-11-12 DIAGNOSIS — Z980 Intestinal bypass and anastomosis status: Secondary | ICD-10-CM | POA: Diagnosis not present

## 2023-11-13 DIAGNOSIS — D490 Neoplasm of unspecified behavior of digestive system: Secondary | ICD-10-CM | POA: Diagnosis not present

## 2023-11-13 DIAGNOSIS — I10 Essential (primary) hypertension: Secondary | ICD-10-CM | POA: Diagnosis not present

## 2023-11-13 DIAGNOSIS — E039 Hypothyroidism, unspecified: Secondary | ICD-10-CM | POA: Diagnosis not present

## 2023-11-13 DIAGNOSIS — G4733 Obstructive sleep apnea (adult) (pediatric): Secondary | ICD-10-CM | POA: Diagnosis not present

## 2023-11-13 DIAGNOSIS — I471 Supraventricular tachycardia, unspecified: Secondary | ICD-10-CM | POA: Diagnosis not present

## 2023-11-13 DIAGNOSIS — Z01810 Encounter for preprocedural cardiovascular examination: Secondary | ICD-10-CM | POA: Diagnosis not present

## 2023-11-14 DIAGNOSIS — X32XXXA Exposure to sunlight, initial encounter: Secondary | ICD-10-CM | POA: Diagnosis not present

## 2023-11-14 DIAGNOSIS — D2261 Melanocytic nevi of right upper limb, including shoulder: Secondary | ICD-10-CM | POA: Diagnosis not present

## 2023-11-14 DIAGNOSIS — D2271 Melanocytic nevi of right lower limb, including hip: Secondary | ICD-10-CM | POA: Diagnosis not present

## 2023-11-14 DIAGNOSIS — D485 Neoplasm of uncertain behavior of skin: Secondary | ICD-10-CM | POA: Diagnosis not present

## 2023-11-14 DIAGNOSIS — L570 Actinic keratosis: Secondary | ICD-10-CM | POA: Diagnosis not present

## 2023-11-14 DIAGNOSIS — D2262 Melanocytic nevi of left upper limb, including shoulder: Secondary | ICD-10-CM | POA: Diagnosis not present

## 2023-11-14 DIAGNOSIS — C44319 Basal cell carcinoma of skin of other parts of face: Secondary | ICD-10-CM | POA: Diagnosis not present

## 2023-11-14 DIAGNOSIS — D225 Melanocytic nevi of trunk: Secondary | ICD-10-CM | POA: Diagnosis not present

## 2023-11-14 DIAGNOSIS — D0472 Carcinoma in situ of skin of left lower limb, including hip: Secondary | ICD-10-CM | POA: Diagnosis not present

## 2023-11-14 DIAGNOSIS — Z85828 Personal history of other malignant neoplasm of skin: Secondary | ICD-10-CM | POA: Diagnosis not present

## 2023-11-16 DIAGNOSIS — I10 Essential (primary) hypertension: Secondary | ICD-10-CM | POA: Diagnosis not present

## 2023-11-16 DIAGNOSIS — E782 Mixed hyperlipidemia: Secondary | ICD-10-CM | POA: Diagnosis not present

## 2023-11-16 DIAGNOSIS — I071 Rheumatic tricuspid insufficiency: Secondary | ICD-10-CM | POA: Diagnosis not present

## 2023-11-16 DIAGNOSIS — I471 Supraventricular tachycardia, unspecified: Secondary | ICD-10-CM | POA: Diagnosis not present

## 2023-11-20 DIAGNOSIS — I471 Supraventricular tachycardia, unspecified: Secondary | ICD-10-CM | POA: Diagnosis not present

## 2023-11-20 DIAGNOSIS — Z7982 Long term (current) use of aspirin: Secondary | ICD-10-CM | POA: Diagnosis not present

## 2023-11-20 DIAGNOSIS — E039 Hypothyroidism, unspecified: Secondary | ICD-10-CM | POA: Diagnosis not present

## 2023-11-20 DIAGNOSIS — I10 Essential (primary) hypertension: Secondary | ICD-10-CM | POA: Diagnosis not present

## 2023-11-20 DIAGNOSIS — F419 Anxiety disorder, unspecified: Secondary | ICD-10-CM | POA: Diagnosis not present

## 2023-11-20 DIAGNOSIS — M1990 Unspecified osteoarthritis, unspecified site: Secondary | ICD-10-CM | POA: Diagnosis not present

## 2023-11-20 DIAGNOSIS — D649 Anemia, unspecified: Secondary | ICD-10-CM | POA: Diagnosis not present

## 2023-11-20 DIAGNOSIS — Z79890 Hormone replacement therapy (postmenopausal): Secondary | ICD-10-CM | POA: Diagnosis not present

## 2023-11-20 DIAGNOSIS — R9431 Abnormal electrocardiogram [ECG] [EKG]: Secondary | ICD-10-CM | POA: Diagnosis not present

## 2023-11-20 DIAGNOSIS — Z79899 Other long term (current) drug therapy: Secondary | ICD-10-CM | POA: Diagnosis not present

## 2023-11-20 DIAGNOSIS — F32A Depression, unspecified: Secondary | ICD-10-CM | POA: Diagnosis not present

## 2023-11-20 DIAGNOSIS — I4719 Other supraventricular tachycardia: Secondary | ICD-10-CM | POA: Diagnosis not present

## 2023-11-20 DIAGNOSIS — G8929 Other chronic pain: Secondary | ICD-10-CM | POA: Diagnosis not present

## 2023-11-20 DIAGNOSIS — G4733 Obstructive sleep apnea (adult) (pediatric): Secondary | ICD-10-CM | POA: Diagnosis not present

## 2023-11-20 DIAGNOSIS — M542 Cervicalgia: Secondary | ICD-10-CM | POA: Diagnosis not present

## 2023-11-20 DIAGNOSIS — R001 Bradycardia, unspecified: Secondary | ICD-10-CM | POA: Diagnosis not present

## 2023-11-20 DIAGNOSIS — M549 Dorsalgia, unspecified: Secondary | ICD-10-CM | POA: Diagnosis not present

## 2023-11-20 DIAGNOSIS — M069 Rheumatoid arthritis, unspecified: Secondary | ICD-10-CM | POA: Diagnosis not present

## 2023-11-21 DIAGNOSIS — I471 Supraventricular tachycardia, unspecified: Secondary | ICD-10-CM | POA: Diagnosis not present

## 2023-11-21 DIAGNOSIS — I517 Cardiomegaly: Secondary | ICD-10-CM | POA: Diagnosis not present

## 2023-11-23 DIAGNOSIS — G4733 Obstructive sleep apnea (adult) (pediatric): Secondary | ICD-10-CM | POA: Diagnosis not present

## 2023-12-04 DIAGNOSIS — G4733 Obstructive sleep apnea (adult) (pediatric): Secondary | ICD-10-CM | POA: Diagnosis not present

## 2023-12-04 DIAGNOSIS — E538 Deficiency of other specified B group vitamins: Secondary | ICD-10-CM | POA: Diagnosis not present

## 2023-12-04 DIAGNOSIS — J849 Interstitial pulmonary disease, unspecified: Secondary | ICD-10-CM | POA: Diagnosis not present

## 2023-12-04 DIAGNOSIS — J869 Pyothorax without fistula: Secondary | ICD-10-CM | POA: Diagnosis not present

## 2023-12-07 DIAGNOSIS — M5430 Sciatica, unspecified side: Secondary | ICD-10-CM | POA: Diagnosis not present

## 2023-12-07 DIAGNOSIS — E890 Postprocedural hypothyroidism: Secondary | ICD-10-CM | POA: Diagnosis not present

## 2023-12-07 DIAGNOSIS — E559 Vitamin D deficiency, unspecified: Secondary | ICD-10-CM | POA: Diagnosis not present

## 2023-12-07 DIAGNOSIS — M5489 Other dorsalgia: Secondary | ICD-10-CM | POA: Diagnosis not present

## 2023-12-07 DIAGNOSIS — I10 Essential (primary) hypertension: Secondary | ICD-10-CM | POA: Diagnosis not present

## 2023-12-07 DIAGNOSIS — E782 Mixed hyperlipidemia: Secondary | ICD-10-CM | POA: Diagnosis not present

## 2023-12-10 DIAGNOSIS — K5900 Constipation, unspecified: Secondary | ICD-10-CM | POA: Diagnosis not present

## 2023-12-10 DIAGNOSIS — E663 Overweight: Secondary | ICD-10-CM | POA: Diagnosis not present

## 2023-12-10 DIAGNOSIS — Z9884 Bariatric surgery status: Secondary | ICD-10-CM | POA: Diagnosis not present

## 2023-12-10 DIAGNOSIS — Z6825 Body mass index (BMI) 25.0-25.9, adult: Secondary | ICD-10-CM | POA: Diagnosis not present

## 2023-12-10 DIAGNOSIS — I10 Essential (primary) hypertension: Secondary | ICD-10-CM | POA: Diagnosis not present

## 2023-12-10 DIAGNOSIS — E890 Postprocedural hypothyroidism: Secondary | ICD-10-CM | POA: Diagnosis not present

## 2023-12-14 ENCOUNTER — Ambulatory Visit: Admit: 2023-12-14

## 2023-12-14 SURGERY — EGD (ESOPHAGOGASTRODUODENOSCOPY)
Anesthesia: General

## 2023-12-18 DIAGNOSIS — R2689 Other abnormalities of gait and mobility: Secondary | ICD-10-CM | POA: Diagnosis not present

## 2023-12-18 DIAGNOSIS — G8929 Other chronic pain: Secondary | ICD-10-CM | POA: Diagnosis not present

## 2023-12-18 DIAGNOSIS — M5442 Lumbago with sciatica, left side: Secondary | ICD-10-CM | POA: Diagnosis not present

## 2023-12-18 DIAGNOSIS — M5441 Lumbago with sciatica, right side: Secondary | ICD-10-CM | POA: Diagnosis not present

## 2023-12-24 DIAGNOSIS — K219 Gastro-esophageal reflux disease without esophagitis: Secondary | ICD-10-CM | POA: Diagnosis not present

## 2023-12-24 DIAGNOSIS — Z79899 Other long term (current) drug therapy: Secondary | ICD-10-CM | POA: Diagnosis not present

## 2023-12-24 DIAGNOSIS — K862 Cyst of pancreas: Secondary | ICD-10-CM | POA: Diagnosis not present

## 2023-12-24 DIAGNOSIS — M0579 Rheumatoid arthritis with rheumatoid factor of multiple sites without organ or systems involvement: Secondary | ICD-10-CM | POA: Diagnosis not present

## 2023-12-24 DIAGNOSIS — R748 Abnormal levels of other serum enzymes: Secondary | ICD-10-CM | POA: Diagnosis not present

## 2023-12-31 ENCOUNTER — Ambulatory Visit: Payer: PPO | Admitting: Oncology

## 2023-12-31 ENCOUNTER — Other Ambulatory Visit: Payer: PPO

## 2024-01-04 DIAGNOSIS — R2681 Unsteadiness on feet: Secondary | ICD-10-CM | POA: Diagnosis not present

## 2024-01-04 DIAGNOSIS — E538 Deficiency of other specified B group vitamins: Secondary | ICD-10-CM | POA: Diagnosis not present

## 2024-01-04 DIAGNOSIS — M6281 Muscle weakness (generalized): Secondary | ICD-10-CM | POA: Diagnosis not present

## 2024-01-09 DIAGNOSIS — C44319 Basal cell carcinoma of skin of other parts of face: Secondary | ICD-10-CM | POA: Diagnosis not present

## 2024-01-11 DIAGNOSIS — R2681 Unsteadiness on feet: Secondary | ICD-10-CM | POA: Diagnosis not present

## 2024-01-11 DIAGNOSIS — M6281 Muscle weakness (generalized): Secondary | ICD-10-CM | POA: Diagnosis not present

## 2024-01-16 DIAGNOSIS — M6281 Muscle weakness (generalized): Secondary | ICD-10-CM | POA: Diagnosis not present

## 2024-01-16 DIAGNOSIS — R2681 Unsteadiness on feet: Secondary | ICD-10-CM | POA: Diagnosis not present

## 2024-01-16 DIAGNOSIS — R008 Other abnormalities of heart beat: Secondary | ICD-10-CM | POA: Diagnosis not present

## 2024-01-18 DIAGNOSIS — F321 Major depressive disorder, single episode, moderate: Secondary | ICD-10-CM | POA: Diagnosis not present

## 2024-01-18 DIAGNOSIS — R5382 Chronic fatigue, unspecified: Secondary | ICD-10-CM | POA: Diagnosis not present

## 2024-01-18 DIAGNOSIS — E782 Mixed hyperlipidemia: Secondary | ICD-10-CM | POA: Diagnosis not present

## 2024-01-18 DIAGNOSIS — D0472 Carcinoma in situ of skin of left lower limb, including hip: Secondary | ICD-10-CM | POA: Diagnosis not present

## 2024-01-18 DIAGNOSIS — I10 Essential (primary) hypertension: Secondary | ICD-10-CM | POA: Diagnosis not present

## 2024-01-18 DIAGNOSIS — E039 Hypothyroidism, unspecified: Secondary | ICD-10-CM | POA: Diagnosis not present

## 2024-01-18 DIAGNOSIS — Z79899 Other long term (current) drug therapy: Secondary | ICD-10-CM | POA: Diagnosis not present

## 2024-01-18 DIAGNOSIS — E559 Vitamin D deficiency, unspecified: Secondary | ICD-10-CM | POA: Diagnosis not present

## 2024-01-25 DIAGNOSIS — Z79899 Other long term (current) drug therapy: Secondary | ICD-10-CM | POA: Diagnosis not present

## 2024-01-25 DIAGNOSIS — M6281 Muscle weakness (generalized): Secondary | ICD-10-CM | POA: Diagnosis not present

## 2024-01-25 DIAGNOSIS — R2681 Unsteadiness on feet: Secondary | ICD-10-CM | POA: Diagnosis not present

## 2024-01-25 DIAGNOSIS — M48062 Spinal stenosis, lumbar region with neurogenic claudication: Secondary | ICD-10-CM | POA: Diagnosis not present

## 2024-01-25 DIAGNOSIS — M5416 Radiculopathy, lumbar region: Secondary | ICD-10-CM | POA: Diagnosis not present

## 2024-02-08 DIAGNOSIS — M6281 Muscle weakness (generalized): Secondary | ICD-10-CM | POA: Diagnosis not present

## 2024-02-08 DIAGNOSIS — R2681 Unsteadiness on feet: Secondary | ICD-10-CM | POA: Diagnosis not present

## 2024-02-20 DIAGNOSIS — F5105 Insomnia due to other mental disorder: Secondary | ICD-10-CM | POA: Diagnosis not present

## 2024-02-20 DIAGNOSIS — F331 Major depressive disorder, recurrent, moderate: Secondary | ICD-10-CM | POA: Diagnosis not present

## 2024-02-21 DIAGNOSIS — R8290 Unspecified abnormal findings in urine: Secondary | ICD-10-CM | POA: Diagnosis not present

## 2024-02-21 DIAGNOSIS — Z79899 Other long term (current) drug therapy: Secondary | ICD-10-CM | POA: Diagnosis not present

## 2024-02-21 DIAGNOSIS — E538 Deficiency of other specified B group vitamins: Secondary | ICD-10-CM | POA: Diagnosis not present

## 2024-03-25 DIAGNOSIS — E538 Deficiency of other specified B group vitamins: Secondary | ICD-10-CM | POA: Diagnosis not present

## 2024-03-25 DIAGNOSIS — Z23 Encounter for immunization: Secondary | ICD-10-CM | POA: Diagnosis not present

## 2024-03-25 DIAGNOSIS — Z79899 Other long term (current) drug therapy: Secondary | ICD-10-CM | POA: Diagnosis not present

## 2024-03-31 DIAGNOSIS — M0579 Rheumatoid arthritis with rheumatoid factor of multiple sites without organ or systems involvement: Secondary | ICD-10-CM | POA: Diagnosis not present

## 2024-03-31 DIAGNOSIS — Z79899 Other long term (current) drug therapy: Secondary | ICD-10-CM | POA: Diagnosis not present

## 2024-04-10 DIAGNOSIS — E890 Postprocedural hypothyroidism: Secondary | ICD-10-CM | POA: Diagnosis not present

## 2024-04-10 DIAGNOSIS — M810 Age-related osteoporosis without current pathological fracture: Secondary | ICD-10-CM | POA: Diagnosis not present
